# Patient Record
Sex: Male | Born: 1955 | Race: Black or African American | Hispanic: No | Marital: Single | Smoking: Current every day smoker
Health system: Southern US, Community
[De-identification: ages and names within clinical notes are randomized; demographics above are authoritative.]

## PROBLEM LIST (undated history)

## (undated) DIAGNOSIS — H269 Unspecified cataract: Secondary | ICD-10-CM

## (undated) DIAGNOSIS — R569 Unspecified convulsions: Secondary | ICD-10-CM

## (undated) DIAGNOSIS — C801 Malignant (primary) neoplasm, unspecified: Secondary | ICD-10-CM

## (undated) DIAGNOSIS — G4730 Sleep apnea, unspecified: Secondary | ICD-10-CM

## (undated) DIAGNOSIS — K219 Gastro-esophageal reflux disease without esophagitis: Secondary | ICD-10-CM

## (undated) DIAGNOSIS — C61 Malignant neoplasm of prostate: Secondary | ICD-10-CM

## (undated) DIAGNOSIS — E119 Type 2 diabetes mellitus without complications: Secondary | ICD-10-CM

## (undated) DIAGNOSIS — K439 Ventral hernia without obstruction or gangrene: Secondary | ICD-10-CM

## (undated) DIAGNOSIS — Z8711 Personal history of peptic ulcer disease: Secondary | ICD-10-CM

## (undated) DIAGNOSIS — I10 Essential (primary) hypertension: Secondary | ICD-10-CM

## (undated) DIAGNOSIS — F419 Anxiety disorder, unspecified: Secondary | ICD-10-CM

## (undated) DIAGNOSIS — I5032 Chronic diastolic (congestive) heart failure: Secondary | ICD-10-CM

## (undated) DIAGNOSIS — M1990 Unspecified osteoarthritis, unspecified site: Secondary | ICD-10-CM

## (undated) DIAGNOSIS — Z8719 Personal history of other diseases of the digestive system: Secondary | ICD-10-CM

## (undated) HISTORY — DX: Anxiety disorder, unspecified: F41.9

## (undated) HISTORY — DX: Unspecified cataract: H26.9

## (undated) HISTORY — PX: PROSTATE SURGERY: SHX751

## (undated) HISTORY — PX: EYE SURGERY: SHX253

## (undated) HISTORY — DX: Unspecified convulsions: R56.9

## (undated) HISTORY — DX: Malignant neoplasm of prostate: C61

## (undated) HISTORY — DX: Personal history of peptic ulcer disease: Z87.11

## (undated) HISTORY — PX: HERNIA REPAIR: SHX51

## (undated) HISTORY — DX: Ventral hernia without obstruction or gangrene: K43.9

## (undated) HISTORY — DX: Sleep apnea, unspecified: G47.30

## (undated) HISTORY — DX: Personal history of other diseases of the digestive system: Z87.19

---

## 2006-12-24 ENCOUNTER — Emergency Department: Payer: Self-pay | Admitting: Emergency Medicine

## 2007-04-28 ENCOUNTER — Emergency Department (HOSPITAL_COMMUNITY): Admission: EM | Admit: 2007-04-28 | Discharge: 2007-04-29 | Payer: Self-pay | Admitting: Emergency Medicine

## 2007-05-01 ENCOUNTER — Emergency Department (HOSPITAL_COMMUNITY): Admission: EM | Admit: 2007-05-01 | Discharge: 2007-05-02 | Payer: Self-pay | Admitting: Emergency Medicine

## 2007-05-01 ENCOUNTER — Emergency Department: Payer: Self-pay | Admitting: Emergency Medicine

## 2007-05-06 ENCOUNTER — Emergency Department: Payer: Self-pay | Admitting: Emergency Medicine

## 2007-05-08 ENCOUNTER — Emergency Department (HOSPITAL_COMMUNITY): Admission: EM | Admit: 2007-05-08 | Discharge: 2007-05-08 | Payer: Self-pay | Admitting: Emergency Medicine

## 2008-08-24 ENCOUNTER — Emergency Department (HOSPITAL_COMMUNITY): Admission: EM | Admit: 2008-08-24 | Discharge: 2008-08-24 | Payer: Self-pay | Admitting: Emergency Medicine

## 2008-08-26 ENCOUNTER — Emergency Department (HOSPITAL_COMMUNITY): Admission: EM | Admit: 2008-08-26 | Discharge: 2008-08-27 | Payer: Self-pay | Admitting: Emergency Medicine

## 2008-09-02 ENCOUNTER — Emergency Department: Payer: Self-pay | Admitting: Emergency Medicine

## 2008-10-05 ENCOUNTER — Emergency Department (HOSPITAL_COMMUNITY): Admission: EM | Admit: 2008-10-05 | Discharge: 2008-10-05 | Payer: Self-pay | Admitting: Emergency Medicine

## 2008-10-11 ENCOUNTER — Inpatient Hospital Stay: Payer: Self-pay | Admitting: Psychiatry

## 2011-08-16 LAB — DIFFERENTIAL
Basophils Absolute: 0
Basophils Absolute: 0
Basophils Relative: 0
Basophils Relative: 0
Eosinophils Absolute: 0.4
Eosinophils Absolute: 0.7
Eosinophils Relative: 11 — ABNORMAL HIGH
Eosinophils Relative: 4
Lymphocytes Relative: 22
Lymphs Abs: 1.3
Monocytes Absolute: 0.5
Monocytes Absolute: 0.7
Monocytes Relative: 8
Monocytes Relative: 8
Neutro Abs: 3.7
Neutrophils Relative %: 59

## 2011-08-16 LAB — CBC
HCT: 41.3
HCT: 42.7
Hemoglobin: 13.9
Hemoglobin: 14.4
MCHC: 33.8
MCHC: 33.8
MCV: 88.3
MCV: 89.2
Platelets: 177
RBC: 4.63
RBC: 4.84
RDW: 14.9
RDW: 15.2
WBC: 6.2

## 2011-08-16 LAB — POCT I-STAT, CHEM 8
BUN: 8
Calcium, Ion: 1.16
Chloride: 101
Creatinine, Ser: 1.2
Glucose, Bld: 81
HCT: 43
Hemoglobin: 14.6
Potassium: 3.7
Sodium: 140
TCO2: 30

## 2011-08-16 LAB — URINALYSIS, ROUTINE W REFLEX MICROSCOPIC
Glucose, UA: NEGATIVE
Hgb urine dipstick: NEGATIVE
Ketones, ur: 15 — AB
Nitrite: NEGATIVE
Protein, ur: NEGATIVE
Specific Gravity, Urine: 1.028
Urobilinogen, UA: 1
pH: 5.5

## 2011-08-16 LAB — RAPID URINE DRUG SCREEN, HOSP PERFORMED
Amphetamines: NOT DETECTED
Barbiturates: NOT DETECTED
Benzodiazepines: NOT DETECTED
Cocaine: POSITIVE — AB
Cocaine: POSITIVE — AB
Opiates: POSITIVE — AB
Tetrahydrocannabinol: POSITIVE — AB
Tetrahydrocannabinol: POSITIVE — AB

## 2011-08-16 LAB — URINE MICROSCOPIC-ADD ON

## 2011-08-16 LAB — BASIC METABOLIC PANEL
CO2: 31
Chloride: 101
GFR calc Af Amer: >60
Glucose, Bld: 89
Sodium: 140

## 2011-08-16 LAB — ETHANOL: Alcohol, Ethyl (B): 30 — ABNORMAL HIGH

## 2011-08-17 LAB — DIFFERENTIAL
Basophils Relative: 0
Eosinophils Absolute: 0.3
Eosinophils Relative: 5
Lymphs Abs: 2.1

## 2011-08-17 LAB — POCT I-STAT, CHEM 8
BUN: 7
Creatinine, Ser: 1.3
Glucose, Bld: 87
Potassium: 4
Sodium: 141
TCO2: 34

## 2011-08-17 LAB — RAPID URINE DRUG SCREEN, HOSP PERFORMED
Amphetamines: NOT DETECTED
Barbiturates: NOT DETECTED
Benzodiazepines: NOT DETECTED
Cocaine: POSITIVE — AB
Opiates: POSITIVE — AB

## 2011-08-17 LAB — CBC
HCT: 43.1
MCHC: 34.1
MCV: 87.7
Platelets: 164
WBC: 6.6

## 2011-08-17 LAB — CK TOTAL AND CKMB (NOT AT ARMC): Total CK: 633 — ABNORMAL HIGH

## 2011-09-01 LAB — I-STAT 8, (EC8 V) (CONVERTED LAB)
Acid-Base Excess: 4 — ABNORMAL HIGH
Acid-Base Excess: 6 — ABNORMAL HIGH
BUN: 14
BUN: 9
Bicarbonate: 31.7 — ABNORMAL HIGH
Bicarbonate: 34.3 — ABNORMAL HIGH
Chloride: 104
Chloride: 106
Glucose, Bld: 76
Glucose, Bld: 86
HCT: 35 — ABNORMAL LOW
HCT: 42
Hemoglobin: 11.9 — ABNORMAL LOW
Hemoglobin: 14.3
Operator id: 277751
Operator id: 277751
Potassium: 3.7
Potassium: 4.1
Sodium: 140
Sodium: 142
TCO2: 34
TCO2: 36
pCO2, Ven: 64.1 — ABNORMAL HIGH
pCO2, Ven: 66.9 — ABNORMAL HIGH
pH, Ven: 7.303 — ABNORMAL HIGH
pH, Ven: 7.318 — ABNORMAL HIGH

## 2011-09-01 LAB — HEPATIC FUNCTION PANEL
ALT: 182 — ABNORMAL HIGH
AST: 113 — ABNORMAL HIGH
Albumin: 3.4 — ABNORMAL LOW
Alkaline Phosphatase: 119 — ABNORMAL HIGH
Bilirubin, Direct: 0.4 — ABNORMAL HIGH
Indirect Bilirubin: 0.8
Total Bilirubin: 1.2
Total Protein: 7.2

## 2011-09-01 LAB — CBC
HCT: 34.2 — ABNORMAL LOW
HCT: 40.8
Hemoglobin: 13.4
MCHC: 32.9
MCV: 87.1
MCV: 87.7
Platelets: 170
RBC: 3.92 — ABNORMAL LOW
RBC: 4.65
RDW: 15 — ABNORMAL HIGH
WBC: 4.7
WBC: 4.9

## 2011-09-01 LAB — RAPID URINE DRUG SCREEN, HOSP PERFORMED
Amphetamines: NOT DETECTED
Amphetamines: NOT DETECTED
Barbiturates: NOT DETECTED
Benzodiazepines: POSITIVE — AB
Cocaine: NOT DETECTED
Opiates: POSITIVE — AB
Tetrahydrocannabinol: POSITIVE — AB
Tetrahydrocannabinol: POSITIVE — AB

## 2011-09-01 LAB — DIFFERENTIAL
Basophils Absolute: 0
Basophils Relative: 1
Eosinophils Absolute: 0.3
Eosinophils Absolute: 0.5
Eosinophils Relative: 10 — ABNORMAL HIGH
Eosinophils Relative: 6 — ABNORMAL HIGH
Lymphocytes Relative: 41
Lymphs Abs: 1.8
Lymphs Abs: 1.9
Monocytes Absolute: 0.4
Monocytes Absolute: 0.5
Monocytes Relative: 12 — ABNORMAL HIGH
Monocytes Relative: 8
Neutro Abs: 1.9
Neutrophils Relative %: 40 — ABNORMAL LOW

## 2011-09-01 LAB — SALICYLATE LEVEL: Salicylate Lvl: <4

## 2011-09-01 LAB — POCT I-STAT CREATININE
Creatinine, Ser: 1.1
Operator id: 277751

## 2011-09-01 LAB — TRICYCLICS SCREEN, URINE: TCA Scrn: NOT DETECTED

## 2011-09-01 LAB — ETHANOL: Alcohol, Ethyl (B): <5

## 2011-09-02 LAB — BASIC METABOLIC PANEL
CO2: 30
Calcium: 9.5
Chloride: 108
Glucose, Bld: 92
Sodium: 144

## 2011-09-02 LAB — DIFFERENTIAL
Basophils Absolute: 0
Basophils Relative: 1
Eosinophils Absolute: 0.5
Eosinophils Relative: 7 — ABNORMAL HIGH
Monocytes Absolute: 0.6
Neutro Abs: 4

## 2011-09-02 LAB — CBC
Hemoglobin: 12.9 — ABNORMAL LOW
MCHC: 33.5
MCV: 86.1
RDW: 14.4 — ABNORMAL HIGH

## 2011-09-02 LAB — URINALYSIS, ROUTINE W REFLEX MICROSCOPIC
Glucose, UA: NEGATIVE
Nitrite: NEGATIVE
Specific Gravity, Urine: 1.023
pH: 5.5

## 2011-09-02 LAB — RAPID URINE DRUG SCREEN, HOSP PERFORMED
Cocaine: NOT DETECTED
Tetrahydrocannabinol: POSITIVE — AB

## 2013-11-15 DIAGNOSIS — K439 Ventral hernia without obstruction or gangrene: Secondary | ICD-10-CM

## 2013-11-15 HISTORY — DX: Ventral hernia without obstruction or gangrene: K43.9

## 2014-11-15 HISTORY — PX: PROSTATE SURGERY: SHX751

## 2017-09-12 ENCOUNTER — Emergency Department: Payer: Self-pay

## 2017-09-12 ENCOUNTER — Emergency Department
Admission: EM | Admit: 2017-09-12 | Discharge: 2017-09-12 | Disposition: A | Discharge status: Home / Self Care | Payer: Self-pay | Attending: Emergency Medicine | Admitting: Emergency Medicine

## 2017-09-12 ENCOUNTER — Encounter: Payer: Self-pay | Admitting: Emergency Medicine

## 2017-09-12 DIAGNOSIS — Y929 Unspecified place or not applicable: Secondary | ICD-10-CM | POA: Insufficient documentation

## 2017-09-12 DIAGNOSIS — I10 Essential (primary) hypertension: Secondary | ICD-10-CM | POA: Insufficient documentation

## 2017-09-12 DIAGNOSIS — S8002XA Contusion of left knee, initial encounter: Secondary | ICD-10-CM | POA: Insufficient documentation

## 2017-09-12 DIAGNOSIS — Z859 Personal history of malignant neoplasm, unspecified: Secondary | ICD-10-CM | POA: Insufficient documentation

## 2017-09-12 DIAGNOSIS — Y939 Activity, unspecified: Secondary | ICD-10-CM | POA: Insufficient documentation

## 2017-09-12 DIAGNOSIS — F17200 Nicotine dependence, unspecified, uncomplicated: Secondary | ICD-10-CM | POA: Insufficient documentation

## 2017-09-12 DIAGNOSIS — M25562 Pain in left knee: Secondary | ICD-10-CM

## 2017-09-12 DIAGNOSIS — M25561 Pain in right knee: Secondary | ICD-10-CM

## 2017-09-12 DIAGNOSIS — W19XXXA Unspecified fall, initial encounter: Secondary | ICD-10-CM | POA: Insufficient documentation

## 2017-09-12 DIAGNOSIS — Y999 Unspecified external cause status: Secondary | ICD-10-CM | POA: Insufficient documentation

## 2017-09-12 HISTORY — DX: Essential (primary) hypertension: I10

## 2017-09-12 HISTORY — DX: Malignant (primary) neoplasm, unspecified: C80.1

## 2017-09-12 MED ORDER — TRAMADOL HCL 50 MG PO TABS
50.0000 mg | ORAL_TABLET | Freq: Once | ORAL | Status: AC
  Administered 2017-09-12: 50 mg via ORAL
  Filled 2017-09-12: qty 1

## 2017-09-12 MED ORDER — TRAMADOL HCL 50 MG PO TABS: 50.0000 mg | ORAL_TABLET | Freq: Four times a day (QID) | ORAL | 0 refills | Status: AC | PRN

## 2017-09-12 NOTE — ED Notes (Signed)
Pt to ed with c/o left lower leg pain. Pt states on Saturday he tripped and fell onto sidewalk and twisted left knee.  Reports since increased pain with ambulation, swelling to back of calf.

## 2017-09-12 NOTE — ED Provider Notes (Signed)
[facility] Emergency Department Provider Note    First MD Initiated Contact with Patient 09/12/17 0915     (approximate)  I have reviewed the triage vital signs and the nursing notes.   HISTORY  Chief Complaint Knee Pain    HPI Alexander Avery is a 61 y.o. male with blow list of chronic medical conditions presents to the emergency department with left lateral knee pain status post accidental fall on Saturday where the patient fell onto his left knee. Patient states his current pain score is 9 out of 10 worse with any movement.   Past Medical History:  Diagnosis Date  . Cancer ([place])   . Hypertension     There are no active problems to display for this patient.   Past Surgical History:  Procedure Laterality Date  . HERNIA REPAIR      Prior to Admission medications   Not on File    Allergies No known drug allergies No family history on file.  Social History Social History  Substance Use Topics  . Smoking status: Current Some Day Smoker  . Smokeless tobacco: Never Used  . Alcohol use Yes    Review of Systems Constitutional: No fever/chills Eyes: No visual changes. ENT: No sore throat. Cardiovascular: Denies chest pain. Respiratory: Denies shortness of breath. Gastrointestinal: No abdominal pain.  No nausea, no vomiting.  No diarrhea.  No constipation. Genitourinary: Negative for dysuria. Musculoskeletal: Negative for neck pain.  Negative for back pain. Positive for left knee pain Integumentary: Negative for rash. Neurological: Negative for headaches, focal weakness or numbness.   ____________________________________________   PHYSICAL EXAM:  VITAL SIGNS: ED Triage Vitals  Enc Vitals Group     BP 09/12/17 0835 (!) 197/88     Pulse Rate 09/12/17 0835 72     Resp 09/12/17 0835 20     Temp 09/12/17 0835 97.8 F (36.6 C)     Temp Source 09/12/17 0835 Oral     SpO2 09/12/17 0835 99 %     Weight 09/12/17 0837 57.2 kg (126  lb)     Height --      Head Circumference --      Peak Flow --      Pain Score 09/12/17 0835 9     Pain Loc --      Pain Edu? --      Excl. in [place]? --     Constitutional: Alert and oriented. Well appearing and in no acute distress. Eyes: Conjunctivae are normal.  Head: Atraumatic. Mouth/Throat: Mucous membranes are moist.  Neck: No stridor.   Musculoskeletal: Pain swelling with palpation of the left fibular head, pain active and passive range of motion of the left knee. Neurologic:  Normal speech and language. No gross focal neurologic deficits are appreciated.  Skin:  Skin is warm, dry and intact. No rash noted. Psychiatric: Mood and affect are normal. Speech and behavior are normal.  ____________________________________________   RADIOLOGY I, Ponce N BROWN, personally viewed and evaluated these images (plain radiographs) as part of my medical decision making, as well as reviewing the written report by the radiologist.  Final result by Inez Catalina, MD (09/12/17 10:43:19)           Narrative:   CLINICAL DATA: Recent fall with left knee pain, initial encounter  EXAM: LEFT KNEE - COMPLETE 4+ VIEW  COMPARISON: None.  FINDINGS: No acute fracture or dislocation is noted. Some slight increased density in the infrapatellar fat pad is noted  which may be related to internal derangement. Vascular calcifications are seen.  IMPRESSION: No acute bony abnormality is seen.   Electronically Signed By: Inez Catalina M.D. On: 09/12/2017 10:43           Procedures   ____________________________________________   INITIAL IMPRESSION / ASSESSMENT AND PLAN / ED COURSE  As part of my medical decision making, I reviewed the following data within the electronic MEDICAL RECORD [id number] year old male presenting with above stated history and physical exam. X-ray revealed no acute bony abnormality. Concern for possible ligamentous injury and a such a knee immobilizer placed on  the patient crutches given patient given tramadol emergency department for pain will be prescribed same for home. Patient referred to Dr. Marry Guan orthopedic surgeon for further outpatient evaluation for possible ligamentous injury.     ____________________________________________  FINAL CLINICAL IMPRESSION(S) / ED DIAGNOSES  Final diagnoses:  Acute pain of right knee  Contusion of left knee, initial encounter  Acute pain of left knee     MEDICATIONS GIVEN DURING THIS VISIT:  Medications - No data to display   NEW OUTPATIENT MEDICATIONS STARTED DURING THIS VISIT:  New Prescriptions   No medications on file    Modified Medications   No medications on file    Discontinued Medications   No medications on file     Note:  This document was prepared using Dragon voice recognition software and may include unintentional dictation errors.    Gregor Hams, MD 09/14/17 [phone]

## 2017-09-12 NOTE — ED Triage Notes (Signed)
Pt presents with left knee pain after missing a step on Saturday and landing on his left knee.

## 2017-11-15 DIAGNOSIS — H269 Unspecified cataract: Secondary | ICD-10-CM

## 2017-11-15 HISTORY — DX: Unspecified cataract: H26.9

## 2018-03-28 ENCOUNTER — Encounter: Payer: Self-pay | Admitting: *Deleted

## 2018-03-30 ENCOUNTER — Ambulatory Visit
Admission: RE | Admit: 2018-03-30 | Discharge: 2018-03-30 | Disposition: A | Discharge status: Home / Self Care | Payer: Medicare Other | Source: Ambulatory Visit | Attending: Ophthalmology | Admitting: Ophthalmology

## 2018-03-30 ENCOUNTER — Encounter
Admission: RE | Disposition: A | Discharge status: Home / Self Care | Payer: Self-pay | Source: Ambulatory Visit | Attending: Ophthalmology

## 2018-03-30 ENCOUNTER — Ambulatory Visit: Payer: Medicare Other | Admitting: Registered Nurse

## 2018-03-30 DIAGNOSIS — Z7984 Long term (current) use of oral hypoglycemic drugs: Secondary | ICD-10-CM | POA: Diagnosis not present

## 2018-03-30 DIAGNOSIS — Z79899 Other long term (current) drug therapy: Secondary | ICD-10-CM | POA: Diagnosis not present

## 2018-03-30 DIAGNOSIS — K219 Gastro-esophageal reflux disease without esophagitis: Secondary | ICD-10-CM | POA: Insufficient documentation

## 2018-03-30 DIAGNOSIS — I10 Essential (primary) hypertension: Secondary | ICD-10-CM | POA: Insufficient documentation

## 2018-03-30 DIAGNOSIS — H2511 Age-related nuclear cataract, right eye: Secondary | ICD-10-CM | POA: Insufficient documentation

## 2018-03-30 DIAGNOSIS — Z8546 Personal history of malignant neoplasm of prostate: Secondary | ICD-10-CM | POA: Insufficient documentation

## 2018-03-30 DIAGNOSIS — F17200 Nicotine dependence, unspecified, uncomplicated: Secondary | ICD-10-CM | POA: Diagnosis not present

## 2018-03-30 DIAGNOSIS — E1136 Type 2 diabetes mellitus with diabetic cataract: Secondary | ICD-10-CM | POA: Diagnosis present

## 2018-03-30 DIAGNOSIS — H2521 Age-related cataract, morgagnian type, right eye: Secondary | ICD-10-CM | POA: Diagnosis not present

## 2018-03-30 HISTORY — DX: Gastro-esophageal reflux disease without esophagitis: K21.9

## 2018-03-30 HISTORY — PX: CATARACT EXTRACTION W/PHACO: SHX586

## 2018-03-30 HISTORY — DX: Unspecified osteoarthritis, unspecified site: M19.90

## 2018-03-30 HISTORY — DX: Type 2 diabetes mellitus without complications: E11.9

## 2018-03-30 LAB — GLUCOSE, CAPILLARY: GLUCOSE-CAPILLARY: 94 mg/dL (ref 65–99)

## 2018-03-30 SURGERY — PHACOEMULSIFICATION, CATARACT, WITH IOL INSERTION
Anesthesia: Monitor Anesthesia Care | Site: Eye | Laterality: Right | Wound class: Clean

## 2018-03-30 MED ORDER — NA CHONDROIT SULF-NA HYALURON 40-30 MG/ML IO SOLN
INTRAOCULAR | Status: DC | PRN
  Administered 2018-03-30 (×2): 0.5 mL via INTRAOCULAR

## 2018-03-30 MED ORDER — FLUORESCEIN SODIUM 1 MG OP STRP
ORAL_STRIP | OPHTHALMIC | Status: DC | PRN
  Administered 2018-03-30: 1 via OPHTHALMIC

## 2018-03-30 MED ORDER — POVIDONE-IODINE 5 % OP SOLN
OPHTHALMIC | Status: AC
  Filled 2018-03-30: qty 30

## 2018-03-30 MED ORDER — SODIUM HYALURONATE 10 MG/ML IO SOLN
INTRAOCULAR | Status: DC | PRN
  Administered 2018-03-30: 0.55 mL via INTRAOCULAR

## 2018-03-30 MED ORDER — NEOMYCIN-POLYMYXIN-DEXAMETH 3.5-10000-0.1 OP OINT
TOPICAL_OINTMENT | OPHTHALMIC | Status: AC
  Filled 2018-03-30: qty 3.5

## 2018-03-30 MED ORDER — BSS IO SOLN
INTRAOCULAR | Status: DC | PRN
  Administered 2018-03-30: 4 mL via OPHTHALMIC

## 2018-03-30 MED ORDER — TRIAMCINOLONE ACETONIDE 40 MG/ML IJ SUSP
INTRAMUSCULAR | Status: AC
  Filled 2018-03-30: qty 1

## 2018-03-30 MED ORDER — NEOMYCIN-POLYMYXIN-DEXAMETH 3.5-10000-0.1 OP OINT
TOPICAL_OINTMENT | OPHTHALMIC | Status: DC | PRN
  Administered 2018-03-30: 1 via OPHTHALMIC

## 2018-03-30 MED ORDER — EPINEPHRINE PF 1 MG/ML IJ SOLN
INTRAMUSCULAR | Status: AC
  Filled 2018-03-30: qty 2

## 2018-03-30 MED ORDER — POVIDONE-IODINE 5 % OP SOLN
OPHTHALMIC | Status: DC | PRN
  Administered 2018-03-30: 1 via OPHTHALMIC

## 2018-03-30 MED ORDER — HYALURONIDASE HUMAN 150 UNIT/ML IJ SOLN
INTRAMUSCULAR | Status: AC
  Filled 2018-03-30: qty 1

## 2018-03-30 MED ORDER — ARMC OPHTHALMIC DILATING DROPS
1.0000 "application " | OPHTHALMIC | Status: AC
  Administered 2018-03-30 (×3): 1 via OPHTHALMIC

## 2018-03-30 MED ORDER — SODIUM CHLORIDE 0.9 % IV SOLN
INTRAVENOUS | Status: DC
  Administered 2018-03-30: 07:00:00 via INTRAVENOUS

## 2018-03-30 MED ORDER — MIDAZOLAM HCL 2 MG/2ML IJ SOLN
INTRAMUSCULAR | Status: AC
Start: 2018-03-30 — End: ?
  Filled 2018-03-30: qty 2

## 2018-03-30 MED ORDER — LIDOCAINE HCL (PF) 4 % IJ SOLN
INTRAMUSCULAR | Status: AC
  Filled 2018-03-30: qty 5

## 2018-03-30 MED ORDER — ARMC OPHTHALMIC DILATING DROPS
OPHTHALMIC | Status: AC
  Administered 2018-03-30: 07:00:00
  Filled 2018-03-30: qty 0.4

## 2018-03-30 MED ORDER — LIDOCAINE HCL (PF) 4 % IJ SOLN
INTRAMUSCULAR | Status: DC | PRN
  Administered 2018-03-30: 08:00:00

## 2018-03-30 MED ORDER — TRIAMCINOLONE ACETONIDE 40 MG/ML IJ SUSP
INTRAMUSCULAR | Status: DC | PRN
  Administered 2018-03-30: 40 mg via INTRAMUSCULAR

## 2018-03-30 MED ORDER — SODIUM HYALURONATE 23 MG/ML IO SOLN
INTRAOCULAR | Status: AC
  Filled 2018-03-30: qty 0.6

## 2018-03-30 MED ORDER — TRYPAN BLUE 0.06 % OP SOLN
OPHTHALMIC | Status: DC | PRN
  Administered 2018-03-30: 0.5 mL via INTRAOCULAR

## 2018-03-30 MED ORDER — EPINEPHRINE PF 1 MG/ML IJ SOLN
INTRAOCULAR | Status: DC | PRN
  Administered 2018-03-30: 08:00:00 via OPHTHALMIC

## 2018-03-30 MED ORDER — ONDANSETRON HCL 4 MG/2ML IJ SOLN
INTRAMUSCULAR | Status: DC | PRN
  Administered 2018-03-30: 4 mg via INTRAVENOUS

## 2018-03-30 MED ORDER — ALFENTANIL 500 MCG/ML IJ INJ
INJECTION | INTRAVENOUS | Status: DC | PRN
  Administered 2018-03-30 (×2): 250 ug via INTRAVENOUS

## 2018-03-30 MED ORDER — BSS IO SOLN
INTRAOCULAR | Status: DC | PRN
  Administered 2018-03-30 (×2): 15 mL via INTRAOCULAR

## 2018-03-30 MED ORDER — MIDAZOLAM HCL 2 MG/2ML IJ SOLN
INTRAMUSCULAR | Status: DC | PRN
  Administered 2018-03-30: 2 mg via INTRAVENOUS

## 2018-03-30 MED ORDER — BUPIVACAINE HCL (PF) 0.75 % IJ SOLN
INTRAMUSCULAR | Status: AC
  Filled 2018-03-30: qty 10

## 2018-03-30 MED ORDER — FLUORESCEIN SODIUM 0.6 MG OP STRP
ORAL_STRIP | OPHTHALMIC | Status: AC
  Filled 2018-03-30: qty 1

## 2018-03-30 MED ORDER — FENTANYL CITRATE (PF) 100 MCG/2ML IJ SOLN
INTRAMUSCULAR | Status: AC
Start: 2018-03-30 — End: ?
  Filled 2018-03-30: qty 2

## 2018-03-30 MED ORDER — MOXIFLOXACIN HCL 0.5 % OP SOLN
OPHTHALMIC | Status: AC
  Filled 2018-03-30: qty 3

## 2018-03-30 MED ORDER — SODIUM HYALURONATE 23 MG/ML IO SOLN
INTRAOCULAR | Status: DC | PRN
  Administered 2018-03-30: 0.6 mL via INTRAOCULAR

## 2018-03-30 MED ORDER — CARBACHOL 0.01 % IO SOLN
INTRAOCULAR | Status: DC | PRN
  Administered 2018-03-30: 0.5 mL via INTRAOCULAR

## 2018-03-30 MED ORDER — PROPOFOL 10 MG/ML IV BOLUS
INTRAVENOUS | Status: AC
  Filled 2018-03-30: qty 20

## 2018-03-30 MED ORDER — TRYPAN BLUE 0.06 % OP SOLN
OPHTHALMIC | Status: AC
Start: 2018-03-30 — End: ?
  Filled 2018-03-30: qty 0.5

## 2018-03-30 SURGICAL SUPPLY — 31 items
BANDAGE EYE OVAL (MISCELLANEOUS) ×2 IMPLANT
CANNULA ANT/CHMB 27G (MISCELLANEOUS) IMPLANT
CANNULA ANT/CHMB 27GA (MISCELLANEOUS) ×4 IMPLANT
DISSECTOR HYDRO NUCLEUS 50X22 (MISCELLANEOUS) ×2 IMPLANT
FILTER MILLEX .045 (MISCELLANEOUS) IMPLANT
FLTR MILLEX .045 (MISCELLANEOUS) ×2
GLIDE IOL SHEET 35MLX5ML (MISCELLANEOUS) ×1 IMPLANT
GLOVE BIO SURGEON STRL SZ8 (GLOVE) ×2 IMPLANT
GLOVE BIOGEL M 6.5 STRL (GLOVE) ×2 IMPLANT
GLOVE SURG LX 7.5 STRW (GLOVE) ×1
GLOVE SURG LX STRL 7.5 STRW (GLOVE) ×1 IMPLANT
GOWN STRL REUS W/ TWL LRG LVL3 (GOWN DISPOSABLE) ×2 IMPLANT
GOWN STRL REUS W/TWL LRG LVL3 (GOWN DISPOSABLE) ×4
LABEL CATARACT MEDS ST (LABEL) ×2 IMPLANT
LENS IOL +15.5 DIOP 13X5.5X (Intraocular Lens) ×1 IMPLANT
LENS IOL KELMAN MF 15.5 (Intraocular Lens) IMPLANT
LENS IOL KELMAN MULTIFLEX 15.5 (Intraocular Lens) ×2 IMPLANT
LENS IOL TECNIS ITEC 20.0 (Intraocular Lens) IMPLANT
MARKER SKIN DUAL TIP RULER LAB (MISCELLANEOUS) ×1 IMPLANT
NDL SAFETY ECLIPSE 18X1.5 (NEEDLE) IMPLANT
NEEDLE HYPO 18GX1.5 SHARP (NEEDLE) ×2
PACK CATARACT (MISCELLANEOUS) ×2 IMPLANT
PACK CATARACT KING (MISCELLANEOUS) ×2 IMPLANT
PACK EYE AFTER SURG (MISCELLANEOUS) ×2 IMPLANT
PACK VIT ANT 23G (MISCELLANEOUS) ×1 IMPLANT
SOL BSS BAG (MISCELLANEOUS) ×2
SOLUTION BSS BAG (MISCELLANEOUS) ×1 IMPLANT
SUT ETHILON 10 0 CS140 6 (SUTURE) ×1 IMPLANT
SYR 3ML LL SCALE MARK (SYRINGE) ×2 IMPLANT
WATER STERILE IRR 250ML POUR (IV SOLUTION) ×2 IMPLANT
WIPE NON LINTING 3.25X3.25 (MISCELLANEOUS) ×2 IMPLANT

## 2018-03-30 NOTE — Anesthesia Post-op Follow-up Note (Signed)
Anesthesia QCDR form completed.        

## 2018-03-30 NOTE — Addendum Note (Signed)
Addendum  created 03/30/18 0906 by Doreen Salvage, CRNA   Child order released for a procedure order, Intraprocedure Blocks edited, Sign clinical note

## 2018-03-30 NOTE — Transfer of Care (Signed)
Immediate Anesthesia Transfer of Care Note  Patient: ISHAN SANROMAN  Procedure(s) Performed: Procedure(s) with comments: CATARACT EXTRACTION PHACO AND INTRAOCULAR LENS PLACEMENT (IOC) (Right) - [place] 01:09.5 AP% 10.4 CDE 7.98 Fluid pack lot # [id number]  Patient Location: PHASE II  Anesthesia Type:MAC  Level of Consciousness: Awake, Alert, Oriented  Airway & Oxygen Therapy: Patient Spontanous Breathing and Patient on room air   Post-op Assessment: Report given to RN and Post -op Vital signs reviewed and stable  Post vital signs: Reviewed and stable  Last Vitals:  Vitals:   03/30/18 0633 03/30/18 0857  BP: (!) 167/87 (!) 148/83  Pulse: (!) 55 (!) 58  Resp: 18 18  Temp: 36.5 C 36.6 C  SpO2: 015% 615%    Complications: No apparent anesthesia complications

## 2018-03-30 NOTE — Op Note (Signed)
OPERATIVE NOTE  CREE KUNERT [id number] 03/30/2018   PREOPERATIVE DIAGNOSIS:  Nuclear sclerotic cataract right eye.  H25.11, Morgagnian cataract right eye.   POSTOPERATIVE DIAGNOSIS:    Nuclear sclerotic cataract right eye.     PROCEDURE:   1.  Phacoemusification with anterior chamber intraocular lens placement of the right eye  2.  Anterior vitrectomy.    LENS:   Implant Name Type Inv. Item Serial No. Manufacturer Lot No. LRB No. Used  LENS IOL DIOP 20.0 - [id number] Intraocular Lens LENS IOL DIOP 20.0 [id number]  Right 1  LENS IOL ANT DIOP 15.5 - [id number] Intraocular Lens LENS IOL ANT DIOP 15.5 [id number] ALCON  Right 1       The MTA 4UO anterior chamber lens was used.  Not the AMO lens.   ULTRASOUND TIME: 1 minutes 09 seconds.  CDE 7.98   SURGEON:  Benay Pillow, MD, MPH  ANESTHESIOLOGIST: Anesthesiologist: Martha Clan, MD CRNA: Doreen Salvage, CRNA   ANESTHESIA:  MAC, retrobulbar block, topical with tetracaine drops augmented with 1% preservative-free intracameral lidocaine.  ESTIMATED BLOOD LOSS: less than 1 mL.   COMPLICATIONS:  Anterior and posterior capsular tear.   DESCRIPTION OF PROCEDURE:  The patient was identified in the holding room and transported to the operating room and placed in the supine position under the operating microscope.    A retrobulbar block was performed in the standard fashion with 3.5 cc.  The right eye was identified as the operative eye and it was prepped and draped in the usual sterile ophthalmic fashion.   A 1.0 millimeter clear-corneal paracentesis was made at the 10:30 position.   The cataract was opaque so trypan blue was utilized in the standard fashion to visualize the capsule.  0.5 ml of preservative-free 1% lidocaine with epinephrine was injected into the anterior chamber.  The anterior chamber was filled with Healon 5 viscoelastic.  A 2.4 millimeter keratome was used to make a near-clear corneal incision at  the 8:00 position.  A curvilinear capsulorrhexis was made with a cystotome and capsulorrhexis forceps.  Balanced salt solution was used to hydrodissect and hydrodelineate the nucleus.  The nucleus was noted to be very mobile, but the anterior capsule remained in good position, the posterior capsule was notably mobile.    Phacoemulsification was then used in stop and chop fashion to remove the lens nucleus and epinucleus.    A posterior and anterior capsule tear was suddenly noted as well as a zonular dehiscence inferiorly.  Viscoat was injected to tamponade the vitreous.  The rest of the lens was removed using I/A and phaco.   An anterior vitrectomy was performed, with kenalog used to stain the vitreous.  The vitrector was used to create an iridotomy.  The temporal wound was enlarged to 5 mm.   An anterior chamber lens was placed.   Four simple interrupted 10-0 nylon sutures were placed.   The OVD was rinsed out with BSS.   Intracameral vigamox 0.1 mL undiluted was injected into the eye and a drop placed onto the ocular surface.  No wound leaks were noted, this was verified with a fluorescein strip.   The patient was taken to the recovery room in stable condition.    Benay Pillow 03/30/2018, 8:57 AM

## 2018-03-30 NOTE — Addendum Note (Signed)
Addendum  created 03/30/18 0911 by Martha Clan, MD   Intraprocedure Event edited

## 2018-03-30 NOTE — Anesthesia Preprocedure Evaluation (Signed)
Anesthesia Evaluation  Patient identified by MRN, date of birth, ID band Patient awake    Reviewed: Allergy & Precautions, H&P , NPO status , Patient's Chart, lab work & pertinent test results, reviewed documented beta blocker date and time   History of Anesthesia Complications Negative for: history of anesthetic complications  Airway Mallampati: I  TM Distance: >3 FB Neck ROM: full    Dental  (+) Dental Advidsory Given, Teeth Intact, Poor Dentition, Missing   Pulmonary neg shortness of breath, neg sleep apnea, neg COPD, neg recent URI, Current Smoker,           Cardiovascular Exercise Tolerance: Good hypertension, (-) angina(-) CAD, (-) Past MI, (-) Cardiac Stents and (-) CABG (-) dysrhythmias (-) Valvular Problems/Murmurs     Neuro/Psych negative neurological ROS  negative psych ROS   GI/Hepatic Neg liver ROS, GERD  ,  Endo/Other  diabetes, Type 2, Oral Hypoglycemic Agents  Renal/GU negative Renal ROS  negative genitourinary   Musculoskeletal   Abdominal   Peds  Hematology negative hematology ROS (+)   Anesthesia Other Findings Past Medical History: No date: Arthritis No date: Cancer (HCC) No date: Diabetes mellitus without complication (HCC) No date: GERD (gastroesophageal reflux disease) No date: Hypertension   Reproductive/Obstetrics negative OB ROS                             Anesthesia Physical Anesthesia Plan  ASA: II  Anesthesia Plan: MAC   Post-op Pain Management:    Induction:   PONV Risk Score and Plan: 1 and Midazolam, Ondansetron and Dexamethasone  Airway Management Planned:   Additional Equipment:   Intra-op Plan:   Post-operative Plan:   Informed Consent: I have reviewed the patients History and Physical, chart, labs and discussed the procedure including the risks, benefits and alternatives for the proposed anesthesia with the patient or authorized  representative who has indicated his/her understanding and acceptance.   Dental Advisory Given  Plan Discussed with: Anesthesiologist, CRNA and Surgeon  Anesthesia Plan Comments:         Anesthesia Quick Evaluation

## 2018-03-30 NOTE — H&P (Signed)
The History and Physical notes are on paper, have been signed, and are to be scanned.   I have examined the patient and there are no changes to the H&P.   Benay Pillow 03/30/2018 7:19 AM

## 2018-03-30 NOTE — OR Nursing (Signed)
Discussed discharge instructions with pt and wife. Both voice understanding. 

## 2018-03-30 NOTE — Anesthesia Postprocedure Evaluation (Signed)
Anesthesia Post Note  Patient: Alexander Avery  Procedure(s) Performed: CATARACT EXTRACTION PHACO AND INTRAOCULAR LENS PLACEMENT (IOC) (Right Eye)  Patient location during evaluation: PACU Anesthesia Type: MAC Level of consciousness: awake and alert Pain management: pain level controlled Vital Signs Assessment: post-procedure vital signs reviewed and stable Respiratory status: spontaneous breathing, nonlabored ventilation, respiratory function stable and patient connected to nasal cannula oxygen Cardiovascular status: stable and blood pressure returned to baseline Postop Assessment: no apparent nausea or vomiting Anesthetic complications: no     Last Vitals:  Vitals:   03/30/18 0633 03/30/18 0857  BP: (!) 167/87 (!) 148/83  Pulse: (!) 55 (!) 58  Resp: 18 18  Temp: 36.5 C 36.6 C  SpO2: 100% 100%    Last Pain:  Vitals:   03/30/18 0857  TempSrc: Oral  PainSc:                  Alison Stalling

## 2018-03-30 NOTE — Discharge Instructions (Addendum)
FOLLOW DR. Melony Overly POSTOP EYE DROP INSTRUCTION SHEET AS REVIEWED.  Eye Surgery Discharge Instructions  Expect mild scratchy sensation or mild soreness. DO NOT RUB YOUR EYE!  The day of surgery:  Minimal physical activity, but bed rest is not required  No reading, computer work, or close hand work  No bending, lifting, or straining.  May watch TV  For 24 hours:  No driving, legal decisions, or alcoholic beverages  Safety precautions  Eat anything you prefer: It is better to start with liquids, then soup then solid foods.  Keep eye patch on until follow up appt tomorrow - take eye drop instruction sheet with you to follow up appt (Vigamox in bag)  Resume all regular medications including aspirin or Coumadin if these were discontinued prior to surgery. You may shower, bathe, shave, or wash your hair. Tylenol may be taken for mild discomfort.  Call your doctor if you experience significant pain, nausea, or vomiting, fever > 101 or other signs of infection. [phone] or [phone] Specific instructions:  Follow-up Information    Eulogio Bear, MD Follow up.   Specialty:  Ophthalmology Why:  as previously scheduled [facility] OFFICE Contact information: [place] [address] [phone]

## 2018-03-30 NOTE — Anesthesia Procedure Notes (Signed)
Date/Time: 03/30/2018 7:37 AM Performed by: Doreen Salvage, CRNA Pre-anesthesia Checklist: Patient identified, Emergency Drugs available, Suction available and Patient being monitored Patient Re-evaluated:Patient Re-evaluated prior to induction Oxygen Delivery Method: Nasal cannula Induction Type: IV induction Dental Injury: Teeth and Oropharynx as per pre-operative assessment  Comments: Nasal cannula with etCO2 monitoring

## 2018-06-15 NOTE — Pre-Procedure Instructions (Addendum)
VERIFIED PATIENT'S CORRECT FIRST NAME IS Alexander Avery. LM FOR [facility], NEED ORDERS, H/P WITH CORRECTED NAME. SPOKE WITH SUPERVISOR, ROB WHO CONFIRMED WILL NEED ORDERS/ H/P WITH CORRECT LEGAL FIRST NAME  TELEPHONE CALL FROM [facility]. STATES PATIENT THOUGHT HIS NAME WAS Alexander Avery UNTIL HE TOOK BIRTH CERTIFICATE TO GET MEDICARE. IT SHOWED HIS LEGAL FIRST NAME AS Alexander Avery. ROB IN REGISTRATION NOTIFIED AND IS CALLING PATIENT TO COME WITH IDENTIFICATION TO GET CHANGED IN OUR SYSTEM.

## 2018-06-21 ENCOUNTER — Encounter: Payer: Self-pay | Admitting: *Deleted

## 2018-06-28 ENCOUNTER — Other Ambulatory Visit: Payer: Self-pay

## 2018-06-28 ENCOUNTER — Emergency Department
Admission: EM | Admit: 2018-06-28 | Discharge: 2018-06-28 | Disposition: A | Discharge status: Home / Self Care | Payer: Medicare Other | Attending: Student in an Organized Health Care Education/Training Program | Admitting: Student in an Organized Health Care Education/Training Program

## 2018-06-28 ENCOUNTER — Emergency Department: Payer: Medicare Other

## 2018-06-28 DIAGNOSIS — I10 Essential (primary) hypertension: Secondary | ICD-10-CM | POA: Insufficient documentation

## 2018-06-28 DIAGNOSIS — R5383 Other fatigue: Secondary | ICD-10-CM | POA: Diagnosis not present

## 2018-06-28 DIAGNOSIS — F17210 Nicotine dependence, cigarettes, uncomplicated: Secondary | ICD-10-CM | POA: Diagnosis not present

## 2018-06-28 DIAGNOSIS — Z859 Personal history of malignant neoplasm, unspecified: Secondary | ICD-10-CM | POA: Diagnosis not present

## 2018-06-28 DIAGNOSIS — R42 Dizziness and giddiness: Secondary | ICD-10-CM | POA: Insufficient documentation

## 2018-06-28 DIAGNOSIS — E119 Type 2 diabetes mellitus without complications: Secondary | ICD-10-CM | POA: Diagnosis not present

## 2018-06-28 DIAGNOSIS — Z79899 Other long term (current) drug therapy: Secondary | ICD-10-CM | POA: Insufficient documentation

## 2018-06-28 LAB — BASIC METABOLIC PANEL
ANION GAP: 5 (ref 5–15)
BUN: 15 mg/dL (ref 8–23)
CALCIUM: 9.5 mg/dL (ref 8.9–10.3)
CO2: 29 mmol/L (ref 22–32)
Chloride: 105 mmol/L (ref 98–111)
Creatinine, Ser: 1.57 mg/dL — ABNORMAL HIGH (ref 0.61–1.24)
GFR, EST AFRICAN AMERICAN: 52 mL/min — AB (ref >60)
GFR, EST NON AFRICAN AMERICAN: 45 mL/min — AB (ref >60)
GLUCOSE: 88 mg/dL (ref 70–99)
POTASSIUM: 4.1 mmol/L (ref 3.5–5.1)
SODIUM: 139 mmol/L (ref 135–145)

## 2018-06-28 LAB — CBC
HEMATOCRIT: 34.3 % — AB (ref 40.0–52.0)
HEMOGLOBIN: 12.1 g/dL — AB (ref 13.0–18.0)
MCH: 30.4 pg (ref 26.0–34.0)
MCHC: 35.4 g/dL (ref 32.0–36.0)
MCV: 86 fL (ref 80.0–100.0)
Platelets: 170 10*3/uL (ref 150–440)
RBC: 3.98 MIL/uL — AB (ref 4.40–5.90)
RDW: 14.9 % — ABNORMAL HIGH (ref 11.5–14.5)
WBC: 6.7 10*3/uL (ref 3.8–10.6)

## 2018-06-28 LAB — TROPONIN I: Troponin I: <0.03 ng/mL (ref <0.03)

## 2018-06-28 MED ORDER — MECLIZINE HCL 12.5 MG PO TABS: 12.5000 mg | ORAL_TABLET | Freq: Two times a day (BID) | ORAL | 0 refills | Status: DC | PRN

## 2018-06-28 MED ORDER — SODIUM CHLORIDE 0.9 % IV BOLUS
1000.0000 mL | Freq: Once | INTRAVENOUS | Status: AC
  Administered 2018-06-28: 1000 mL via INTRAVENOUS

## 2018-06-28 MED ORDER — MECLIZINE HCL 25 MG PO TABS
12.5000 mg | ORAL_TABLET | Freq: Once | ORAL | Status: AC
  Administered 2018-06-28: 12.5 mg via ORAL
  Filled 2018-06-28: qty 1

## 2018-06-28 NOTE — Discharge Instructions (Addendum)
Be sure to drink plenty of fluids.  Return for any worsening symptoms or concerns.

## 2018-06-28 NOTE — ED Notes (Signed)
Patient transported to CT 

## 2018-06-28 NOTE — ED Provider Notes (Signed)
[facility] Emergency Department Provider Note    First MD Initiated Contact with Patient 06/28/18 1818     (approximate)  I have reviewed the triage vital signs and the nursing notes.   HISTORY  Chief Complaint Chest Pain    HPI Alexander Avery is a 62 y.o. male presents the ER with lightheadedness particularly when standing that developed yesterday.  States anytime he moves his head too quickly he starts feeling like the room is spinning.  Is never had symptoms like this before.  Denies any chest pain or shortness of breath.  Did feel weak earlier because he noticed that his blood pressure was low.  Did not have lateralizing weakness but felt more fatigued.  No recent medication changes.  States he feels like he is not been drinking enough water.    Past Medical History:  Diagnosis Date  . Arthritis   . Cancer ([place])   . Diabetes mellitus without complication ([place])   . GERD (gastroesophageal reflux disease)   . Hypertension    No family history on file. Past Surgical History:  Procedure Laterality Date  . CATARACT EXTRACTION W/PHACO Right 03/30/2018   Procedure: CATARACT EXTRACTION PHACO AND INTRAOCULAR LENS PLACEMENT (IOC);  Surgeon: Eulogio Bear, MD;  Location: [facility] ORS;  Service: Ophthalmology;  Laterality: Right;  [place] 01:09.5 AP% 10.4 CDE 7.98 Fluid pack lot # [id number]  . EYE SURGERY    . HERNIA REPAIR    . PROSTATE SURGERY     There are no active problems to display for this patient.     Prior to Admission medications   Medication Sig Start Date End Date Taking? Authorizing Provider  acetaminophen (TYLENOL) 325 MG tablet Take 650 mg by mouth every 6 (six) hours as needed for moderate pain or headache.    [provider]  atorvastatin (LIPITOR) 40 MG tablet Take 40 mg by mouth at bedtime. 05/22/18   [provider]  canagliflozin (INVOKANA) 300 MG TABS tablet Take 300 mg by mouth daily before breakfast.    [provider]  Difluprednate (DUREZOL) 0.05 % EMUL Place 1 drop into the right eye 2 (two) times daily.     [provider]  losartan-hydrochlorothiazide (HYZAAR) 100-25 MG tablet Take 1 tablet by mouth daily.    [provider]  meclizine (ANTIVERT) 12.5 MG tablet Take 1 tablet (12.5 mg total) by mouth 2 (two) times daily as needed for dizziness. 06/28/18   Merlyn Lot, MD  traMADol (ULTRAM) 50 MG tablet Take 1 tablet (50 mg total) by mouth every 6 (six) hours as needed. Patient not taking: Reported on 03/28/2018 09/12/17 09/12/18  Gregor Hams, MD    Allergies Penicillins    Social History Social History   Tobacco Use  . Smoking status: Current Some Day Smoker  . Smokeless tobacco: Never Used  Substance Use Topics  . Alcohol use: Yes  . Drug use: No    Review of Systems Patient denies headaches, rhinorrhea, blurry vision, numbness, shortness of breath, chest pain, edema, cough, abdominal pain, nausea, vomiting, diarrhea, dysuria, fevers, rashes or hallucinations unless otherwise stated above in HPI. ____________________________________________   PHYSICAL EXAM:  VITAL SIGNS: Vitals:   06/28/18 1956 06/28/18 2147  BP: 125/68 138/88  Pulse: (!) 56 (!) 58  Resp: 18 19  Temp:    SpO2: 100% 100%    Constitutional: Alert and oriented.  Eyes: Conjunctivae are normal.  Head: Atraumatic. Nose: No congestion/rhinnorhea. Mouth/Throat: Mucous membranes are  moist.   Neck: No stridor. Painless ROM.  Cardiovascular: Normal rate, regular rhythm. Grossly normal heart sounds.  Good peripheral circulation. Respiratory: Normal respiratory effort.  No retractions. Lungs CTAB. Gastrointestinal: Soft and nontender. No distention. No abdominal bruits. No CVA tenderness. Genitourinary: deferred Musculoskeletal: No lower extremity tenderness nor edema.  No joint effusions. Neurologic:  CN- intact.  No facial droop, Normal FNF.  Normal heel to shin.  Sensation  intact bilaterally. Normal speech and language. No gross focal neurologic deficits are appreciated. No gait instability.  HINTS exam negative Skin:  Skin is warm, dry and intact. No rash noted. Psychiatric: Mood and affect are normal. Speech and behavior are normal.  ____________________________________________   LABS (all labs ordered are listed, but only abnormal results are displayed)  Results for orders placed or performed during the hospital encounter of 06/28/18 (from the past 24 hour(s))  Basic metabolic panel     Status: Abnormal   Collection Time: 06/28/18  5:46 PM  Result Value Ref Range   Sodium 139 135 - 145 mmol/L   Potassium 4.1 3.5 - 5.1 mmol/L   Chloride 105 98 - 111 mmol/L   CO2 29 22 - 32 mmol/L   Glucose, Bld 88 70 - 99 mg/dL   BUN 15 8 - 23 mg/dL   Creatinine, Ser 1.57 (H) 0.61 - 1.24 mg/dL   Calcium 9.5 8.9 - 10.3 mg/dL   GFR calc non Af Amer 45 (L) >60 mL/min   GFR calc Af Amer 52 (L) >60 mL/min   Anion gap 5 5 - 15  CBC     Status: Abnormal   Collection Time: 06/28/18  5:46 PM  Result Value Ref Range   WBC 6.7 3.8 - 10.6 K/uL   RBC 3.98 (L) 4.40 - 5.90 MIL/uL   Hemoglobin 12.1 (L) 13.0 - 18.0 g/dL   HCT 34.3 (L) 40.0 - 52.0 %   MCV 86.0 80.0 - 100.0 fL   MCH 30.4 26.0 - 34.0 pg   MCHC 35.4 32.0 - 36.0 g/dL   RDW 14.9 (H) 11.5 - 14.5 %   Platelets 170 150 - 440 K/uL  Troponin I     Status: None   Collection Time: 06/28/18  5:46 PM  Result Value Ref Range   Troponin I <0.03 <0.03 ng/mL  Troponin I     Status: None   Collection Time: 06/28/18  8:45 PM  Result Value Ref Range   Troponin I <0.03 <0.03 ng/mL   ____________________________________________  EKG My review and personal interpretation at Time: 17:34   Indication: dizziness  Rate: 60  Rhythm: sinus Axis: normal Other: normal intervals, concave upwards st deviation in lateral leads, no reciprocal change, LVH criteria ____________________________________________  RADIOLOGY  I personally  reviewed all radiographic images ordered to evaluate for the above acute complaints and reviewed radiology reports and findings.  These findings were personally discussed with the patient.  Please see medical record for radiology report.  ____________________________________________   PROCEDURES  Procedure(s) performed:  Procedures    Critical Care performed: no ____________________________________________   INITIAL IMPRESSION / ASSESSMENT AND PLAN / ED COURSE  Pertinent labs & imaging results that were available during my care of the patient were reviewed by me and considered in my medical decision making (see chart for details).   DDX: Vertigo, dehydration, electrolye abnormality, unlikely CVA, mass, dysrhythmia, ACS  Alexander Avery is a 62 y.o. who presents to the ED with symptoms as described above.  Patient is AFVSS in  ED. Exam as above. Given current presentation have considered the above differential. The patient will be placed on continuous pulse oximetry and telemetry for monitoring.  Laboratory evaluation will be sent to evaluate for the above complaints.   Does appear clinically dehydrated and symptoms seem more consistent with dehydration possible vertigo.  Given his history and risk factors CT imaging ordered as well as stat chest x-ray ordered to evaluate for acute abnormality.  Give IV fluids as well as meclizine and reassess.  Clinical Course as of Jun 28 2258  Wed Jun 28, 2018  1945 Reassessed and feeling much improved.  Patient asking about when he can be discharged.  Encourage patient to complete IV fluids as well as recommended repeat troponin.  Patient agreeable at this time.   [PR]  2134 Repeat trip is negative.  I do believe patient stable and appropriate for outpatient follow-up.   [PR]    Clinical Course User Index [PR] Merlyn Lot, MD     As part of my medical decision making, I reviewed the following data within the [place] notes reviewed and incorporated, Labs reviewed, notes from prior ED visits and [place] Controlled Substance Database   ____________________________________________   FINAL CLINICAL IMPRESSION(S) / ED DIAGNOSES  Final diagnoses:  Dizziness      NEW MEDICATIONS STARTED DURING THIS VISIT:  Discharge Medication List as of 06/28/2018  9:34 PM    START taking these medications   Details  meclizine (ANTIVERT) 12.5 MG tablet Take 1 tablet (12.5 mg total) by mouth 2 (two) times daily as needed for dizziness., Starting Wed 06/28/2018, Print         Note:  This document was prepared using Dragon voice recognition software and may include unintentional dictation errors.    Merlyn Lot, MD 06/28/18 2259

## 2018-06-28 NOTE — ED Notes (Signed)
Patient transported to X-ray 

## 2018-06-28 NOTE — ED Triage Notes (Signed)
Pt presents via pov from home with cp since 1100 am and low bp (111/73). Pt has diabetes and htn. Pt alert & oriented with NAD noted.

## 2018-06-29 ENCOUNTER — Encounter
Admission: RE | Disposition: A | Discharge status: Home / Self Care | Payer: Self-pay | Source: Ambulatory Visit | Attending: Ophthalmology

## 2018-06-29 ENCOUNTER — Ambulatory Visit: Payer: Medicare Other | Admitting: Certified Registered"

## 2018-06-29 ENCOUNTER — Ambulatory Visit
Admission: RE | Admit: 2018-06-29 | Discharge: 2018-06-29 | Disposition: A | Discharge status: Home / Self Care | Payer: Medicare Other | Source: Ambulatory Visit | Attending: Ophthalmology | Admitting: Ophthalmology

## 2018-06-29 ENCOUNTER — Encounter: Payer: Self-pay | Admitting: *Deleted

## 2018-06-29 DIAGNOSIS — Z79899 Other long term (current) drug therapy: Secondary | ICD-10-CM | POA: Diagnosis not present

## 2018-06-29 DIAGNOSIS — E119 Type 2 diabetes mellitus without complications: Secondary | ICD-10-CM | POA: Insufficient documentation

## 2018-06-29 DIAGNOSIS — Z7984 Long term (current) use of oral hypoglycemic drugs: Secondary | ICD-10-CM | POA: Insufficient documentation

## 2018-06-29 DIAGNOSIS — F17200 Nicotine dependence, unspecified, uncomplicated: Secondary | ICD-10-CM | POA: Diagnosis not present

## 2018-06-29 DIAGNOSIS — I10 Essential (primary) hypertension: Secondary | ICD-10-CM | POA: Diagnosis not present

## 2018-06-29 DIAGNOSIS — Z8546 Personal history of malignant neoplasm of prostate: Secondary | ICD-10-CM | POA: Diagnosis not present

## 2018-06-29 DIAGNOSIS — H2512 Age-related nuclear cataract, left eye: Secondary | ICD-10-CM | POA: Diagnosis present

## 2018-06-29 HISTORY — PX: CATARACT EXTRACTION W/PHACO: SHX586

## 2018-06-29 LAB — GLUCOSE, CAPILLARY
Glucose-Capillary: 77 mg/dL (ref 70–99)
Glucose-Capillary: 91 mg/dL (ref 70–99)

## 2018-06-29 SURGERY — PHACOEMULSIFICATION, CATARACT, WITH IOL INSERTION
Anesthesia: General | Site: Eye | Laterality: Left | Wound class: Clean

## 2018-06-29 MED ORDER — TETRACAINE HCL 0.5 % OP SOLN
1.0000 [drp] | Freq: Once | OPHTHALMIC | Status: AC
  Administered 2018-06-29: 1 [drp] via OPHTHALMIC

## 2018-06-29 MED ORDER — PHENYLEPHRINE HCL 10 % OP SOLN
1.0000 [drp] | OPHTHALMIC | Status: AC
  Administered 2018-06-29 (×3): 1 [drp] via OPHTHALMIC

## 2018-06-29 MED ORDER — MOXIFLOXACIN HCL 0.5 % OP SOLN
OPHTHALMIC | Status: AC
  Filled 2018-06-29: qty 3

## 2018-06-29 MED ORDER — CYCLOPENTOLATE HCL 2 % OP SOLN
OPHTHALMIC | Status: AC
  Administered 2018-06-29: 08:00:00
  Filled 2018-06-29: qty 2

## 2018-06-29 MED ORDER — LIDOCAINE HCL (PF) 4 % IJ SOLN
INTRAMUSCULAR | Status: AC
  Filled 2018-06-29: qty 5

## 2018-06-29 MED ORDER — POVIDONE-IODINE 5 % OP SOLN
OPHTHALMIC | Status: AC
  Filled 2018-06-29: qty 30

## 2018-06-29 MED ORDER — PROPOFOL 10 MG/ML IV BOLUS
INTRAVENOUS | Status: AC
  Filled 2018-06-29: qty 20

## 2018-06-29 MED ORDER — MIDAZOLAM HCL 2 MG/2ML IJ SOLN
INTRAMUSCULAR | Status: AC
  Filled 2018-06-29: qty 2

## 2018-06-29 MED ORDER — LIDOCAINE HCL (PF) 2 % IJ SOLN
INTRAMUSCULAR | Status: AC
  Filled 2018-06-29: qty 10

## 2018-06-29 MED ORDER — TETRACAINE HCL 0.5 % OP SOLN
OPHTHALMIC | Status: AC
  Administered 2018-06-29: 09:00:00
  Filled 2018-06-29: qty 4

## 2018-06-29 MED ORDER — PROPOFOL 10 MG/ML IV BOLUS
INTRAVENOUS | Status: DC | PRN
  Administered 2018-06-29 (×2): 50 mg via INTRAVENOUS

## 2018-06-29 MED ORDER — GLYCOPYRROLATE 0.2 MG/ML IJ SOLN
INTRAMUSCULAR | Status: DC | PRN
  Administered 2018-06-29: 0.1 mg via INTRAVENOUS

## 2018-06-29 MED ORDER — MOXIFLOXACIN HCL 0.5 % OP SOLN: 1.0000 [drp] | OPHTHALMIC | Status: DC | PRN

## 2018-06-29 MED ORDER — SODIUM HYALURONATE 23 MG/ML IO SOLN
INTRAOCULAR | Status: DC | PRN
  Administered 2018-06-29: 0.6 mL via INTRAOCULAR

## 2018-06-29 MED ORDER — CYCLOPENTOLATE HCL 2 % OP SOLN
1.0000 [drp] | OPHTHALMIC | Status: AC
  Administered 2018-06-29 (×3): 1 [drp] via OPHTHALMIC

## 2018-06-29 MED ORDER — DEXMEDETOMIDINE HCL 200 MCG/2ML IV SOLN
INTRAVENOUS | Status: DC | PRN
  Administered 2018-06-29: 8 ug via INTRAVENOUS
  Administered 2018-06-29: 12 ug via INTRAVENOUS
  Administered 2018-06-29: 8 ug via INTRAVENOUS
  Administered 2018-06-29: 20 ug via INTRAVENOUS
  Administered 2018-06-29: 12 ug via INTRAVENOUS

## 2018-06-29 MED ORDER — MOXIFLOXACIN HCL 0.5 % OP SOLN
OPHTHALMIC | Status: DC | PRN
  Administered 2018-06-29: 0.2 mL via OPHTHALMIC

## 2018-06-29 MED ORDER — PROPOFOL 500 MG/50ML IV EMUL
INTRAVENOUS | Status: DC | PRN
  Administered 2018-06-29: 100 ug/kg/min via INTRAVENOUS

## 2018-06-29 MED ORDER — POVIDONE-IODINE 5 % OP SOLN
OPHTHALMIC | Status: DC | PRN
  Administered 2018-06-29: 1 via OPHTHALMIC

## 2018-06-29 MED ORDER — BSS IO SOLN
INTRAOCULAR | Status: DC | PRN
  Administered 2018-06-29: 15 mL via INTRAOCULAR

## 2018-06-29 MED ORDER — TETRACAINE HCL 0.5 % OP SOLN: 1.0000 [drp] | Freq: Once | OPHTHALMIC | Status: DC

## 2018-06-29 MED ORDER — TRIAMCINOLONE ACETONIDE 40 MG/ML IJ SUSP
INTRAMUSCULAR | Status: AC
  Filled 2018-06-29: qty 1

## 2018-06-29 MED ORDER — MIDAZOLAM HCL 2 MG/2ML IJ SOLN
INTRAMUSCULAR | Status: DC | PRN
  Administered 2018-06-29: 2 mg via INTRAVENOUS
  Administered 2018-06-29 (×2): 1 mg via INTRAVENOUS

## 2018-06-29 MED ORDER — PHENYLEPHRINE HCL 10 % OP SOLN
OPHTHALMIC | Status: AC
Start: 2018-06-29 — End: 2018-06-29
  Administered 2018-06-29: 09:00:00
  Filled 2018-06-29: qty 5

## 2018-06-29 MED ORDER — SODIUM CHLORIDE 0.9 % IV SOLN
INTRAVENOUS | Status: DC
  Administered 2018-06-29: 08:00:00 via INTRAVENOUS

## 2018-06-29 MED ORDER — EPINEPHRINE PF 1 MG/ML IJ SOLN
INTRAMUSCULAR | Status: AC
  Filled 2018-06-29: qty 2

## 2018-06-29 MED ORDER — SODIUM HYALURONATE 23 MG/ML IO SOLN
INTRAOCULAR | Status: AC
  Filled 2018-06-29: qty 0.6

## 2018-06-29 MED ORDER — TRYPAN BLUE 0.06 % OP SOLN
OPHTHALMIC | Status: DC | PRN
  Administered 2018-06-29: 0.5 mL via INTRAOCULAR

## 2018-06-29 MED ORDER — EPINEPHRINE PF 1 MG/ML IJ SOLN
INTRAOCULAR | Status: DC | PRN
  Administered 2018-06-29: 09:00:00 via OPHTHALMIC

## 2018-06-29 MED ORDER — SODIUM HYALURONATE 10 MG/ML IO SOLN
INTRAOCULAR | Status: DC | PRN
  Administered 2018-06-29: 0.55 mL via INTRAOCULAR

## 2018-06-29 MED ORDER — GLYCOPYRROLATE 0.2 MG/ML IJ SOLN
INTRAMUSCULAR | Status: AC
  Filled 2018-06-29: qty 1

## 2018-06-29 MED ORDER — TRYPAN BLUE 0.06 % OP SOLN
OPHTHALMIC | Status: AC
  Filled 2018-06-29: qty 0.5

## 2018-06-29 MED ORDER — TRIAMCINOLONE ACETONIDE 40 MG/ML IJ SUSP
INTRAMUSCULAR | Status: DC | PRN
  Administered 2018-06-29: 40 mg

## 2018-06-29 MED ORDER — LIDOCAINE HCL (PF) 4 % IJ SOLN
INTRAMUSCULAR | Status: DC | PRN
  Administered 2018-06-29: 4 mL via OPHTHALMIC

## 2018-06-29 MED ORDER — LIDOCAINE HCL (CARDIAC) PF 100 MG/5ML IV SOSY
PREFILLED_SYRINGE | INTRAVENOUS | Status: DC | PRN
  Administered 2018-06-29: 100 mg via INTRATRACHEAL

## 2018-06-29 MED ORDER — NA CHONDROIT SULF-NA HYALURON 40-30 MG/ML IO SOLN
INTRAOCULAR | Status: DC | PRN
  Administered 2018-06-29 (×2): 0.5 mL via INTRAOCULAR

## 2018-06-29 SURGICAL SUPPLY — 21 items
CANNULA ANT/CHMB 27G (MISCELLANEOUS) IMPLANT
CANNULA ANT/CHMB 27GA (MISCELLANEOUS) ×3 IMPLANT
DISSECTOR HYDRO NUCLEUS 50X22 (MISCELLANEOUS) ×3 IMPLANT
GLOVE BIO SURGEON STRL SZ8 (GLOVE) ×3 IMPLANT
GLOVE BIOGEL M 6.5 STRL (GLOVE) ×3 IMPLANT
GLOVE SURG LX 7.5 STRW (GLOVE) ×2
GLOVE SURG LX STRL 7.5 STRW (GLOVE) ×1 IMPLANT
GOWN STRL REUS W/ TWL LRG LVL3 (GOWN DISPOSABLE) ×2 IMPLANT
GOWN STRL REUS W/TWL LRG LVL3 (GOWN DISPOSABLE) ×6
LABEL CATARACT MEDS ST (LABEL) ×3 IMPLANT
LENS IOL TECNIS ITEC 21.0 (Intraocular Lens) ×2 IMPLANT
NDL SAFETY ECLIPSE 18X1.5 (NEEDLE) IMPLANT
NEEDLE HYPO 18GX1.5 SHARP (NEEDLE) ×3
PACK CATARACT (MISCELLANEOUS) ×3 IMPLANT
PACK CATARACT KING (MISCELLANEOUS) ×3 IMPLANT
PACK EYE AFTER SURG (MISCELLANEOUS) ×3 IMPLANT
SOL BSS BAG (MISCELLANEOUS) ×3
SOLUTION BSS BAG (MISCELLANEOUS) ×1 IMPLANT
SYR 3ML LL SCALE MARK (SYRINGE) ×2 IMPLANT
WATER STERILE IRR 250ML POUR (IV SOLUTION) ×3 IMPLANT
WIPE NON LINTING 3.25X3.25 (MISCELLANEOUS) ×3 IMPLANT

## 2018-06-29 NOTE — Op Note (Signed)
OPERATIVE NOTE  Alexander Avery [id number] 06/29/2018   PREOPERATIVE DIAGNOSIS:  Nuclear sclerotic cataract left eye.  H25.12   POSTOPERATIVE DIAGNOSIS:    Nuclear sclerotic cataract left eye.     PROCEDURE:  Phacoemusification with posterior chamber intraocular lens placement of the left eye   LENS:   Implant Name Type Inv. Item Serial No. Manufacturer Lot No. LRB No. Used  LENS IOL DIOP 21.0 - [id number] Intraocular Lens LENS IOL DIOP 21.0 [id number] AMO  Left 1       PCB00 +21.0   ULTRASOUND TIME: 1 minutes 27 seconds.  CDE 7.27   SURGEON:  Benay Pillow, MD, MPH   ANESTHESIA:  Topical with tetracaine drops augmented with 1% preservative-free intracameral lidocaine.  ESTIMATED BLOOD LOSS: <1 mL   COMPLICATIONS:  None.   DESCRIPTION OF PROCEDURE:  The patient was identified in the holding room and transported to the operating room and placed in the supine position under the operating microscope.  The left eye was identified as the operative eye and it was prepped and draped in the usual sterile ophthalmic fashion.   A 1.0 millimeter clear-corneal paracentesis was made at the 5:00 position. 0.5 ml of preservative-free 1% lidocaine with epinephrine was injected into the anterior chamber.  There was a poor red reflex, so Trypan blue was instilled under an air bubble and rinsed out with BSS to stain the capsule for improved visualization.   The anterior chamber was filled with Healon 5 viscoelastic.  A 2.4 millimeter keratome was used to make a near-clear corneal incision at the 2:00 position.  A casulotomy was initiated and the phaco was used to decompress some of the milky cortex.  A curvilinear capsulorrhexis was made with a cystotome and capsulorrhexis forceps.   There was some patient movement during the rhexis and the anterior rhexis went peripheral at 8:00.  Additional anesthesia was administered and the rhexis was continued from the other direction at its origin in a  clockwise fashion with good completion.    Phacoemulsification was then used in stop and chop fashion to remove the lens nucleus and epinucleus.  At one point the patient awoke and violently moved and squeezed out the lid speculum.    Viscoat was injected into the eye.  There was no apparent posterior capsular rupture.  Kenalog was injected into the anterior chamber and no vitreous was identified.  The remaining cortex was then removed using the irrigation and aspiration handpiece. Healon was then placed into the capsular bag to distend it for lens placement.  A lens was then injected into the capsular bag.  The remaining viscoelastic was aspirated.   Wounds were hydrated with balanced salt solution.  The anterior chamber was inflated to a physiologic pressure with balanced salt solution.  Intracameral vigamox 0.1 mL undiltued was injected into the eye and a drop placed onto the ocular surface.  0.05 mL of Kenalog was injected into the anterior chamber.   No wound leaks were noted.  The patient was taken to the recovery room in stable condition without complications of anesthesia or surgery  Benay Pillow 06/29/2018, 10:13 AM

## 2018-06-29 NOTE — Anesthesia Preprocedure Evaluation (Signed)
Anesthesia Evaluation  Patient identified by MRN, date of birth, ID band Patient awake    Reviewed: Allergy & Precautions, NPO status , Patient's Chart, lab work & pertinent test results  History of Anesthesia Complications Negative for: history of anesthetic complications  Airway Mallampati: II  TM Distance: >3 FB Neck ROM: Full    Dental no notable dental hx.    Pulmonary neg sleep apnea, neg COPD, Current Smoker,    breath sounds clear to auscultation- rhonchi (-) wheezing      Cardiovascular Exercise Tolerance: Good hypertension, Pt. on medications (-) CAD, (-) Past MI, (-) Cardiac Stents and (-) CABG  Rhythm:Regular Rate:Normal - Systolic murmurs and - Diastolic murmurs    Neuro/Psych negative neurological ROS  negative psych ROS   GI/Hepatic Neg liver ROS, GERD  ,  Endo/Other  diabetes, Oral Hypoglycemic Agents  Renal/GU negative Renal ROS     Musculoskeletal  (+) Arthritis ,   Abdominal (+) - obese,   Peds  Hematology negative hematology ROS (+)   Anesthesia Other Findings Past Medical History: No date: Arthritis No date: Cancer (HCC)     Comment:  prostate No date: Diabetes mellitus without complication (HCC) No date: GERD (gastroesophageal reflux disease) No date: Hypertension   Reproductive/Obstetrics                             Anesthesia Physical Anesthesia Plan  ASA: II  Anesthesia Plan: MAC   Post-op Pain Management:    Induction: Intravenous  PONV Risk Score and Plan: 0 and Midazolam  Airway Management Planned: Natural Airway  Additional Equipment:   Intra-op Plan:   Post-operative Plan:   Informed Consent: I have reviewed the patients History and Physical, chart, labs and discussed the procedure including the risks, benefits and alternatives for the proposed anesthesia with the patient or authorized representative who has indicated his/her understanding  and acceptance.     Plan Discussed with: CRNA and Anesthesiologist  Anesthesia Plan Comments:         Anesthesia Quick Evaluation

## 2018-06-29 NOTE — Transfer of Care (Signed)
Immediate Anesthesia Transfer of Care Note  Patient: Alexander Avery  Procedure(s) Performed: CATARACT EXTRACTION PHACO AND INTRAOCULAR LENS PLACEMENT (IOC) (Left Eye)  Patient Location: PACU  Anesthesia Type:General  Level of Consciousness: drowsy and patient cooperative  Airway & Oxygen Therapy: Patient Spontanous Breathing  Post-op Assessment: Report given to RN, Post -op Vital signs reviewed and stable and Patient moving all extremities  Post vital signs: Reviewed and stable  Last Vitals:  Vitals Value Taken Time  BP 83/58 06/29/2018 10:18 AM  Temp 36.4 C 06/29/2018 10:16 AM  Pulse 70 06/29/2018 10:19 AM  Resp 17 06/29/2018 10:19 AM  SpO2 99 % 06/29/2018 10:19 AM  Vitals shown include unvalidated device data.  Last Pain:  Vitals:   06/29/18 1016  TempSrc: Temporal  PainSc: Asleep         Complications: No apparent anesthesia complications

## 2018-06-29 NOTE — Discharge Instructions (Signed)
Eye Surgery Discharge Instructions    Expect mild scratchy sensation or mild soreness. DO NOT RUB YOUR EYE!  The day of surgery:  Minimal physical activity, but bed rest is not required  No reading, computer work, or close hand work  No bending, lifting, or straining.  May watch TV  For 24 hours:  No driving, legal decisions, or alcoholic beverages  Safety precautions  Eat anything you prefer: It is better to start with liquids, then soup then solid foods.  _____ Eye patch should be worn until postoperative exam tomorrow.  ____ Solar shield eyeglasses should be worn for comfort in the sunlight/patch while sleeping  Resume all regular medications including aspirin or Coumadin if these were discontinued prior to surgery. You may shower, bathe, shave, or wash your hair. Tylenol may be taken for mild discomfort.  Call your doctor if you experience significant pain, nausea, or vomiting, fever > 101 or other signs of infection. [phone] or [phone] Specific instructions:  Follow-up Information    Eulogio Bear, MD Follow up.   Specialty:  Ophthalmology Why:  06/30/18 at [facility] information: [place] [address] [phone]          Eye Surgery Discharge Instructions    Expect mild scratchy sensation or mild soreness. DO NOT RUB YOUR EYE!  The day of surgery:  Minimal physical activity, but bed rest is not required  No reading, computer work, or close hand work  No bending, lifting, or straining.  May watch TV  For 24 hours:  No driving, legal decisions, or alcoholic beverages  Safety precautions  Eat anything you prefer: It is better to start with liquids, then soup then solid foods.  _____ Eye patch should be worn until postoperative exam tomorrow.  ____ Solar shield eyeglasses should be worn for comfort in the sunlight/patch while sleeping  Resume all regular medications including aspirin or  Coumadin if these were discontinued prior to surgery. You may shower, bathe, shave, or wash your hair. Tylenol may be taken for mild discomfort.  Call your doctor if you experience significant pain, nausea, or vomiting, fever > 101 or other signs of infection. [phone] or [phone] Specific instructions:  Follow-up Information    Eulogio Bear, MD Follow up.   Specialty:  Ophthalmology Why:  06/30/18 at [facility] information: [place] [address] [phone]

## 2018-06-29 NOTE — Progress Notes (Signed)
Patient's BP 84/59 upon arrival to PACU from cataract surgery. Retake 83/53. Map 68. Denice Paradise, CRNA at bedside and aware. Per Denice Paradise, no intervention needed.

## 2018-06-29 NOTE — Anesthesia Procedure Notes (Signed)
Procedure Name: MAC Date/Time: 06/29/2018 9:26 AM Performed by: Johnna Acosta, CRNA Pre-anesthesia Checklist: Patient identified, Emergency Drugs available, Suction available, Patient being monitored and Timeout performed Patient Re-evaluated:Patient Re-evaluated prior to induction Oxygen Delivery Method: Nasal cannula Preoxygenation: Pre-oxygenation with 100% oxygen

## 2018-06-29 NOTE — Anesthesia Post-op Follow-up Note (Signed)
Anesthesia QCDR form completed.        

## 2018-06-29 NOTE — H&P (Signed)
The History and Physical notes are on paper, have been signed, and are to be scanned.   I have examined the patient and there are no changes to the H&P.   Benay Pillow 06/29/2018 8:41 AM

## 2018-06-29 NOTE — Anesthesia Postprocedure Evaluation (Signed)
Anesthesia Post Note  Patient: Armari D Zayed  Procedure(s) Performed: CATARACT EXTRACTION PHACO AND INTRAOCULAR LENS PLACEMENT (IOC) (Left Eye)  Patient location during evaluation: PACU Anesthesia Type: General Level of consciousness: awake and alert and oriented Pain management: pain level controlled Vital Signs Assessment: post-procedure vital signs reviewed and stable Respiratory status: spontaneous breathing, nonlabored ventilation and respiratory function stable Cardiovascular status: blood pressure returned to baseline and stable Postop Assessment: no signs of nausea or vomiting Anesthetic complications: no     Last Vitals:  Vitals:   06/29/18 1102 06/29/18 1106  BP:  104/70  Pulse: 65 61  Resp: 17 17  Temp:    SpO2: 99% 97%    Last Pain:  Vitals:   06/29/18 1102  TempSrc:   PainSc: 0-No pain                 Lyndsy Gilberto

## 2018-09-04 ENCOUNTER — Encounter: Payer: Self-pay | Admitting: Urology

## 2018-09-04 ENCOUNTER — Ambulatory Visit (INDEPENDENT_AMBULATORY_CARE_PROVIDER_SITE_OTHER): Payer: Medicare Other | Admitting: Urology

## 2018-09-04 VITALS — BP 188/92 | HR 60 | Ht 66.0 in | Wt 133.8 lb

## 2018-09-04 DIAGNOSIS — Z8546 Personal history of malignant neoplasm of prostate: Secondary | ICD-10-CM | POA: Diagnosis not present

## 2018-09-04 LAB — BLADDER SCAN AMB NON-IMAGING: SCAN RESULT: 17

## 2018-09-04 NOTE — Progress Notes (Signed)
09/04/2018 3:25 PM   Alexander Avery 1956-07-17 [id number]  Referring provider: No referring provider defined for this encounter.  Chief Complaint  Patient presents with  . Prostate Cancer    HPI: Patient is a 62 year old African-American male with a history of prostate cancer who presents today for difficulty with urination.  He has been having frequency x "no telling", strong urgency, dysuria, nocturia x 3-4, urge incontinece, intermittency, straining to urinate and a weak urinary stream.  Patient denies any gross hematuria or suprapubic/flank pain.  Patient denies any fevers, chills, nausea or vomiting.   He had underwent a prostatectomy in 2016 in [place], [place] for prostate cancer.  He states he was told his cancer was in the early stage.  He states that that he has had three "scrapings" through his penis in the urology office the first year after his prostate surgery.    He also complains of erectile dysfunction, since his surgery.  He has been having a rash on his penis that occurs every three to four mouths.    He is also having issues with his BM's alternating from diarrhea and constipation.  He states that has been since surgery.    His UA is negative.   His PVR is 17 mL.    PMH: Past Medical History:  Diagnosis Date  . Anxiety   . Arthritis   . Cancer [facility])    prostate  . Cataract 2019  . Diabetes mellitus without complication ([place])   . GERD (gastroesophageal reflux disease)   . Hernia of abdominal wall 2015  . History of stomach ulcers   . Hypertension   . Prostate cancer ([place])   . Seizures ([place])   . Sleep apnea     Surgical History: Past Surgical History:  Procedure Laterality Date  . CATARACT EXTRACTION W/PHACO Right 03/30/2018   Procedure: CATARACT EXTRACTION PHACO AND INTRAOCULAR LENS PLACEMENT (IOC);  Surgeon: Eulogio Bear, MD;  Location: [facility] ORS;  Service: Ophthalmology;  Laterality: Right;  [place] 01:09.5 AP% 10.4 CDE 7.98 Fluid pack lot #  [id number]  . CATARACT EXTRACTION W/PHACO Left 06/29/2018   Procedure: CATARACT EXTRACTION PHACO AND INTRAOCULAR LENS PLACEMENT (IOC);  Surgeon: Eulogio Bear, MD;  Location: [facility] ORS;  Service: Ophthalmology;  Laterality: Left;  [place] 01:27.7 AP% 8.2 CDE 7.27 Fluid pack lot # [id number]  . EYE SURGERY    . HERNIA REPAIR    . PROSTATE SURGERY    . PROSTATE SURGERY  2016    Home Medications:  Allergies as of 09/04/2018      Reactions   Penicillins Hives, Other (See Comments)   Urinate on self, pass out Has patient had a PCN reaction causing immediate rash, facial/tongue/throat swelling, SOB or lightheadedness with hypotension: Yes Has patient had a PCN reaction causing severe rash involving mucus membranes or skin necrosis: No Has patient had a PCN reaction that required hospitalization: Yes- In hospital Has patient had a PCN reaction occurring within the last 10 years: Yes If all of the above answers are "NO", then may proceed with Cephalosporin use.      Medication List        Accurate as of 09/04/18  3:25 PM. Always use your most recent med list.          acetaminophen 325 MG tablet Commonly known as:  TYLENOL Take 650 mg by mouth every 6 (six) hours as needed for moderate pain or headache.   atorvastatin 40 MG tablet Commonly known as:  LIPITOR Take 40 mg by mouth at bedtime.   DUREZOL 0.05 % Emul Generic drug:  Difluprednate Place 1 drop into the right eye 2 (two) times daily.   INVOKANA 300 MG Tabs tablet Generic drug:  canagliflozin Take 300 mg by mouth daily before breakfast.   losartan-hydrochlorothiazide 100-25 MG tablet Commonly known as:  HYZAAR Take 1 tablet by mouth daily.   meclizine 12.5 MG tablet Commonly known as:  ANTIVERT Take 1 tablet (12.5 mg total) by mouth 2 (two) times daily as needed for dizziness.   metFORMIN 500 MG tablet Commonly known as:  GLUCOPHAGE Take 500 mg by mouth 2 (two) times daily with a meal.   traMADol 50 MG  tablet Commonly known as:  ULTRAM Take 1 tablet (50 mg total) by mouth every 6 (six) hours as needed.       Allergies:  Allergies  Allergen Reactions  . Penicillins Hives and Other (See Comments)    Urinate on self, pass out Has patient had a PCN reaction causing immediate rash, facial/tongue/throat swelling, SOB or lightheadedness with hypotension: Yes Has patient had a PCN reaction causing severe rash involving mucus membranes or skin necrosis: No Has patient had a PCN reaction that required hospitalization: Yes- In hospital Has patient had a PCN reaction occurring within the last 10 years: Yes If all of the above answers are "NO", then may proceed with Cephalosporin use.     Family History: Family History  Problem Relation Age of Onset  . Cancer Mother     Social History:  reports that he has been smoking. He has been smoking about 2.00 packs per day. He has never used smokeless tobacco. He reports that he drinks about 2.0 standard drinks of alcohol per week. He reports that he does not use drugs.  ROS: UROLOGY Frequent Urination?: Yes Hard to postpone urination?: Yes Burning/pain with urination?: Yes Get up at night to urinate?: Yes Leakage of urine?: Yes Urine stream starts and stops?: Yes Trouble starting stream?: No Do you have to strain to urinate?: Yes Blood in urine?: No Urinary tract infection?: No Sexually transmitted disease?: No Injury to kidneys or bladder?: No Painful intercourse?: No Weak stream?: Yes Erection problems?: Yes Penile pain?: Yes  Gastrointestinal Nausea?: Yes Vomiting?: No Indigestion/heartburn?: No Diarrhea?: Yes Constipation?: Yes  Constitutional Fever: No Night sweats?: Yes Weight loss?: No Fatigue?: No  Skin Skin rash/lesions?: No Itching?: No  Eyes Blurred vision?: Yes Double vision?: Yes  Ears/Nose/Throat Sore throat?: No Sinus problems?: No  Hematologic/Lymphatic Swollen glands?: No Easy bruising?:  No  Cardiovascular Leg swelling?: No Chest pain?: No  Respiratory Cough?: Yes Shortness of breath?: Yes  Endocrine Excessive thirst?: Yes  Musculoskeletal Back pain?: No Joint pain?: No  Neurological Headaches?: Yes Dizziness?: Yes  Psychologic Depression?: No Anxiety?: Yes  Physical Exam: BP (!) 188/92 (BP Location: Left Arm, Patient Position: Sitting, Cuff Size: Normal)   Pulse 60   Ht 5\' 6"  (1.676 m)   Wt 133 lb 12.8 oz (60.7 kg)   BMI 21.60 kg/m   Constitutional:  Well nourished. Alert and oriented, No acute distress. HEENT: [place] AT, moist mucus membranes.  Trachea midline, no masses. Cardiovascular: No clubbing, cyanosis, or edema. Respiratory: Normal respiratory effort, no increased work of breathing. GI: Abdomen is soft, non tender, non distended, no abdominal masses. Liver and spleen not palpable.  No hernias appreciated.  Stool sample for occult testing is not indicated.   GU: No CVA tenderness.  No bladder fullness or masses.  Patient with uncircumcised phallus.  Urethral meatus is patent.  No penile discharge.   Small ulcers on the ventral side of his penis just behind the coronal ridge.   Scrotum without lesions, cysts, rashes and/or edema.  Testicles are located scrotally bilaterally. No masses are appreciated in the testicles. Left and right epididymis are normal. Rectal: Prostate is surgically absent. Skin: No rashes, bruises or suspicious lesions. Lymph: No cervical or inguinal adenopathy. Neurologic: Grossly intact, no focal deficits, moving all 4 extremities. Psychiatric: Normal mood and affect.  Laboratory Data: Lab Results  Component Value Date   WBC 6.7 06/28/2018   HGB 12.1 (L) 06/28/2018   HCT 34.3 (L) 06/28/2018   MCV 86.0 06/28/2018   PLT 170 06/28/2018    Lab Results  Component Value Date   CREATININE 1.57 (H) 06/28/2018    No results found for: PSA  No results found for: TESTOSTERONE  No results found for: HGBA1C  No results  found for: TSH  No results found for: CHOL, HDL, CHOLHDL, VLDL, LDLCALC  Lab Results  Component Value Date   AST 113 (H) 05/08/2007   Lab Results  Component Value Date   ALT 182 (H) 05/08/2007   No components found for: ALKALINEPHOPHATASE No components found for: BILIRUBINTOTAL  No results found for: ESTRADIOL  Urinalysis    Component Value Date/Time   COLORURINE AMBER BIOCHEMICALS MAY BE AFFECTED BY COLOR (A) 08/24/2008 1433   APPEARANCEUR CLEAR 08/24/2008 1433   LABSPEC 1.028 08/24/2008 1433   PHURINE 5.5 08/24/2008 1433   GLUCOSEU NEGATIVE 08/24/2008 1433   HGBUR NEGATIVE 08/24/2008 1433   BILIRUBINUR SMALL (A) 08/24/2008 1433   KETONESUR 15 (A) 08/24/2008 1433   PROTEINUR NEGATIVE 08/24/2008 1433   UROBILINOGEN 1.0 08/24/2008 1433   NITRITE NEGATIVE 08/24/2008 1433   LEUKOCYTESUR SMALL (A) 08/24/2008 1433    I have reviewed the labs.   Pertinent Imaging: Results for KONNER, SAIZ (MRN [id number]) as of 09/04/2018 15:12  Ref. Range 09/04/2018 14:49  Scan Result Unknown 17     Assessment & Plan:    1. Prostate cancer Requesting medical records -pathology and PSA are unknown at this time PSA drawn today  2. ? Bladder neck contracture Schedule cystoscopy for further evaluation I have explained to the patient that they will  be scheduled for a cystoscopy in our office to evaluate their bladder.  The cystoscopy consists of passing a tube with a lens up through their urethra and into their urinary bladder.   We will inject the urethra with a lidocaine gel prior to introducing the cystoscope to help with any discomfort during the procedure.   After the procedure, they might experience blood in the urine and discomfort with urination.  This will abate after the first few voids.  I have  encouraged the patient to increase water intake  during this time.  Patient denies any allergies to lidocaine.    Return for cystoscopy .  These notes generated with voice  recognition software. I apologize for typographical errors.  [name], PA-C  [facility] [address]  [place], [place] [address] [phone]  Addendum:  Records received from [facility]            iPSA 15.0 - DRE nodularity and induration in the left lobe            Biopsy on 02/05/2015    Prostate 20.5 grams PSA density of 0.73  Gleason score 6 and 7 (3+4) involving all 6 biopsies zones   RRP on 04/21/2015 -pathology showed a Gleason score 7 involving 25% of the gland with negative surgical margins  3 weeks after prostatectomy patient went into urinary retention.  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

## 2018-09-05 LAB — URINALYSIS, COMPLETE
Bilirubin, UA: NEGATIVE
GLUCOSE, UA: NEGATIVE
Ketones, UA: NEGATIVE
LEUKOCYTES UA: NEGATIVE
Nitrite, UA: NEGATIVE
PH UA: 5.5 (ref 5.0–7.5)
PROTEIN UA: NEGATIVE
RBC UA: NEGATIVE
SPEC GRAV UA: 1.01 (ref 1.005–1.030)
Urobilinogen, Ur: 0.2 mg/dL (ref 0.2–1.0)

## 2018-09-05 LAB — PSA: PROSTATE SPECIFIC AG, SERUM: 0.4 ng/mL (ref 0.0–4.0)

## 2018-09-13 ENCOUNTER — Telehealth: Payer: Self-pay | Admitting: Urology

## 2018-09-15 ENCOUNTER — Encounter: Payer: Self-pay | Admitting: Urology

## 2018-09-15 ENCOUNTER — Ambulatory Visit (INDEPENDENT_AMBULATORY_CARE_PROVIDER_SITE_OTHER): Payer: Medicare Other | Admitting: Urology

## 2018-09-15 VITALS — BP 137/86 | HR 73 | Ht 66.0 in | Wt 133.0 lb

## 2018-09-15 DIAGNOSIS — N35819 Other urethral stricture, male, unspecified site: Secondary | ICD-10-CM | POA: Diagnosis not present

## 2018-09-15 LAB — URINALYSIS, COMPLETE
Bilirubin, UA: NEGATIVE
GLUCOSE, UA: NEGATIVE
Ketones, UA: NEGATIVE
Leukocytes, UA: NEGATIVE
Nitrite, UA: NEGATIVE
Protein, UA: NEGATIVE
Specific Gravity, UA: 1.015 (ref 1.005–1.030)
UUROB: 0.2 mg/dL (ref 0.2–1.0)
pH, UA: 5.5 (ref 5.0–7.5)

## 2018-09-15 MED ORDER — LIDOCAINE HCL URETHRAL/MUCOSAL 2 % EX GEL
1.0000 "application " | Freq: Once | CUTANEOUS | Status: AC
  Administered 2018-09-15: 1 via URETHRAL

## 2018-09-15 NOTE — Progress Notes (Signed)
   09/15/18  CC:  Chief Complaint  Patient presents with  . Cysto    HPI: Refer to Shannon's note of 09/04/2018.  History of what sounds like either urethral stricture or bladder neck contracture.  PSA was also detectable at 0.4.  No prior PSA results for comparison.  Prior records have been requested  Blood pressure 137/86, pulse 73, height 5\' 6"  (1.676 m), weight 133 lb (60.3 kg). NED. A&Ox3.    Cystoscopy Procedure Note  Patient identification was confirmed, informed consent was obtained, and patient was prepped using Betadine solution.  Lidocaine jelly was administered per urethral meatus.     Pre-Procedure: - Inspection reveals a normal caliber urethral meatus.  Procedure: The flexible cystoscope was introduced without difficulty - No urethral strictures/lesions are present. - Surgically absent prostate  - No bladder neck contracture - Bilateral ureteral orifices identified - Bladder mucosa  reveals no ulcers, tumors, or lesions - No bladder stones - No trabeculation  Retroflexion shows no abnormalities   Post-Procedure: - Patient tolerated the procedure well  Assessment/ Plan: No evidence of urethral stricture or bladder neck contracture.  Recommend a 66-month follow-up with Larene Beach and a PSA prior to that visit.  Recommend CT abdomen pelvis for rising PSA.  Hopefully his previous records will be available by that visit.   Abbie Sons, MD

## 2018-12-18 ENCOUNTER — Other Ambulatory Visit: Payer: Self-pay

## 2018-12-18 DIAGNOSIS — Z8546 Personal history of malignant neoplasm of prostate: Secondary | ICD-10-CM

## 2018-12-19 ENCOUNTER — Other Ambulatory Visit: Payer: Medicare Other

## 2018-12-19 DIAGNOSIS — Z8546 Personal history of malignant neoplasm of prostate: Secondary | ICD-10-CM

## 2018-12-20 LAB — PSA: Prostate Specific Ag, Serum: 0.8 ng/mL (ref 0.0–4.0)

## 2018-12-21 NOTE — Progress Notes (Signed)
12/22/2018  12:26 PM   Alexander Avery 1955-12-19 [id number]  Referring provider: No referring provider defined for this encounter.  Chief Complaint  Patient presents with  . Prostate Cancer    HPI: Alexander Avery is a 63 y.o. male African American with a history of prostate cancer who presents today for a 3 month symptom recheck.  Background History Patient first presented to clinic on 09/04/2018, for difficulty with urination.  At that time, he had been having frequency x "no telling", strong urgency, dysuria, nocturia x 3-4, urge incontinece, intermittency, straining to urinate and a weak urinary stream.  Patient denied any gross hematuria or suprapubic/flank pain.  Patient denied any fevers, chills, nausea or vomiting.   His UA on 09/04/2018 was negative and his PVR was 17 mL.    He had underwent a prostatectomy in 2016 in [place], [place] for prostate cancer.  He states he was told his cancer was in the early stage.  He states that that he has had three "scrapings" through his penis in the urology office the first year after his prostate surgery.    According to records received from [facility], he had iPSA 15.0 - DRE nodularity and induration in the left lobe.  Biopsy was performed on 02/05/2015.  His prostate was 20.5 grams PSA density of 0.73.  Gleason score 6 and 7 (3+4) involving all 6 biopsies zones.  RRP on 04/21/2015 -pathology showed a Gleason score 7 involving 25% of the gland with negative surgical margins.  3 weeks after prostatectomy patient went into urinary retention.  Cystoscopy performed in July 2016 noted anterior urethra was unremarkable and the vesicular urethral anastomosis was open.  PSA was undetectable in August 2016.  He underwent urethral dilation for a stricture at the vesicourethral anastomosis on November 2016 with Leander Rams sounds.  On cytoscopy with Dr. Bernardo Heater on 09/15/2018, no evidence of urethral stricture or bladder neck contracture.  He also has  complained of erectile dysfunction, since his surgery.  He reported on that visit that he had been having a rash on his penis that occurs every three to four mouths.  He has also had issues with his BM's alternating from diarrhea and constipation.  He states that has been since surgery.    On this visit (12/22/2018), he reported frequency x 10-12 and nocturia every 2 hours.  He will be trying Myrbetriq.  Patient also reports today that for the past two weeks that he has been light-headed for two weeks and has felt like he was going to pass out occasionally.  He admits to not taking his blood sugar at home.  Advised patient to get a glucometer so he can check his blood sugar at home.  Erectile dysfunction His SHIM score is 19, which is mild erectile dysfunction.   He has been having difficulty with erections since his surgery.   His major complaint is getting an erection.  His libido is preserved.   His risk factors for ED are age, prostate cancer, DM, HTN, HLD, sleep apnea, stress, anxiety, alcohol abuse and smoking.  He denies any painful erections or curvatures with his erections.   He no longer having spontaneous erections.  He reports that he has tried Viagra in the past; he is interested in trying it again.  SHIM    Row Name 12/22/18 1123         SHIM: Over the last 6 months:   How do you rate your confidence that you could  get and keep an erection?  Low     When you had erections with sexual stimulation, how often were your erections hard enough for penetration (entering your partner)?  Almost Always or Always     During sexual intercourse, how often were you able to maintain your erection after you had penetrated (entered) your partner?  A Few Times (much less than half the time)     During sexual intercourse, how difficult was it to maintain your erection to completion of intercourse?  Not Difficult     When you attempted sexual intercourse, how often was it satisfactory for you?  Almost  Always or Always       SHIM Total Score   SHIM  19       Score: 1-7 Severe ED 8-11 Moderate ED 12-16 Mild-Moderate ED 17-21 Mild ED 22-25 No ED  PMH: Past Medical History:  Diagnosis Date  . Anxiety   . Arthritis   . Cancer [facility])    prostate  . Cataract 2019  . Diabetes mellitus without complication ([place])   . GERD (gastroesophageal reflux disease)   . Hernia of abdominal wall 2015  . History of stomach ulcers   . Hypertension   . Prostate cancer ([place])   . Seizures ([place])   . Sleep apnea     Surgical History: Past Surgical History:  Procedure Laterality Date  . CATARACT EXTRACTION W/PHACO Right 03/30/2018   Procedure: CATARACT EXTRACTION PHACO AND INTRAOCULAR LENS PLACEMENT (IOC);  Surgeon: Eulogio Bear, MD;  Location: [facility] ORS;  Service: Ophthalmology;  Laterality: Right;  [place] 01:09.5 AP% 10.4 CDE 7.98 Fluid pack lot # [id number]  . CATARACT EXTRACTION W/PHACO Left 06/29/2018   Procedure: CATARACT EXTRACTION PHACO AND INTRAOCULAR LENS PLACEMENT (I[facility]);  Surgeon: Eulogio Bear, MD;  Location: [facility] ORS;  Service: Ophthalmology;  Laterality: Left;  [place] 01:27.7 AP% 8.2 CDE 7.27 Fluid pack lot # [id number]  . EYE SURGERY    . HERNIA REPAIR    . PROSTATE SURGERY    . PROSTATE SURGERY  2016    Home Medications:  Allergies as of 12/22/2018      Reactions   Penicillins Hives, Other (See Comments)   Urinate on self, pass out Has patient had a PCN reaction causing immediate rash, facial/tongue/throat swelling, SOB or lightheadedness with hypotension: Yes Has patient had a PCN reaction causing severe rash involving mucus membranes or skin necrosis: No Has patient had a PCN reaction that required hospitalization: Yes- In hospital Has patient had a PCN reaction occurring within the last 10 years: Yes If all of the above answers are "NO", then may proceed with Cephalosporin use.      Medication List       Accurate as of December 22, 2018 12:26 PM. Always use your most  recent med list.        acetaminophen 325 MG tablet Commonly known as:  TYLENOL Take 650 mg by mouth every 6 (six) hours as needed for moderate pain or headache.   atorvastatin 40 MG tablet Commonly known as:  LIPITOR Take 40 mg by mouth at bedtime.   DUREZOL 0.05 % Emul Generic drug:  Difluprednate Place 1 drop into the right eye 2 (two) times daily.   INVOKANA 300 MG Tabs tablet Generic drug:  canagliflozin Take 300 mg by mouth daily before breakfast.   losartan-hydrochlorothiazide 100-25 MG tablet Commonly known as:  HYZAAR Take 1 tablet by mouth daily.   meclizine 12.5 MG tablet Commonly known as:  ANTIVERT Take 1 tablet (12.5 mg total) by mouth 2 (two) times daily as needed for dizziness.   metFORMIN 500 MG tablet Commonly known as:  GLUCOPHAGE Take 500 mg by mouth 2 (two) times daily with a meal.   mirabegron ER 25 MG Tb24 tablet Commonly known as:  MYRBETRIQ Take 1 tablet (25 mg total) by mouth daily.   sildenafil 20 MG tablet Commonly known as:  REVATIO Take 3 to 5 tablets two hours before intercouse on an empty stomach.  Do not take with nitrates.       Allergies:  Allergies  Allergen Reactions  . Penicillins Hives and Other (See Comments)    Urinate on self, pass out Has patient had a PCN reaction causing immediate rash, facial/tongue/throat swelling, SOB or lightheadedness with hypotension: Yes Has patient had a PCN reaction causing severe rash involving mucus membranes or skin necrosis: No Has patient had a PCN reaction that required hospitalization: Yes- In hospital Has patient had a PCN reaction occurring within the last 10 years: Yes If all of the above answers are "NO", then may proceed with Cephalosporin use.     Family History: Family History  Problem Relation Age of Onset  . Cancer Mother     Social History:  reports that he has been smoking. He has been smoking about 2.00 packs per day. He has never used smokeless tobacco. He reports  current alcohol use of about 2.0 standard drinks of alcohol per week. He reports that he does not use drugs.  ROS: UROLOGY Frequent Urination?: Yes Hard to postpone urination?: No Burning/pain with urination?: No Get up at night to urinate?: Yes Leakage of urine?: Yes Urine stream starts and stops?: Yes Trouble starting stream?: No Do you have to strain to urinate?: No Blood in urine?: No Urinary tract infection?: No Sexually transmitted disease?: No Injury to kidneys or bladder?: No Painful intercourse?: No Weak stream?: No Erection problems?: No Penile pain?: No  Gastrointestinal Nausea?: No Vomiting?: No Indigestion/heartburn?: No Diarrhea?: No Constipation?: No  Constitutional Fever: No Night sweats?: Yes Weight loss?: No Fatigue?: No  Skin Skin rash/lesions?: No Itching?: No  Eyes Blurred vision?: Yes Double vision?: No  Ears/Nose/Throat Sore throat?: No Sinus problems?: No  Hematologic/Lymphatic Swollen glands?: No Easy bruising?: No  Cardiovascular Leg swelling?: No Chest pain?: No  Respiratory Cough?: Yes Shortness of breath?: Yes  Endocrine Excessive thirst?: Yes  Musculoskeletal Back pain?: Yes Joint pain?: Yes  Neurological Headaches?: Yes Dizziness?: Yes  Psychologic Depression?: No Anxiety?: No  Physical Exam: BP 106/62 (BP Location: Left Arm, Patient Position: Sitting, Cuff Size: Normal)   Pulse 68   Ht 5\' 6"  (1.676 m)   Wt 137 lb (62.1 kg)   BMI 22.11 kg/m   Constitutional:  Well nourished. Alert and oriented, No acute distress. Cardiovascular: No clubbing, cyanosis, or edema. Respiratory: Normal respiratory effort, no increased work of breathing. GU: No CVA tenderness.  No bladder fullness or masses.  Patient with uncircumcised phallus.  Foreskin easily retracted.  Urethral meatus is patent.  No penile discharge. No penile lesions or rashes. Scrotum without lesions, cysts, rashes and/or edema.  Testicles are located  scrotally bilaterally. No masses are appreciated in the testicles. Left and right epididymis are normal. Rectal: Patient with normal sphincter tone.  Anus and perineum without scarring or rashes.  No rectal masses are appreciated. Prostate is surgically absent.   Skin: No rashes, bruises or suspicious lesions. Neurologic: Grossly intact, no focal deficits, moving all 4 extremities. Psychiatric:  Normal mood and affect.  Laboratory Data: Lab Results  Component Value Date   WBC 6.7 06/28/2018   HGB 12.1 (L) 06/28/2018   HCT 34.3 (L) 06/28/2018   MCV 86.0 06/28/2018   PLT 170 06/28/2018    Lab Results  Component Value Date   CREATININE 1.57 (H) 06/28/2018   Results for SIVAN, QUAST (MRN [id number]) as of 12/21/2018 17:00  Ref. Range 09/04/2018 14:30 12/19/2018 11:05  Prostate Specific Ag, Serum Latest Ref Range: 0.0 - 4.0 ng/mL 0.4 0.8   I have reviewed the labs.   Pertinent Imaging: Results for CAN, LUCCI (MRN [id number]) as of 09/04/2018 15:12  Ref. Range 09/04/2018 14:49 12/22/2018 11:24  Scan Result Unknown 17 15     Assessment & Plan:    1. Prostate cancer - Explained to the patient that his PSA indicates that he likely has a biochemical recurrence of his prostate cancer - Explained salvage radiation and offered a referral to the cancer center; patient would prefer not to pursue at this time - Explained to the patient what the alternates are, such as CT scans and bone scans to see if it has metastased - Explained to the patient ADT therapy; patient is reluctant - At this point, patient has elected to have CT and bone scans and to defer further treatment decisions for after he has results -RTC for results   2. LUTS - I have advised the patient of the side effects of Myrbetriq, such as: elevation in BP, urinary retention and/or HA. - Patient has been given Myrbetriq samples, #28 to do a trial. -RTC in 3 weeks for I PSS and PVR  3. Erectile dysfunction - SHIM  score is 19 - He would like to try Viagra, having had success with it prior to his surgery - RTC in 3 weeks for SHIM   Return in about 3 weeks (around 01/12/2019) for IPSS and PVR.  These notes generated with voice recognition software. I apologize for typographical errors.  [facility] [address]  [place], [place] [address] [phone]  I, Adele Schilder, am acting as a Education administrator for [employer], PA-C.   I have reviewed the above documentation for accuracy and completeness, and I agree with the above.    [name], PA-C

## 2018-12-22 ENCOUNTER — Ambulatory Visit (INDEPENDENT_AMBULATORY_CARE_PROVIDER_SITE_OTHER): Payer: Medicare Other | Admitting: Urology

## 2018-12-22 ENCOUNTER — Encounter: Payer: Self-pay | Admitting: Urology

## 2018-12-22 VITALS — BP 106/62 | HR 68 | Ht 66.0 in | Wt 137.0 lb

## 2018-12-22 DIAGNOSIS — R9721 Rising PSA following treatment for malignant neoplasm of prostate: Secondary | ICD-10-CM | POA: Diagnosis not present

## 2018-12-22 DIAGNOSIS — N35819 Other urethral stricture, male, unspecified site: Secondary | ICD-10-CM

## 2018-12-22 DIAGNOSIS — R3915 Urgency of urination: Secondary | ICD-10-CM

## 2018-12-22 DIAGNOSIS — C61 Malignant neoplasm of prostate: Secondary | ICD-10-CM

## 2018-12-22 LAB — BLADDER SCAN AMB NON-IMAGING: Scan Result: 15

## 2018-12-22 MED ORDER — MIRABEGRON ER 25 MG PO TB24: 25.0000 mg | ORAL_TABLET | Freq: Every day | ORAL | 0 refills | Status: DC

## 2018-12-22 MED ORDER — SILDENAFIL CITRATE 20 MG PO TABS: ORAL_TABLET | ORAL | 3 refills | Status: DC

## 2019-01-16 ENCOUNTER — Encounter: Payer: Self-pay | Admitting: Urology

## 2019-01-16 ENCOUNTER — Ambulatory Visit: Payer: Medicare Other | Admitting: Urology

## 2019-01-16 NOTE — Progress Notes (Incomplete)
12/22/2018  8:57 AM   Alexander Avery 06-27-1956 [id number]  Referring provider: No referring provider defined for this encounter.  No chief complaint on file.   HPI: Alexander Avery is a 63 y.o. male African American with a history of prostate cancer who presents today for a 3 month symptom recheck.  Background History Patient first presented to clinic on 09/04/2018, for difficulty with urination.  At that time, he had been having frequency x "no telling", strong urgency, dysuria, nocturia x 3-4, urge incontinece, intermittency, straining to urinate and a weak urinary stream.  Patient denied any gross hematuria or suprapubic/flank pain.  Patient denied any fevers, chills, nausea or vomiting.   His UA on 09/04/2018 was negative and his PVR was 17 mL.    He had underwent a prostatectomy in 2016 in [place], [place] for prostate cancer.  He states he was told his cancer was in the early stage.  He states that that he has had three "scrapings" through his penis in the urology office the first year after his prostate surgery.    According to records received from [facility], he had iPSA 15.0 - DRE nodularity and induration in the left lobe.  Biopsy was performed on 02/05/2015.  His prostate was 20.5 grams PSA density of 0.73.  Gleason score 6 and 7 (3+4) involving all 6 biopsies zones.  RRP on 04/21/2015 -pathology showed a Gleason score 7 involving 25% of the gland with negative surgical margins.  3 weeks after prostatectomy patient went into urinary retention.  Cystoscopy performed in July 2016 noted anterior urethra was unremarkable and the vesicular urethral anastomosis was open.  PSA was undetectable in August 2016.  He underwent urethral dilation for a stricture at the vesicourethral anastomosis on November 2016 with Leander Rams sounds.  On cytoscopy with Dr. Bernardo Heater on 09/15/2018, no evidence of urethral stricture or bladder neck contracture.  He also has complained of erectile  dysfunction, since his surgery.  He reported on that visit that he had been having a rash on his penis that occurs every three to four mouths.  He has also had issues with his BM's alternating from diarrhea and constipation.  He states that has been since surgery.    On this visit (12/22/2018), he reported frequency x 10-12 and nocturia every 2 hours.  He will be trying Myrbetriq.  Patient also reports today that for the past two weeks that he has been light-headed for two weeks and has felt like he was going to pass out occasionally.  He admits to not taking his blood sugar at home.  Advised patient to get a glucometer so he can check his blood sugar at home.  Erectile dysfunction His SHIM score is ***, which is ***.   His previous SHIM score was 19.  He has been having difficulty with erections since his surgery   His major complaint is getting an erection.  His libido is preserved.  His risk factors for ED are age, prostate cancer, DM, HTN, HLD, sleep apnea, stress, anxiety, alcohol abuse and smoking. *** He denies any painful erections or curvatures with his erections.   He is no longer having spontaneous erections.  He has tried Viagra in the past.     Score: 1-7 Severe ED 8-11 Moderate ED 12-16 Mild-Moderate ED 17-21 Mild ED 22-25 No ED     PMH: Past Medical History:  Diagnosis Date   Anxiety    Arthritis    Cancer ([place])  prostate   Cataract 2019   Diabetes mellitus without complication ([facility])    GERD (gastroesophageal reflux disease)    Hernia of abdominal wall 2015   History of stomach ulcers    Hypertension    Prostate cancer ([place])    Seizures ([place])    Sleep apnea     Surgical History: Past Surgical History:  Procedure Laterality Date   CATARACT EXTRACTION W/PHACO Right 03/30/2018   Procedure: CATARACT EXTRACTION PHACO AND INTRAOCULAR LENS PLACEMENT ([place]);  Surgeon: Eulogio Bear, MD;  Location: [facility] ORS;  Service: Ophthalmology;  Laterality:  Right;  [place] 01:09.5 AP% 10.4 CDE 7.98 Fluid pack lot # [id number]   CATARACT EXTRACTION W/PHACO Left 06/29/2018   Procedure: CATARACT EXTRACTION PHACO AND INTRAOCULAR LENS PLACEMENT (IOC);  Surgeon: Eulogio Bear, MD;  Location: [facility] ORS;  Service: Ophthalmology;  Laterality: Left;  [place] 01:27.7 AP% 8.2 CDE 7.27 Fluid pack lot # [id number]   EYE SURGERY     HERNIA REPAIR     PROSTATE SURGERY     PROSTATE SURGERY  2016    Home Medications:  Allergies as of 01/16/2019      Reactions   Penicillins Hives, Other (See Comments)   Urinate on self, pass out Has patient had a PCN reaction causing immediate rash, facial/tongue/throat swelling, SOB or lightheadedness with hypotension: Yes Has patient had a PCN reaction causing severe rash involving mucus membranes or skin necrosis: No Has patient had a PCN reaction that required hospitalization: Yes- In hospital Has patient had a PCN reaction occurring within the last 10 years: Yes If all of the above answers are "NO", then may proceed with Cephalosporin use.      Medication List       Accurate as of January 16, 2019  8:57 AM. Always use your most recent med list.        acetaminophen 325 MG tablet Commonly known as:  TYLENOL Take 650 mg by mouth every 6 (six) hours as needed for moderate pain or headache.   atorvastatin 40 MG tablet Commonly known as:  LIPITOR Take 40 mg by mouth at bedtime.   DUREZOL 0.05 % Emul Generic drug:  Difluprednate Place 1 drop into the right eye 2 (two) times daily.   INVOKANA 300 MG Tabs tablet Generic drug:  canagliflozin Take 300 mg by mouth daily before breakfast.   losartan-hydrochlorothiazide 100-25 MG tablet Commonly known as:  HYZAAR Take 1 tablet by mouth daily.   meclizine 12.5 MG tablet Commonly known as:  ANTIVERT Take 1 tablet (12.5 mg total) by mouth 2 (two) times daily as needed for dizziness.   metFORMIN 500 MG tablet Commonly known as:  GLUCOPHAGE Take 500 mg by mouth 2 (two)  times daily with a meal.   mirabegron ER 25 MG Tb24 tablet Commonly known as:  MYRBETRIQ Take 1 tablet (25 mg total) by mouth daily.   sildenafil 20 MG tablet Commonly known as:  REVATIO Take 3 to 5 tablets two hours before intercouse on an empty stomach.  Do not take with nitrates.       Allergies:  Allergies  Allergen Reactions   Penicillins Hives and Other (See Comments)    Urinate on self, pass out Has patient had a PCN reaction causing immediate rash, facial/tongue/throat swelling, SOB or lightheadedness with hypotension: Yes Has patient had a PCN reaction causing severe rash involving mucus membranes or skin necrosis: No Has patient had a PCN reaction that required hospitalization: Yes- In hospital Has patient had  a PCN reaction occurring within the last 10 years: Yes If all of the above answers are "NO", then may proceed with Cephalosporin use.     Family History: Family History  Problem Relation Age of Onset   Cancer Mother     Social History:  reports that he has been smoking. He has been smoking about 2.00 packs per day. He has never used smokeless tobacco. He reports current alcohol use of about 2.0 standard drinks of alcohol per week. He reports that he does not use drugs.  ROS:                                        Physical Exam: There were no vitals taken for this visit.  Constitutional:  Well nourished. Alert and oriented, No acute distress. {HEENT: [place] AT, moist mucus membranes.  Trachea midline, no masses.} Cardiovascular: No clubbing, cyanosis, or edema. Respiratory: Normal respiratory effort, no increased work of breathing. {GI: Abdomen is soft, non tender, non distended, no abdominal masses. Liver and spleen not palpable.  No hernias appreciated.  Stool sample for occult testing is not indicated.} {GU: No CVA tenderness.  No bladder fullness or masses.  Patient with circumcised/uncircumcised phallus. ***Foreskin easily  retracted***  Urethral meatus is patent.  No penile discharge. No penile lesions or rashes. Scrotum without lesions, cysts, rashes and/or edema.  Testicles are located scrotally bilaterally. No masses are appreciated in the testicles. Left and right epididymis are normal.} {Rectal: Patient with normal sphincter tone.  Anus and perineum without scarring or rashes.  No rectal masses are appreciated. Prostate is approximately *** grams, *** nodules are appreciated.  Seminal vesicles are normal.} Skin: No rashes, bruises or suspicious lesions. {Lymph: No cervical or inguinal adenopathy.} Neurologic: Grossly intact, no focal deficits, moving all 4 extremities. Psychiatric: Normal mood and affect.  Laboratory Data: Lab Results  Component Value Date   WBC 6.7 06/28/2018   HGB 12.1 (L) 06/28/2018   HCT 34.3 (L) 06/28/2018   MCV 86.0 06/28/2018   PLT 170 06/28/2018    Lab Results  Component Value Date   CREATININE 1.57 (H) 06/28/2018   Results for GOTTLIEB, ZUERCHER (MRN [id number]) as of 12/21/2018 17:00  Ref. Range 09/04/2018 14:30 12/19/2018 11:05  Prostate Specific Ag, Serum Latest Ref Range: 0.0 - 4.0 ng/mL 0.4 0.8   I have reviewed the labs.   Pertinent Imaging: Results for HARNOOR, KOHLES (MRN [id number]) as of 09/04/2018 15:12  Ref. Range 09/04/2018 14:49 12/22/2018 11:24  Scan Result Unknown 17 15     Assessment & Plan:    1. Prostate cancer *** - Explained to the patient that his PSA indicates that he likely has a biochemical recurrence of his prostate cancer *** - Explained salvage radiation and offered a referral to the cancer center; patient would prefer not to pursue at this time *** - Explained to the patient what the alternates are, such as CT scans and bone scans to see if it has metastased *** - Explained to the patient ADT therapy; patient is reluctant *** - At this point, patient has elected to have CT and bone scans and to defer further treatment decisions for  after he has results *** - RTC for results   2. LUTS *** - I have advised the patient of the side effects of Myrbetriq, such as: elevation in BP, urinary retention and/or HA. *** - Patient  has been given Myrbetriq samples, #28 to do a trial. *** - RTC in 3 weeks for I PSS and PVR  3. Erectile dysfunction - SHIM score is {19} *** - He would like to try Viagra, having had success with it prior to his surgery *** - RTC in 3 weeks for SHIM   No follow-ups on file.  [name], PA-C  [facility] [address]  [place], [place] [address] [phone]  I, Adele Schilder, am acting as a Education administrator for [employer], PA-C.   {Add Scribe Attestation Statement}

## 2019-01-18 ENCOUNTER — Telehealth: Payer: Self-pay | Admitting: Urology

## 2019-01-18 NOTE — Telephone Encounter (Signed)
Would you check on the status of Alexander Avery CT scan and bone scan?

## 2019-01-19 NOTE — Telephone Encounter (Signed)
They have called the patient 3 times since 12-27-18 and he has not returned any of the phone calls to schedule    Sharyn Lull

## 2019-01-23 NOTE — Progress Notes (Incomplete)
01/25/2019 3:24 PM   Alexander Avery [birth year] [id number]  Referring provider: No referring provider defined for this encounter.  No chief complaint on file.   HPI: Alexander Avery is a 63 y.o. male African American with a history of prostate cancer, erectile dysfunction, and LUTS who presents today for a 3 week symptom recheck on his LUTS symptoms.  Background History Patient first presented to clinic on 09/04/2018, for difficulty with urination.  At that time, he had been having frequency x "no telling", strong urgency, dysuria, nocturia x 3-4, urge incontinece, intermittency, straining to urinate and a weak urinary stream.  Patient denied any gross hematuria or suprapubic/flank pain.  Patient denied any fevers, chills, nausea or vomiting.   His UA on 09/04/2018 was negative and his PVR was 17 mL.    He had underwent a prostatectomy in 2016 in [place], [place] for prostate cancer.  He states he was told his cancer was in the early stage.  He states that that he has had three "scrapings" through his penis in the urology office the first year after his prostate surgery.    According to records received from [facility], he had iPSA 15.0 - DRE nodularity and induration in the left lobe.  Biopsy was performed on 02/05/2015.  His prostate was 20.5 grams PSA density of 0.73.  Gleason score 6 and 7 (3+4) involving all 6 biopsies zones.  RRP on 04/21/2015 -pathology showed a Gleason score 7 involving 25% of the gland with negative surgical margins.  3 weeks after prostatectomy patient went into urinary retention.  Cystoscopy performed in July 2016 noted anterior urethra was unremarkable and the vesicular urethral anastomosis was open.  PSA was undetectable in August 2016.  He underwent urethral dilation for a stricture at the vesicourethral anastomosis on November 2016 with Leander Rams sounds.  On cytoscopy with Dr. Bernardo Heater on 09/15/2018, no evidence of urethral stricture or bladder neck  contracture.  He also has complained of erectile dysfunction, since his surgery.  He reported on that visit that he had been having a rash on his penis that occurs every three to four mouths.  He has also had issues with his BM's alternating from diarrhea and constipation.  He states that has been since surgery.    On his 12/22/2018 visit, he reported frequency x 10-12 and nocturia every 2 hours.  He will be trying Myrbetriq.  Patient also reported that for the past two weeks that he has been light-headed for two weeks and has felt like he was going to pass out occasionally.  He admitted to not taking his blood sugar at home.  Advised patient to get a glucometer so he can check his blood sugar at home.  LUTS IPSS score: *** PVR: ***  Previous PVR: 15 mL  Major complaint(s):  x *** years. Denies any dysuria, hematuria or suprapubic pain.   Currently taking: Myrbetriq 25 mg.  His has had ***.   Denies any recent fevers, chills, nausea or vomiting.  He does not have a family history of PCa, but does have a personal history of PCa; see above.    Score:  1-7 Mild 8-19 Moderate 20-35 Severe  Erectile dysfunction His SHIM score on 12/22/2018 was 19, which is mild erectile dysfunction.  He has been having difficulty with erections since his surgery.  His major complaint is getting an erection.  His libido is preserved.  His risk factors for ED are age, prostate cancer, DM, HTN, HLD, sleep  apnea, stress, anxiety, alcohol abuse and smoking.  He denies any painful erections or curvatures with his erections.  He no longer having spontaneous erections.  He reports that he has tried Viagra in the past; he is interested in trying it again.   Score: 1-7 Severe ED 8-11 Moderate ED 12-16 Mild-Moderate ED 17-21 Mild ED 22-25 No ED  PMH: Past Medical History:  Diagnosis Date   Anxiety    Arthritis    Cancer ([place])    prostate   Cataract 2019   Diabetes mellitus without complication (HCC)      GERD (gastroesophageal reflux disease)    Hernia of abdominal wall 2015   History of stomach ulcers    Hypertension    Prostate cancer ([place])    Seizures ([place])    Sleep apnea     Surgical History: Past Surgical History:  Procedure Laterality Date   CATARACT EXTRACTION W/PHACO Right 03/30/2018   Procedure: CATARACT EXTRACTION PHACO AND INTRAOCULAR LENS PLACEMENT ([place]);  Surgeon: Eulogio Bear, MD;  Location: [facility] ORS;  Service: Ophthalmology;  Laterality: Right;  [place] 01:09.5 AP% 10.4 CDE 7.98 Fluid pack lot # [id number]   CATARACT EXTRACTION W/PHACO Left 06/29/2018   Procedure: CATARACT EXTRACTION PHACO AND INTRAOCULAR LENS PLACEMENT ([facility]);  Surgeon: Eulogio Bear, MD;  Location: [facility] ORS;  Service: Ophthalmology;  Laterality: Left;  [place] 01:27.7 AP% 8.2 CDE 7.27 Fluid pack lot # [id number]   EYE SURGERY     HERNIA REPAIR     PROSTATE SURGERY     PROSTATE SURGERY  2016    Home Medications:  Allergies as of 01/25/2019      Reactions   Penicillins Hives, Other (See Comments)   Urinate on self, pass out Has patient had a PCN reaction causing immediate rash, facial/tongue/throat swelling, SOB or lightheadedness with hypotension: Yes Has patient had a PCN reaction causing severe rash involving mucus membranes or skin necrosis: No Has patient had a PCN reaction that required hospitalization: Yes- In hospital Has patient had a PCN reaction occurring within the last 10 years: Yes If all of the above answers are "NO", then may proceed with Cephalosporin use.      Medication List       Accurate as of January 23, 2019  3:24 PM. Always use your most recent med list.        acetaminophen 325 MG tablet Commonly known as:  TYLENOL Take 650 mg by mouth every 6 (six) hours as needed for moderate pain or headache.   atorvastatin 40 MG tablet Commonly known as:  LIPITOR Take 40 mg by mouth at bedtime.   Durezol 0.05 % Emul Generic drug:  Difluprednate Place 1 drop  into the right eye 2 (two) times daily.   Invokana 300 MG Tabs tablet Generic drug:  canagliflozin Take 300 mg by mouth daily before breakfast.   losartan-hydrochlorothiazide 100-25 MG tablet Commonly known as:  HYZAAR Take 1 tablet by mouth daily.   meclizine 12.5 MG tablet Commonly known as:  ANTIVERT Take 1 tablet (12.5 mg total) by mouth 2 (two) times daily as needed for dizziness.   metFORMIN 500 MG tablet Commonly known as:  GLUCOPHAGE Take 500 mg by mouth 2 (two) times daily with a meal.   mirabegron ER 25 MG Tb24 tablet Commonly known as:  MYRBETRIQ Take 1 tablet (25 mg total) by mouth daily.   sildenafil 20 MG tablet Commonly known as:  REVATIO Take 3 to 5 tablets two hours before intercouse on  an empty stomach.  Do not take with nitrates.       Allergies:  Allergies  Allergen Reactions   Penicillins Hives and Other (See Comments)    Urinate on self, pass out Has patient had a PCN reaction causing immediate rash, facial/tongue/throat swelling, SOB or lightheadedness with hypotension: Yes Has patient had a PCN reaction causing severe rash involving mucus membranes or skin necrosis: No Has patient had a PCN reaction that required hospitalization: Yes- In hospital Has patient had a PCN reaction occurring within the last 10 years: Yes If all of the above answers are "NO", then may proceed with Cephalosporin use.     Family History: Family History  Problem Relation Age of Onset   Cancer Mother     Social History:  reports that he has been smoking. He has been smoking about 2.00 packs per day. He has never used smokeless tobacco. He reports current alcohol use of about 2.0 standard drinks of alcohol per week. He reports that he does not use drugs.  ROS:                                        Physical Exam: There were no vitals taken for this visit.  Constitutional:  Well nourished. Alert and oriented, No acute distress. {HEENT: [place]  AT, moist mucus membranes.  Trachea midline, no masses.} Cardiovascular: No clubbing, cyanosis, or edema. Respiratory: Normal respiratory effort, no increased work of breathing. {GI: Abdomen is soft, non tender, non distended, no abdominal masses. Liver and spleen not palpable.  No hernias appreciated.  Stool sample for occult testing is not indicated.} {GU: No CVA tenderness.  No bladder fullness or masses.  Patient with circumcised/uncircumcised phallus. ***Foreskin easily retracted***  Urethral meatus is patent.  No penile discharge. No penile lesions or rashes. Scrotum without lesions, cysts, rashes and/or edema.  Testicles are located scrotally bilaterally. No masses are appreciated in the testicles. Left and right epididymis are normal.} {Rectal: Patient with normal sphincter tone.  Anus and perineum without scarring or rashes.  No rectal masses are appreciated. Prostate is approximately *** grams, *** nodules are appreciated.  Seminal vesicles are normal.} Skin: No rashes, bruises or suspicious lesions. {Lymph: No cervical or inguinal adenopathy.} Neurologic: Grossly intact, no focal deficits, moving all 4 extremities. Psychiatric: Normal mood and affect.  Laboratory Data: Lab Results  Component Value Date   WBC 6.7 06/28/2018   HGB 12.1 (L) 06/28/2018   HCT 34.3 (L) 06/28/2018   MCV 86.0 06/28/2018   PLT 170 06/28/2018    Lab Results  Component Value Date   CREATININE 1.57 (H) 06/28/2018   Results for MARQUIZ, SOTELO (MRN [id number]) as of 12/21/2018 17:00  Ref. Range 09/04/2018 14:30 12/19/2018 11:05  Prostate Specific Ag, Serum Latest Ref Range: 0.0 - 4.0 ng/mL 0.4 0.8   I have reviewed the labs.   Pertinent Imaging: Results for LEOTHA, VOELTZ (MRN [id number]) as of 09/04/2018 15:12  Ref. Range 09/04/2018 14:49 12/22/2018 11:24  Scan Result Unknown 17 15     Assessment & Plan:    1. Prostate cancer *** - Explained to the patient that his PSA indicates that he  likely has a biochemical recurrence of his prostate cancer *** - Explained salvage radiation and offered a referral to the cancer center; patient would prefer not to pursue at this time *** - Explained to the patient what the  alternates are, such as CT scans and bone scans to see if it has metastased *** - Explained to the patient ADT therapy; patient is reluctant *** - At this point, patient has elected to have CT and bone scans and to defer further treatment decisions for after he has results *** - RTC for results   2. LUTS *** - I have advised the patient of the side effects of Myrbetriq, such as: elevation in BP, urinary retention and/or HA. *** - Patient has been given Myrbetriq samples, #28 to do a trial. *** - RTC in 3 weeks for I PSS and PVR  3. Erectile dysfunction - SHIM score is {19} *** - He would like to try Viagra, having had success with it prior to his surgery *** - RTC in 3 weeks for SHIM   No follow-ups on file.  [name], PA-C  [facility] [address]  [place], [place] [address] [phone]  I, Adele Schilder, am acting as a Education administrator for [employer], PA-C.   {Add Scribe Attestation Statement}

## 2019-01-25 ENCOUNTER — Ambulatory Visit: Payer: Medicare Other | Admitting: Urology

## 2019-01-29 NOTE — Progress Notes (Signed)
01/31/2019 11:53 AM   Alexander Avery 12/26/1955 [id number]  Referring provider: No referring provider defined for this encounter.  Chief Complaint  Patient presents with  . Follow-up    HPI: Alexander Avery is a 63 y.o. male African American with a history of prostate cancer who presents today for a 3 week symptom recheck.  Background History Patient first presented to clinic on 09/04/2018, for difficulty with urination.  At that time, he had been having frequency x "no telling", strong urgency, dysuria, nocturia x 3-4, urge incontinece, intermittency, straining to urinate and a weak urinary stream.  Patient denied any gross hematuria or suprapubic/flank pain.  Patient denied any fevers, chills, nausea or vomiting.   His UA on 09/04/2018 was negative and his PVR was 17 mL.    He had underwent a prostatectomy in 2016 in [place], [place] for prostate cancer.  He states he was told his cancer was in the early stage.  He states that that he has had three "scrapings" through his penis in the urology office the first year after his prostate surgery.    According to records received from [facility], he had iPSA 15.0 - DRE nodularity and induration in the left lobe.  Biopsy was performed on 02/05/2015.  His prostate was 20.5 grams PSA density of 0.73.  Gleason score 6 and 7 (3+4) involving all 6 biopsies zones.  RRP on 04/21/2015 -pathology showed a Gleason score 7 involving 25% of the gland with negative surgical margins.  3 weeks after prostatectomy patient went into urinary retention.  Cystoscopy performed in July 2016 noted anterior urethra was unremarkable and the vesicular urethral anastomosis was open.  PSA was undetectable in August 2016.  He underwent urethral dilation for a stricture at the vesicourethral anastomosis on November 2016 with Leander Rams sounds.  On cytoscopy with Dr. Bernardo Heater on 09/15/2018, no evidence of urethral stricture or bladder neck contracture.  He also has  complained of erectile dysfunction, since his surgery.  He reported on that visit that he had been having a rash on his penis that occurs every three to four mouths.  He has also had issues with his BM's alternating from diarrhea and constipation.  He states that has been since surgery.    On his 12/22/2018 visit, he reported frequency x 10-12 and nocturia every 2 hours.  He will be trying Myrbetriq.  Patient also reported that for the past two weeks that he has been light-headed for two weeks and has felt like he was going to pass out occasionally.  He admits to not taking his blood sugar at home.  Advised patient to get a glucometer so he can check his blood sugar at home.  BPH WITH LUTS  (prostate and/or bladder) IPSS score: 35/6  PVR: 0 mL  Denies any dysuria, hematuria or suprapubic pain.   Currently taking: Myrbetriq 25 mg.  His has had some improvement on Myrbetriq, though he's not been able to manage to take it daily; he had reduced frequency and urgency.  Patient reports that his blood sugar is around 108, 109 now.  Patient says that he has been contacted regarding scheduling the CT scan; he was agreeable to   Patient says he has been feeling over the last month or month and a half that his 'rectum is hanging out' (he admitted hemorrhoids) and pain where his prostate had been.  Patient says he has informed his PCP (in July) about the bowel movement pain, and was not  provided with a referral at that time with a referral to a gastroenterologist.  Patient was advised to contact his PCP.  Denies any recent fevers, chills, nausea or vomiting.  He does not have a family history of PCa, but does have a family history of it.  IPSS    Row Name 01/31/19 1100         International Prostate Symptom Score   How often have you had the sensation of not emptying your bladder?  Almost always     How often have you had to urinate less than every two hours?  Almost always     How often have you  found you stopped and started again several times when you urinated?  Almost always     How often have you found it difficult to postpone urination?  Almost always     How often have you had a weak urinary stream?  Almost always     How often have you had to strain to start urination?  Almost always     How many times did you typically get up at night to urinate?  5 Times     Total IPSS Score  35       Quality of Life due to urinary symptoms   If you were to spend the rest of your life with your urinary condition just the way it is now how would you feel about that?  Unhappy       Score:  1-7 Mild 8-19 Moderate 20-35 Severe  Erectile dysfunction His SHIM score on 12/22/2018 was 19, which is mild erectile dysfunction.   He has been having difficulty with erections since his surgery.   His major complaint is getting an erection.  His libido is preserved.   His risk factors for ED are age, prostate cancer, DM, HTN, HLD, sleep apnea, stress, anxiety, alcohol abuse and smoking.  He denies any painful erections or curvatures with his erections.   He no longer having spontaneous erections.  He reports that he has tried Viagra in the past; he was interested in trying it again.  PMH: Past Medical History:  Diagnosis Date  . Anxiety   . Arthritis   . Cancer [facility])    prostate  . Cataract 2019  . Diabetes mellitus without complication ([place])   . GERD (gastroesophageal reflux disease)   . Hernia of abdominal wall 2015  . History of stomach ulcers   . Hypertension   . Prostate cancer ([place])   . Seizures ([place])   . Sleep apnea     Surgical History: Past Surgical History:  Procedure Laterality Date  . CATARACT EXTRACTION W/PHACO Right 03/30/2018   Procedure: CATARACT EXTRACTION PHACO AND INTRAOCULAR LENS PLACEMENT ([facility]);  Surgeon: Eulogio Bear, MD;  Location: [facility] ORS;  Service: Ophthalmology;  Laterality: Right;  [place] 01:09.5 AP% 10.4 CDE 7.98 Fluid pack lot # [id number]  . CATARACT  EXTRACTION W/PHACO Left 06/29/2018   Procedure: CATARACT EXTRACTION PHACO AND INTRAOCULAR LENS PLACEMENT ([facility]);  Surgeon: Eulogio Bear, MD;  Location: [facility] ORS;  Service: Ophthalmology;  Laterality: Left;  [place] 01:27.7 AP% 8.2 CDE 7.27 Fluid pack lot # [id number]  . EYE SURGERY    . HERNIA REPAIR    . PROSTATE SURGERY    . PROSTATE SURGERY  2016    Home Medications:  Allergies as of 01/31/2019      Reactions   Penicillins Hives, Other (See Comments)   Urinate on self, pass out Has  patient had a PCN reaction causing immediate rash, facial/tongue/throat swelling, SOB or lightheadedness with hypotension: Yes Has patient had a PCN reaction causing severe rash involving mucus membranes or skin necrosis: No Has patient had a PCN reaction that required hospitalization: Yes- In hospital Has patient had a PCN reaction occurring within the last 10 years: Yes If all of the above answers are "NO", then may proceed with Cephalosporin use.      Medication List       Accurate as of January 31, 2019 11:53 AM. Always use your most recent med list.        acetaminophen 325 MG tablet Commonly known as:  TYLENOL Take 650 mg by mouth every 6 (six) hours as needed for moderate pain or headache.   atorvastatin 40 MG tablet Commonly known as:  LIPITOR Take 40 mg by mouth at bedtime.   Durezol 0.05 % Emul Generic drug:  Difluprednate Place 1 drop into the right eye 2 (two) times daily.   Invokana 300 MG Tabs tablet Generic drug:  canagliflozin Take 300 mg by mouth daily before breakfast.   losartan-hydrochlorothiazide 100-25 MG tablet Commonly known as:  HYZAAR Take 1 tablet by mouth daily.   meclizine 12.5 MG tablet Commonly known as:  ANTIVERT Take 1 tablet (12.5 mg total) by mouth 2 (two) times daily as needed for dizziness.   metFORMIN 500 MG tablet Commonly known as:  GLUCOPHAGE Take 500 mg by mouth 2 (two) times daily with a meal.   mirabegron ER 25 MG Tb24 tablet Commonly known  as:  MYRBETRIQ Take 1 tablet (25 mg total) by mouth daily.   mirabegron ER 25 MG Tb24 tablet Commonly known as:  MYRBETRIQ Take 1 tablet (25 mg total) by mouth daily.   sildenafil 20 MG tablet Commonly known as:  REVATIO Take 3 to 5 tablets two hours before intercouse on an empty stomach.  Do not take with nitrates.       Allergies:  Allergies  Allergen Reactions  . Penicillins Hives and Other (See Comments)    Urinate on self, pass out Has patient had a PCN reaction causing immediate rash, facial/tongue/throat swelling, SOB or lightheadedness with hypotension: Yes Has patient had a PCN reaction causing severe rash involving mucus membranes or skin necrosis: No Has patient had a PCN reaction that required hospitalization: Yes- In hospital Has patient had a PCN reaction occurring within the last 10 years: Yes If all of the above answers are "NO", then may proceed with Cephalosporin use.     Family History: Family History  Problem Relation Age of Onset  . Cancer Mother     Social History:  reports that he has been smoking. He has been smoking about 2.00 packs per day. He has never used smokeless tobacco. He reports current alcohol use of about 2.0 standard drinks of alcohol per week. He reports that he does not use drugs.  ROS: UROLOGY Frequent Urination?: Yes Hard to postpone urination?: Yes Burning/pain with urination?: Yes Get up at night to urinate?: Yes Leakage of urine?: Yes Urine stream starts and stops?: No Trouble starting stream?: Yes Do you have to strain to urinate?: No Blood in urine?: No Urinary tract infection?: No Sexually transmitted disease?: No Injury to kidneys or bladder?: No Painful intercourse?: No Weak stream?: Yes Erection problems?: No Penile pain?: No  Gastrointestinal Nausea?: No Vomiting?: No Indigestion/heartburn?: No Diarrhea?: No Constipation?: No  Constitutional Fever: No Night sweats?: No Weight loss?: No Fatigue?: Yes   Skin  Skin rash/lesions?: No Itching?: No  Eyes Blurred vision?: No Double vision?: No  Ears/Nose/Throat Sore throat?: No Sinus problems?: No  Hematologic/Lymphatic Swollen glands?: No Easy bruising?: No  Cardiovascular Leg swelling?: No Chest pain?: No  Respiratory Cough?: No Shortness of breath?: No  Endocrine Excessive thirst?: No  Musculoskeletal Back pain?: Yes Joint pain?: No  Neurological Headaches?: No Dizziness?: No  Psychologic Depression?: No Anxiety?: No  Physical Exam: BP 136/87   Pulse 75   Ht 5\' 6"  (1.676 m)   Wt 130 lb (59 kg)   BMI 20.98 kg/m   Constitutional:  Well nourished. Alert and oriented, No acute distress. HEENT: [place] AT, moist mucus membranes.  Trachea midline. Cardiovascular: No clubbing, cyanosis, or edema. Respiratory: Normal respiratory effort, no increased work of breathing. Skin: No rashes, bruises or suspicious lesions. Neurologic: Grossly intact, no focal deficits, moving all 4 extremities. Psychiatric: Normal mood and affect.  Laboratory Data: Lab Results  Component Value Date   WBC 6.7 06/28/2018   HGB 12.1 (L) 06/28/2018   HCT 34.3 (L) 06/28/2018   MCV 86.0 06/28/2018   PLT 170 06/28/2018    Lab Results  Component Value Date   CREATININE 1.57 (H) 06/28/2018   Results for Alexander, Avery (MRN [id number]) as of 12/21/2018 17:00  Ref. Range 09/04/2018 14:30 12/19/2018 11:05  Prostate Specific Ag, Serum Latest Ref Range: 0.0 - 4.0 ng/mL 0.4 0.8   I have reviewed the labs.  Pertinent Imaging:  Results for orders placed or performed in visit on 01/31/19  Bladder Scan (Post Void Residual) in office  Result Value Ref Range   Scan Result 0    Assessment & Plan:    1. Prostate cancer - At this point, patient has elected to have CT and bone scans and to defer further treatment decisions for after he has results - scans scheduled for the 31st of March - I will call for results   2. LUTS - patient admitted  that he noticed some improvement when he took the Myrbetriq, but he did admit to not taking them consistently - script given for Myrbetriq 25 mg daily - RTC in 3 months for I PSS and PVR   Return in about 3 months (around 05/03/2019) for IPSS and PVR.  [name], PA-C   [facility] [address]  [place], [place] [address] [phone]  I, Adele Schilder, am acting as a Education administrator for [employer], PA-C.   I have reviewed the above documentation for accuracy and completeness, and I agree with the above.    [name], PA-C

## 2019-01-31 ENCOUNTER — Encounter: Payer: Self-pay | Admitting: Urology

## 2019-01-31 ENCOUNTER — Other Ambulatory Visit: Payer: Self-pay

## 2019-01-31 ENCOUNTER — Ambulatory Visit (INDEPENDENT_AMBULATORY_CARE_PROVIDER_SITE_OTHER): Payer: Medicare Other | Admitting: Urology

## 2019-01-31 VITALS — BP 136/87 | HR 75 | Ht 66.0 in | Wt 130.0 lb

## 2019-01-31 DIAGNOSIS — N35819 Other urethral stricture, male, unspecified site: Secondary | ICD-10-CM | POA: Diagnosis not present

## 2019-01-31 DIAGNOSIS — C61 Malignant neoplasm of prostate: Secondary | ICD-10-CM

## 2019-01-31 DIAGNOSIS — R9721 Rising PSA following treatment for malignant neoplasm of prostate: Secondary | ICD-10-CM

## 2019-01-31 DIAGNOSIS — R3915 Urgency of urination: Secondary | ICD-10-CM | POA: Diagnosis not present

## 2019-01-31 LAB — BLADDER SCAN AMB NON-IMAGING: SCAN RESULT: 0

## 2019-01-31 MED ORDER — MIRABEGRON ER 25 MG PO TB24: 25.0000 mg | ORAL_TABLET | Freq: Every day | ORAL | 4 refills | Status: DC

## 2019-01-31 NOTE — Patient Instructions (Signed)
Gastroenterologist

## 2019-02-13 ENCOUNTER — Inpatient Hospital Stay: Admission: RE | Admit: 2019-02-13 | Payer: Medicare Other | Source: Ambulatory Visit

## 2019-02-13 ENCOUNTER — Other Ambulatory Visit: Payer: Self-pay

## 2019-02-13 ENCOUNTER — Other Ambulatory Visit: Payer: Medicare Other

## 2019-02-13 ENCOUNTER — Ambulatory Visit
Admission: RE | Admit: 2019-02-13 | Discharge: 2019-02-13 | Disposition: A | Discharge status: Home / Self Care | Payer: Medicare Other | Source: Ambulatory Visit | Attending: Urology | Admitting: Urology

## 2019-02-13 ENCOUNTER — Telehealth: Payer: Self-pay | Admitting: Urology

## 2019-02-13 ENCOUNTER — Encounter
Admission: RE | Admit: 2019-02-13 | Discharge: 2019-02-13 | Disposition: A | Discharge status: Home / Self Care | Payer: Medicare Other | Source: Ambulatory Visit | Attending: Urology | Admitting: Urology

## 2019-02-13 DIAGNOSIS — C61 Malignant neoplasm of prostate: Secondary | ICD-10-CM | POA: Diagnosis present

## 2019-02-13 DIAGNOSIS — R9721 Rising PSA following treatment for malignant neoplasm of prostate: Secondary | ICD-10-CM

## 2019-02-13 LAB — POCT I-STAT CREATININE: Creatinine, Ser: 1.5 mg/dL — ABNORMAL HIGH (ref 0.61–1.24)

## 2019-02-13 MED ORDER — TECHNETIUM TC 99M MEDRONATE IV KIT
23.4700 | PACK | Freq: Once | INTRAVENOUS | Status: AC | PRN
  Administered 2019-02-13: 23.47 via INTRAVENOUS

## 2019-02-13 MED ORDER — IOHEXOL 300 MG/ML  SOLN
100.0000 mL | Freq: Once | INTRAMUSCULAR | Status: AC | PRN
  Administered 2019-02-13: 100 mL via INTRAVENOUS

## 2019-02-13 NOTE — Telephone Encounter (Signed)
Alexander Avery has had his imaging completed.  Would you schedule him a virtual visit so that I can review the results with him?

## 2019-02-20 ENCOUNTER — Telehealth (INDEPENDENT_AMBULATORY_CARE_PROVIDER_SITE_OTHER): Payer: Medicare Other | Admitting: Urology

## 2019-02-20 ENCOUNTER — Other Ambulatory Visit: Payer: Self-pay

## 2019-02-20 DIAGNOSIS — C61 Malignant neoplasm of prostate: Secondary | ICD-10-CM | POA: Insufficient documentation

## 2019-02-20 DIAGNOSIS — R9721 Rising PSA following treatment for malignant neoplasm of prostate: Secondary | ICD-10-CM | POA: Diagnosis not present

## 2019-02-20 NOTE — Progress Notes (Signed)
Virtual Visit via Video Note I connected with Alexander Avery on 02/20/19 at 1346 by a telephone as patient does not have a smart phone or access to the Internet video and verified that I am speaking with the correct person using two identifiers.   This visit type was conducted due to national recommendations for restrictions regarding the COVID-19 Pandemic (e.g. social distancing).  This format is felt to be most appropriate for this patient at this time.  All issues noted in this document were discussed and addressed.  No physical exam was performed.   I discussed the limitations, risks, security and privacy concerns of performing an evaluation and management service by telephone and the availability of in person appointments. I also discussed with the patient that there may be a patient responsible charge related to this service. The patient expressed understanding and agreed to proceed.   History of Present Illness: Alexander Avery is a 63 year old African-American male with a history of prostate cancer who is status post RRP who has been having a rise in his PSA.   His PSA was 0.4 in 08/2018 and then increased again to 0.8 in 12/2018.  He has been very reluctant with going forward for further evaluation and treatment for this biochemical recurrence.  He did finally agree to undergo a CT scan and bone scan to look for any metastatic disease.    I attempted to review his imaging studies with him, but he kept insisting that I just send him the information in writing.  I was able to convey my concern regarding his rising PSA and what that signifies after having his prostate removed.  He seemed to understand that this is something serious.     Observations/Objective: CLINICAL DATA:  Prostate cancer, rising PSA  EXAM: NUCLEAR MEDICINE WHOLE BODY BONE SCAN  TECHNIQUE: Whole body anterior and posterior images were obtained approximately 3 hours after intravenous injection of  radiopharmaceutical.  RADIOPHARMACEUTICALS:  23.47 mCi Technetium-7m MDP IV  COMPARISON:  None  Correlation: CT abdomen and pelvis 02/13/2019  FINDINGS: Uptake at LEFT great toe typically degenerative or posttraumatic.  No additional sites of abnormal osseous tracer accumulation are identified.  Expected urinary tract and soft tissue distribution of tracer.  IMPRESSION: No scintigraphic evidence of osseous metastatic disease.   Electronically Signed   By: Lavonia Dana M.D.   On: 02/13/2019 13:10  CLINICAL DATA:  63 year old male with history of prostate cancer diagnosed in 2016 treated with surgical resection. Follow-up study.  EXAM: CT ABDOMEN AND PELVIS WITH CONTRAST  TECHNIQUE: Multidetector CT imaging of the abdomen and pelvis was performed using the standard protocol following bolus administration of intravenous contrast.  CONTRAST:  18mL OMNIPAQUE IOHEXOL 300 MG/ML  SOLN  COMPARISON:  No priors.  FINDINGS: Lower chest: Unremarkable.  Hepatobiliary: No suspicious cystic or solid hepatic lesions. No intra or extrahepatic biliary ductal dilatation. Gallbladder is normal in appearance.  Pancreas: No pancreatic mass. No pancreatic ductal dilatation. No pancreatic or peripancreatic fluid or inflammatory changes.  Spleen: Unremarkable.  Adrenals/Urinary Tract: Bilateral kidneys and bilateral adrenal glands are normal in appearance. No hydroureteronephrosis. Urinary bladder is normal in appearance.  Stomach/Bowel: Normal appearance of the stomach. No pathologic dilatation of small bowel or colon. The appendix is not confidently identified and may be surgically absent. Regardless, there are no inflammatory changes noted adjacent to the cecum to suggest the presence of an acute appendicitis at this time.  Vascular/Lymphatic: Aortic atherosclerosis, without evidence of aneurysm or dissection in the abdominal  or pelvic vasculature.  No lymphadenopathy noted in the abdomen or pelvis.  Reproductive: Status post radical prostatectomy.  Other: No significant volume of ascites.  No pneumoperitoneum.  Musculoskeletal: There are no aggressive appearing lytic or blastic lesions noted in the visualized portions of the skeleton.  IMPRESSION: 1. No findings to suggest metastatic disease to the abdomen or pelvis. 2. Aortic atherosclerosis. 3. Additional incidental findings, as above.   Electronically Signed   By: Vinnie Langton M.D.   On: 02/13/2019 10:39   Assessment and Plan:  1. Prostate cancer S/P RRP in 2016 in [place] will detectable PSA levels signalling biochemical recurrence - no evidence of metastatic disease on CT or bone scan  Discussed with patient NCCN guidelines of salvage radiation/ADT - after explaining each therapy and side effects, he is not ready to commit to any course of treatment at this time, but he did agree to a referral to Dr. Donella Stade.   Patient would like a letter explaining his situation and copies of the report which I will provide as he states he cannot comprehend everything I am saying and would like to read it instead   Follow Up Instructions:  Refer to Dr. Donella Stade for possible salvage radiation  I discussed the assessment and treatment plan with the patient. The patient was provided an opportunity to ask questions and all were answered. The patient agreed with the plan and demonstrated an understanding of the instructions.   The patient was advised to call back or seek an in-person evaluation if the symptoms worsen or if the condition fails to improve as anticipated.  I provided 7 minutes of non-face-to-face time during this encounter.   Alexander Edgell, PA-C

## 2019-02-27 ENCOUNTER — Other Ambulatory Visit: Payer: Self-pay

## 2019-02-28 ENCOUNTER — Other Ambulatory Visit: Payer: Self-pay

## 2019-02-28 ENCOUNTER — Ambulatory Visit
Admission: RE | Admit: 2019-02-28 | Discharge: 2019-02-28 | Disposition: A | Discharge status: Home / Self Care | Payer: Medicare Other | Source: Ambulatory Visit | Attending: Radiation Oncology | Admitting: Radiation Oncology

## 2019-02-28 ENCOUNTER — Encounter: Payer: Self-pay | Admitting: Radiation Oncology

## 2019-02-28 DIAGNOSIS — F419 Anxiety disorder, unspecified: Secondary | ICD-10-CM | POA: Insufficient documentation

## 2019-02-28 DIAGNOSIS — M129 Arthropathy, unspecified: Secondary | ICD-10-CM | POA: Insufficient documentation

## 2019-02-28 DIAGNOSIS — R351 Nocturia: Secondary | ICD-10-CM | POA: Diagnosis not present

## 2019-02-28 DIAGNOSIS — C61 Malignant neoplasm of prostate: Secondary | ICD-10-CM

## 2019-02-28 DIAGNOSIS — K219 Gastro-esophageal reflux disease without esophagitis: Secondary | ICD-10-CM | POA: Insufficient documentation

## 2019-02-28 DIAGNOSIS — Z8711 Personal history of peptic ulcer disease: Secondary | ICD-10-CM | POA: Diagnosis not present

## 2019-02-28 DIAGNOSIS — E119 Type 2 diabetes mellitus without complications: Secondary | ICD-10-CM | POA: Diagnosis not present

## 2019-02-28 DIAGNOSIS — N529 Male erectile dysfunction, unspecified: Secondary | ICD-10-CM | POA: Insufficient documentation

## 2019-02-28 DIAGNOSIS — G40909 Epilepsy, unspecified, not intractable, without status epilepticus: Secondary | ICD-10-CM | POA: Insufficient documentation

## 2019-02-28 DIAGNOSIS — I10 Essential (primary) hypertension: Secondary | ICD-10-CM | POA: Diagnosis not present

## 2019-02-28 DIAGNOSIS — G4730 Sleep apnea, unspecified: Secondary | ICD-10-CM | POA: Insufficient documentation

## 2019-02-28 NOTE — Consult Note (Signed)
NEW PATIENT EVALUATION  Name: Alexander Avery  MRN: [id number]  Date:   02/28/2019     DOB: 1956-08-03   This 63 y.o. 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  REFERRING PHYSICIAN: Nori Riis, PA-C  CHIEF COMPLAINT:  Chief Complaint  Patient presents with  . Prostate Cancer    initial consultation    DIAGNOSIS: The encounter diagnosis was Prostate cancer ([place]).   PREVIOUS INVESTIGATIONS:  CT scans and bone scan reviewed Pathology report reviewed Clinical notes reviewed  HPI: Patient is a 63 year old male presented with an elevated PSA around the 16 level back around 2015.  He subsequently underwent a robotic assisted radical prostatectomy.  25% of the gland was involved with mostly Gleason 7 (3+4) adenocarcinoma.  There was extra prostatic extension focally at the left base but surgical margins were negative.  No evidence of invasion of the seminal vesicles.  He has done well postoperatively.  He has been followed locally by Dr. Cherrie Gauze office for increased urgency and frequency since 2019.  Since his surgeries had 3 dilatations for stricture.  Dr. Bernardo Heater performed a cystoscopy in November 2019 with no evidence of urethral stricture or bladder neck contracture.  He does have erectile dysfunction since his surgery.  His most recent PSA 2 months prior was 0.8 almost a doubling since 5 months prior.  He is reluctantly seeing opinion today regarding salvage radiation therapy.  Again he has problems with rectal function complains of lots of straining on defecation.  Also continues to have frequency urgency and nocturia.  Part of the patient's work-up he is recently had a bone scan as well as CT scan of abdomen pelvis showing no evidence of macroscopic metastatic disease or recurrence in his  prostatic fossa.  PLANNED TREATMENT REGIMEN: Salvage radiation therapy  PAST MEDICAL HISTORY:  has a past medical history of Anxiety, Arthritis, Cancer ([place]), Cataract (2019), Diabetes mellitus without complication ([place]), GERD (gastroesophageal reflux disease), Hernia of abdominal wall (2015), History of stomach ulcers, Hypertension, Prostate cancer ([place]), Seizures ([place]), and Sleep apnea.    PAST SURGICAL HISTORY:  Past Surgical History:  Procedure Laterality Date  . CATARACT EXTRACTION W/PHACO Right 03/30/2018   Procedure: CATARACT EXTRACTION PHACO AND INTRAOCULAR LENS PLACEMENT (IOC);  Surgeon: Eulogio Bear, MD;  Location: [facility] ORS;  Service: Ophthalmology;  Laterality: Right;  [place] 01:09.5 AP% 10.4 CDE 7.98 Fluid pack lot # [id number]  . CATARACT EXTRACTION W/PHACO Left 06/29/2018   Procedure: CATARACT EXTRACTION PHACO AND INTRAOCULAR LENS PLACEMENT (IOC);  Surgeon: Eulogio Bear, MD;  Location: [facility] ORS;  Service: Ophthalmology;  Laterality: Left;  [place] 01:27.7 AP% 8.2 CDE 7.27 Fluid pack lot # [id number]  . EYE SURGERY    . HERNIA REPAIR    . PROSTATE SURGERY    . PROSTATE SURGERY  2016    FAMILY HISTORY: family history includes Cancer in his mother.  SOCIAL HISTORY:  reports that he has been smoking. He has been smoking about 2.00 packs per day. He has never used smokeless tobacco. He reports current alcohol use of about 2.0 standard drinks of alcohol per week. He reports that he does not use drugs.  ALLERGIES: Penicillins  MEDICATIONS:  Current Outpatient Medications  Medication Sig Dispense Refill  . acetaminophen (TYLENOL) 325 MG tablet Take 650 mg by mouth every 6 (six) hours  as needed for moderate pain or headache.    Marland Kitchen atorvastatin (LIPITOR) 40 MG tablet Take 40 mg by mouth at bedtime.  4  . canagliflozin (INVOKANA) 300 MG TABS tablet Take 300 mg by mouth daily before breakfast.    . Difluprednate (DUREZOL) 0.05 % EMUL Place 1 drop into the right eye 2 (two) times  daily.     Marland Kitchen JARDIANCE 10 MG TABS tablet TAKE 1 TABLET BY MOUTH IN THE MORNING FOR DIABETES    . losartan-hydrochlorothiazide (HYZAAR) 100-25 MG tablet Take 1 tablet by mouth daily.    . meclizine (ANTIVERT) 12.5 MG tablet Take 1 tablet (12.5 mg total) by mouth 2 (two) times daily as needed for dizziness. 10 tablet 0  . metFORMIN (GLUCOPHAGE) 500 MG tablet Take 500 mg by mouth 2 (two) times daily with a meal.    . mirabegron ER (MYRBETRIQ) 25 MG TB24 tablet Take 1 tablet (25 mg total) by mouth daily. 28 tablet 0  . mirabegron ER (MYRBETRIQ) 25 MG TB24 tablet Take 1 tablet (25 mg total) by mouth daily. 30 tablet 4  . sildenafil (REVATIO) 20 MG tablet Take 3 to 5 tablets two hours before intercouse on an empty stomach.  Do not take with nitrates. 50 tablet 3   No current facility-administered medications for this encounter.     ECOG PERFORMANCE STATUS:  1 - Symptomatic but completely ambulatory  REVIEW OF SYSTEMS: Except for the urinary problems Patient denies any weight loss, fatigue, weakness, fever, chills or night sweats. Patient denies any loss of vision, blurred vision. Patient denies any ringing  of the ears or hearing loss. No irregular heartbeat. Patient denies heart murmur or history of fainting. Patient denies any chest pain or pain radiating to her upper extremities. Patient denies any shortness of breath, difficulty breathing at night, cough or hemoptysis. Patient denies any swelling in the lower legs. Patient denies any nausea vomiting, vomiting of blood, or coffee ground material in the vomitus. Patient denies any stomach pain. Patient states has had normal bowel movements no significant constipation or diarrhea. Patient denies any dysuria, hematuria or significant nocturia. Patient denies any problems walking, swelling in the joints or loss of balance. Patient denies any skin changes, loss of hair or loss of weight. Patient denies any excessive worrying or anxiety or significant  depression. Patient denies any problems with insomnia. Patient denies excessive thirst, polyuria, polydipsia. Patient denies any swollen glands, patient denies easy bruising or easy bleeding. Patient denies any recent infections, allergies or URI. Patient "s visual fields have not changed significantly in recent time.   PHYSICAL EXAM: There were no vitals taken for this visit. Well-developed well-nourished patient in NAD. HEENT reveals PERLA, EOMI, discs not visualized.  Oral cavity is clear. No oral mucosal lesions are identified. Neck is clear without evidence of cervical or supraclavicular adenopathy. Lungs are clear to A&P. Cardiac examination is essentially unremarkable with regular rate and rhythm without murmur rub or thrill. Abdomen is benign with no organomegaly or masses noted. Motor sensory and DTR levels are equal and symmetric in the upper and lower extremities. Cranial nerves II through XII are grossly intact. Proprioception is intact. No peripheral adenopathy or edema is identified. No motor or sensory levels are noted. Crude visual fields are within normal range.  LABORATORY DATA: Pathology reports from his robotic prostatectomy reviewed and compatible with above-stated findings    RADIOLOGY RESULTS: CT scan and bone scan are reviewed showing no evidence of local or metastatic disease  IMPRESSION: Biochemical recurrence of Gleason 7 (3+4) adenocarcinoma prostate status post robotic assisted prostatectomy in 5140 in 63 year old male  PLAN: At this time of run the [facility] nomogram showing a low incidence of pelvic lymph node involvement of his disease.  There was a 59% chance of extracapsular extension.  I have recommended salvage radiation therapy to his prostatic fossa.  Would treat that area up to 7600 cGy over approximately 8 weeks using IMRT treatment planning and delivery.  Risks and benefits of treatment including skin reaction fatigue alteration of blood counts  increased lower urinary tract symptoms possible diarrhea all associated with radiation therapy.  I would also like to suppress his testosterone with Lupron for approximately 6 months.  Patient is reluctant on treatment although I have personally set up and arranged for CT simulation in the beginning of May which the patient prefers.  I have also discussed the distinct possibility with untreated adenocarcinoma the prostate he eventually will develop bone metastasis which is not curable.  Patient comprehends my treatment plan well.  I would like to take this opportunity to thank you for allowing me to participate in the care of your patient.Noreene Filbert, MD

## 2019-03-08 NOTE — Telephone Encounter (Signed)
error 

## 2019-03-27 ENCOUNTER — Other Ambulatory Visit: Payer: Self-pay

## 2019-03-28 ENCOUNTER — Other Ambulatory Visit: Payer: Self-pay

## 2019-03-28 ENCOUNTER — Ambulatory Visit
Admission: RE | Admit: 2019-03-28 | Discharge: 2019-03-28 | Disposition: A | Discharge status: Home / Self Care | Payer: Medicare Other | Source: Ambulatory Visit | Attending: Radiation Oncology | Admitting: Radiation Oncology

## 2019-03-28 DIAGNOSIS — Z510 Encounter for antineoplastic radiation therapy: Secondary | ICD-10-CM | POA: Insufficient documentation

## 2019-03-28 DIAGNOSIS — C61 Malignant neoplasm of prostate: Secondary | ICD-10-CM | POA: Diagnosis not present

## 2019-03-29 DIAGNOSIS — Z510 Encounter for antineoplastic radiation therapy: Secondary | ICD-10-CM | POA: Diagnosis not present

## 2019-04-13 ENCOUNTER — Other Ambulatory Visit: Payer: Self-pay | Admitting: *Deleted

## 2019-04-13 DIAGNOSIS — C61 Malignant neoplasm of prostate: Secondary | ICD-10-CM

## 2019-04-17 ENCOUNTER — Other Ambulatory Visit: Payer: Self-pay

## 2019-04-18 ENCOUNTER — Ambulatory Visit
Admission: RE | Admit: 2019-04-18 | Discharge: 2019-04-18 | Disposition: A | Discharge status: Home / Self Care | Payer: Medicare Other | Source: Ambulatory Visit | Attending: Radiation Oncology | Admitting: Radiation Oncology

## 2019-04-18 ENCOUNTER — Other Ambulatory Visit: Payer: Self-pay

## 2019-04-18 DIAGNOSIS — Z510 Encounter for antineoplastic radiation therapy: Secondary | ICD-10-CM | POA: Insufficient documentation

## 2019-04-18 DIAGNOSIS — C61 Malignant neoplasm of prostate: Secondary | ICD-10-CM | POA: Insufficient documentation

## 2019-04-19 ENCOUNTER — Other Ambulatory Visit: Payer: Self-pay

## 2019-04-19 ENCOUNTER — Ambulatory Visit
Admission: RE | Admit: 2019-04-19 | Discharge: 2019-04-19 | Disposition: A | Discharge status: Home / Self Care | Payer: Medicare Other | Source: Ambulatory Visit | Attending: Radiation Oncology | Admitting: Radiation Oncology

## 2019-04-19 DIAGNOSIS — C61 Malignant neoplasm of prostate: Secondary | ICD-10-CM | POA: Diagnosis not present

## 2019-04-19 DIAGNOSIS — Z510 Encounter for antineoplastic radiation therapy: Secondary | ICD-10-CM | POA: Diagnosis not present

## 2019-04-20 ENCOUNTER — Ambulatory Visit
Admission: RE | Admit: 2019-04-20 | Discharge: 2019-04-20 | Disposition: A | Discharge status: Home / Self Care | Payer: Medicare Other | Source: Ambulatory Visit | Attending: Radiation Oncology | Admitting: Radiation Oncology

## 2019-04-20 ENCOUNTER — Other Ambulatory Visit: Payer: Self-pay

## 2019-04-20 DIAGNOSIS — Z510 Encounter for antineoplastic radiation therapy: Secondary | ICD-10-CM | POA: Diagnosis not present

## 2019-04-23 ENCOUNTER — Other Ambulatory Visit: Payer: Self-pay

## 2019-04-23 ENCOUNTER — Ambulatory Visit
Admission: RE | Admit: 2019-04-23 | Discharge: 2019-04-23 | Disposition: A | Discharge status: Home / Self Care | Payer: Medicare Other | Source: Ambulatory Visit | Attending: Radiation Oncology | Admitting: Radiation Oncology

## 2019-04-23 DIAGNOSIS — Z510 Encounter for antineoplastic radiation therapy: Secondary | ICD-10-CM | POA: Diagnosis not present

## 2019-04-24 ENCOUNTER — Other Ambulatory Visit: Payer: Self-pay

## 2019-04-24 ENCOUNTER — Ambulatory Visit
Admission: RE | Admit: 2019-04-24 | Discharge: 2019-04-24 | Disposition: A | Discharge status: Home / Self Care | Payer: Medicare Other | Source: Ambulatory Visit | Attending: Radiation Oncology | Admitting: Radiation Oncology

## 2019-04-24 DIAGNOSIS — Z510 Encounter for antineoplastic radiation therapy: Secondary | ICD-10-CM | POA: Diagnosis not present

## 2019-04-25 ENCOUNTER — Ambulatory Visit
Admission: RE | Admit: 2019-04-25 | Discharge: 2019-04-25 | Disposition: A | Discharge status: Home / Self Care | Payer: Medicare Other | Source: Ambulatory Visit | Attending: Radiation Oncology | Admitting: Radiation Oncology

## 2019-04-25 ENCOUNTER — Other Ambulatory Visit: Payer: Self-pay

## 2019-04-25 DIAGNOSIS — Z510 Encounter for antineoplastic radiation therapy: Secondary | ICD-10-CM | POA: Diagnosis not present

## 2019-04-26 ENCOUNTER — Ambulatory Visit
Admission: RE | Admit: 2019-04-26 | Discharge: 2019-04-26 | Disposition: A | Discharge status: Home / Self Care | Payer: Medicare Other | Source: Ambulatory Visit | Attending: Radiation Oncology | Admitting: Radiation Oncology

## 2019-04-26 ENCOUNTER — Other Ambulatory Visit: Payer: Self-pay

## 2019-04-26 ENCOUNTER — Other Ambulatory Visit: Payer: Self-pay | Admitting: *Deleted

## 2019-04-26 DIAGNOSIS — Z510 Encounter for antineoplastic radiation therapy: Secondary | ICD-10-CM | POA: Diagnosis not present

## 2019-04-26 MED ORDER — HYDROCORTISONE 2.5 % EX CREA: TOPICAL_CREAM | Freq: Two times a day (BID) | CUTANEOUS | 1 refills | Status: DC

## 2019-04-27 ENCOUNTER — Ambulatory Visit
Admission: RE | Admit: 2019-04-27 | Discharge: 2019-04-27 | Disposition: A | Discharge status: Home / Self Care | Payer: Medicare Other | Source: Ambulatory Visit | Attending: Radiation Oncology | Admitting: Radiation Oncology

## 2019-04-27 ENCOUNTER — Other Ambulatory Visit: Payer: Self-pay

## 2019-04-27 DIAGNOSIS — Z510 Encounter for antineoplastic radiation therapy: Secondary | ICD-10-CM | POA: Diagnosis not present

## 2019-04-30 ENCOUNTER — Ambulatory Visit
Admission: RE | Admit: 2019-04-30 | Discharge: 2019-04-30 | Disposition: A | Discharge status: Home / Self Care | Payer: Medicare Other | Source: Ambulatory Visit | Attending: Radiation Oncology | Admitting: Radiation Oncology

## 2019-04-30 ENCOUNTER — Other Ambulatory Visit: Payer: Self-pay

## 2019-04-30 DIAGNOSIS — Z510 Encounter for antineoplastic radiation therapy: Secondary | ICD-10-CM | POA: Diagnosis not present

## 2019-05-01 ENCOUNTER — Ambulatory Visit
Admission: RE | Admit: 2019-05-01 | Discharge: 2019-05-01 | Disposition: A | Discharge status: Home / Self Care | Payer: Medicare Other | Source: Ambulatory Visit | Attending: Radiation Oncology | Admitting: Radiation Oncology

## 2019-05-01 ENCOUNTER — Other Ambulatory Visit: Payer: Self-pay

## 2019-05-01 DIAGNOSIS — Z510 Encounter for antineoplastic radiation therapy: Secondary | ICD-10-CM | POA: Diagnosis not present

## 2019-05-02 ENCOUNTER — Other Ambulatory Visit: Payer: Self-pay

## 2019-05-02 ENCOUNTER — Ambulatory Visit
Admission: RE | Admit: 2019-05-02 | Discharge: 2019-05-02 | Disposition: A | Discharge status: Home / Self Care | Payer: Medicare Other | Source: Ambulatory Visit | Attending: Radiation Oncology | Admitting: Radiation Oncology

## 2019-05-02 ENCOUNTER — Inpatient Hospital Stay: Payer: Medicare Other | Attending: Radiation Oncology

## 2019-05-02 DIAGNOSIS — Z510 Encounter for antineoplastic radiation therapy: Secondary | ICD-10-CM | POA: Diagnosis not present

## 2019-05-02 DIAGNOSIS — C61 Malignant neoplasm of prostate: Secondary | ICD-10-CM | POA: Insufficient documentation

## 2019-05-02 LAB — CBC
HCT: 36.5 % — ABNORMAL LOW (ref 39.0–52.0)
Hemoglobin: 13.4 g/dL (ref 13.0–17.0)
MCH: 30.5 pg (ref 26.0–34.0)
MCHC: 36.7 g/dL — ABNORMAL HIGH (ref 30.0–36.0)
MCV: 83.1 fL (ref 80.0–100.0)
Platelets: 192 10*3/uL (ref 150–400)
RBC: 4.39 MIL/uL (ref 4.22–5.81)
RDW: 13.4 % (ref 11.5–15.5)
WBC: 5.8 10*3/uL (ref 4.0–10.5)
nRBC: 0 % (ref 0.0–0.2)

## 2019-05-03 ENCOUNTER — Ambulatory Visit
Admission: RE | Admit: 2019-05-03 | Discharge: 2019-05-03 | Disposition: A | Discharge status: Home / Self Care | Payer: Medicare Other | Source: Ambulatory Visit | Attending: Radiation Oncology | Admitting: Radiation Oncology

## 2019-05-03 ENCOUNTER — Telehealth: Payer: Self-pay | Admitting: Urology

## 2019-05-03 ENCOUNTER — Telehealth (INDEPENDENT_AMBULATORY_CARE_PROVIDER_SITE_OTHER): Payer: Medicare Other | Admitting: Urology

## 2019-05-03 ENCOUNTER — Other Ambulatory Visit: Payer: Self-pay

## 2019-05-03 DIAGNOSIS — Z510 Encounter for antineoplastic radiation therapy: Secondary | ICD-10-CM | POA: Diagnosis not present

## 2019-05-03 DIAGNOSIS — R9721 Rising PSA following treatment for malignant neoplasm of prostate: Secondary | ICD-10-CM

## 2019-05-03 NOTE — Progress Notes (Signed)
Virtual Visit via Telephone Note  I connected with Alexander Avery on 05/03/2019 at 1000 by telephone and verified that I am speaking with the correct person using two identifiers.  They are located at home.  I am located at my home.    This visit type was conducted due to national recommendations for restrictions regarding the COVID-19 Pandemic (e.g. social distancing).  This format is felt to be most appropriate for this patient at this time.  All issues noted in this document were discussed and addressed.  No physical exam was performed.   I discussed the limitations, risks, security and privacy concerns of performing an evaluation and management service by telephone and the availability of in person appointments. I also discussed with the patient that there may be a patient responsible charge related to this service. The patient expressed understanding and agreed to proceed.   History of Present Illness: Alexander Avery is a 63 year old male with biochemical recurrence of Gleason 7 (3 + 4) adenocarcinoma prostate cancer status post robotic assisted prostatectomy in 2016 who is undergoing salvage IMRT who is contacted by telephone to discuss ADT.    His final IMRT is scheduled for 06/12/2019.    He has not complaints at this time.  Patient denies any gross hematuria, dysuria or suprapubic/flank pain.  Patient denies any fevers, chills, nausea or vomiting.    Observations/Objective: Alexander Avery does not sound distressed and is answering questions appropriately.    Assessment and Plan:  1. Prostate cancer We did discuss the risks of ADT therapy, such as: weight gain, ED, decrease in libido, fatigue, hot flashes, back pain, joint pain, chills, constipation, heart disease, itching where the injection is given and pain where the injection is given.  We also discussed bone health and I have advised the patient to start Calcium 600 gm twice daily and Vitamin D 200 mg twice daily.  We will need to  give him an AVS with this information as well.    Follow Up Instructions:  We will obtain prior approval with his insurance company and have him scheduled for a 6 months Lupon injection.     I discussed the assessment and treatment plan with the patient. The patient was provided an opportunity to ask questions and all were answered. The patient agreed with the plan and demonstrated an understanding of the instructions.   The patient was advised to call back or seek an in-person evaluation if the symptoms worsen or if the condition fails to improve as anticipated.  I provided 12 minutes of non-face-to-face time during this encounter.   Dwight Adamczak, PA-C

## 2019-05-03 NOTE — Telephone Encounter (Signed)
Dr. Donella Stade would like Alexander Avery to have 6 months of Lupron.  Would you get the PA on this and get him scheduled. He is already receiving radiation, so we need to get this done soon.

## 2019-05-04 ENCOUNTER — Ambulatory Visit
Admission: RE | Admit: 2019-05-04 | Discharge: 2019-05-04 | Disposition: A | Discharge status: Home / Self Care | Payer: Medicare Other | Source: Ambulatory Visit | Attending: Radiation Oncology | Admitting: Radiation Oncology

## 2019-05-04 ENCOUNTER — Other Ambulatory Visit: Payer: Self-pay

## 2019-05-04 DIAGNOSIS — Z510 Encounter for antineoplastic radiation therapy: Secondary | ICD-10-CM | POA: Diagnosis not present

## 2019-05-04 NOTE — Telephone Encounter (Signed)
YES I will take care of this  Alexander Avery

## 2019-05-07 ENCOUNTER — Ambulatory Visit
Admission: RE | Admit: 2019-05-07 | Discharge: 2019-05-07 | Disposition: A | Discharge status: Home / Self Care | Payer: Medicare Other | Source: Ambulatory Visit | Attending: Radiation Oncology | Admitting: Radiation Oncology

## 2019-05-07 ENCOUNTER — Other Ambulatory Visit: Payer: Self-pay

## 2019-05-07 DIAGNOSIS — Z510 Encounter for antineoplastic radiation therapy: Secondary | ICD-10-CM | POA: Diagnosis not present

## 2019-05-08 ENCOUNTER — Ambulatory Visit
Admission: RE | Admit: 2019-05-08 | Discharge: 2019-05-08 | Disposition: A | Discharge status: Home / Self Care | Payer: Medicare Other | Source: Ambulatory Visit | Attending: Radiation Oncology | Admitting: Radiation Oncology

## 2019-05-08 ENCOUNTER — Other Ambulatory Visit: Payer: Self-pay

## 2019-05-08 DIAGNOSIS — Z510 Encounter for antineoplastic radiation therapy: Secondary | ICD-10-CM | POA: Diagnosis not present

## 2019-05-09 ENCOUNTER — Ambulatory Visit
Admission: RE | Admit: 2019-05-09 | Discharge: 2019-05-09 | Disposition: A | Discharge status: Home / Self Care | Payer: Medicare Other | Source: Ambulatory Visit | Attending: Radiation Oncology | Admitting: Radiation Oncology

## 2019-05-09 ENCOUNTER — Other Ambulatory Visit: Payer: Self-pay

## 2019-05-09 DIAGNOSIS — Z510 Encounter for antineoplastic radiation therapy: Secondary | ICD-10-CM | POA: Diagnosis not present

## 2019-05-10 ENCOUNTER — Other Ambulatory Visit: Payer: Self-pay

## 2019-05-10 ENCOUNTER — Ambulatory Visit
Admission: RE | Admit: 2019-05-10 | Discharge: 2019-05-10 | Disposition: A | Discharge status: Home / Self Care | Payer: Medicare Other | Source: Ambulatory Visit | Attending: Radiation Oncology | Admitting: Radiation Oncology

## 2019-05-10 DIAGNOSIS — Z510 Encounter for antineoplastic radiation therapy: Secondary | ICD-10-CM | POA: Diagnosis not present

## 2019-05-11 ENCOUNTER — Ambulatory Visit
Admission: RE | Admit: 2019-05-11 | Discharge: 2019-05-11 | Disposition: A | Discharge status: Home / Self Care | Payer: Medicare Other | Source: Ambulatory Visit | Attending: Radiation Oncology | Admitting: Radiation Oncology

## 2019-05-11 ENCOUNTER — Other Ambulatory Visit: Payer: Self-pay

## 2019-05-11 DIAGNOSIS — Z510 Encounter for antineoplastic radiation therapy: Secondary | ICD-10-CM | POA: Diagnosis not present

## 2019-05-11 NOTE — Telephone Encounter (Signed)
I had to double book you for 10:00 on Monday to get him in right after his treatment on Monday. I asked them to walk him over when he is done since he has to see you for his 1st injection.   PA for Lupron  [id number] 05-08-19 through 05-07-2020 1st injection 05-14-19  [facility]

## 2019-05-14 ENCOUNTER — Ambulatory Visit (INDEPENDENT_AMBULATORY_CARE_PROVIDER_SITE_OTHER): Payer: Medicare Other | Admitting: *Deleted

## 2019-05-14 ENCOUNTER — Ambulatory Visit
Admission: RE | Admit: 2019-05-14 | Discharge: 2019-05-14 | Disposition: A | Discharge status: Home / Self Care | Payer: Medicare Other | Source: Ambulatory Visit | Attending: Radiation Oncology | Admitting: Radiation Oncology

## 2019-05-14 ENCOUNTER — Other Ambulatory Visit: Payer: Self-pay

## 2019-05-14 DIAGNOSIS — Z510 Encounter for antineoplastic radiation therapy: Secondary | ICD-10-CM | POA: Diagnosis not present

## 2019-05-14 DIAGNOSIS — C61 Malignant neoplasm of prostate: Secondary | ICD-10-CM

## 2019-05-14 MED ORDER — LEUPROLIDE ACETATE (6 MONTH) 45 MG IM KIT
45.0000 mg | PACK | Freq: Once | INTRAMUSCULAR | Status: AC
  Administered 2019-05-14: 45 mg via INTRAMUSCULAR

## 2019-05-14 NOTE — Patient Instructions (Addendum)
Take Vitamin D and Calcium daily 

## 2019-05-14 NOTE — Progress Notes (Signed)
Lupron IM Injection   Due to Prostate Cancer patient is present today for a Lupron Injection.  Medication: Lupron 6 month Dose: 45mg   Location: right upper outer buttocks Lot: [id number] Exp: [id number]  Patient tolerated well, no complications were noted  Performed by: Rebeca Morris,CMA  Follow up: 6 months with PSA prior

## 2019-05-15 ENCOUNTER — Ambulatory Visit
Admission: RE | Admit: 2019-05-15 | Discharge: 2019-05-15 | Disposition: A | Discharge status: Home / Self Care | Payer: Medicare Other | Source: Ambulatory Visit | Attending: Radiation Oncology | Admitting: Radiation Oncology

## 2019-05-15 ENCOUNTER — Other Ambulatory Visit: Payer: Self-pay

## 2019-05-15 DIAGNOSIS — Z510 Encounter for antineoplastic radiation therapy: Secondary | ICD-10-CM | POA: Diagnosis not present

## 2019-05-16 ENCOUNTER — Other Ambulatory Visit: Payer: Self-pay

## 2019-05-16 ENCOUNTER — Telehealth: Payer: Self-pay | Admitting: Urology

## 2019-05-16 ENCOUNTER — Ambulatory Visit
Admission: RE | Admit: 2019-05-16 | Discharge: 2019-05-16 | Disposition: A | Discharge status: Home / Self Care | Payer: Medicare Other | Source: Ambulatory Visit | Attending: Radiation Oncology | Admitting: Radiation Oncology

## 2019-05-16 ENCOUNTER — Inpatient Hospital Stay: Payer: Medicare Other | Attending: Radiation Oncology

## 2019-05-16 DIAGNOSIS — C61 Malignant neoplasm of prostate: Secondary | ICD-10-CM | POA: Insufficient documentation

## 2019-05-16 DIAGNOSIS — Z510 Encounter for antineoplastic radiation therapy: Secondary | ICD-10-CM | POA: Insufficient documentation

## 2019-05-16 LAB — CBC
HCT: 39.7 % (ref 39.0–52.0)
Hemoglobin: 14.5 g/dL (ref 13.0–17.0)
MCH: 30.5 pg (ref 26.0–34.0)
MCHC: 36.5 g/dL — ABNORMAL HIGH (ref 30.0–36.0)
MCV: 83.6 fL (ref 80.0–100.0)
Platelets: 178 10*3/uL (ref 150–400)
RBC: 4.75 MIL/uL (ref 4.22–5.81)
RDW: 13.5 % (ref 11.5–15.5)
WBC: 6.4 10*3/uL (ref 4.0–10.5)
nRBC: 0 % (ref 0.0–0.2)

## 2019-05-16 NOTE — Telephone Encounter (Signed)
Pt called asking if he can have a Rx sent to [facility] [address] - he wasn't sure of the name of the medication, said it was samples given to him in a yellow box, Please advise pt at [phone]

## 2019-05-17 ENCOUNTER — Ambulatory Visit
Admission: RE | Admit: 2019-05-17 | Discharge: 2019-05-17 | Disposition: A | Discharge status: Home / Self Care | Payer: Medicare Other | Source: Ambulatory Visit | Attending: Radiation Oncology | Admitting: Radiation Oncology

## 2019-05-17 ENCOUNTER — Other Ambulatory Visit: Payer: Self-pay

## 2019-05-17 DIAGNOSIS — Z510 Encounter for antineoplastic radiation therapy: Secondary | ICD-10-CM | POA: Diagnosis not present

## 2019-05-17 NOTE — Telephone Encounter (Signed)
Called pt informed him that RX was sent in back in March. Advised pt to call pharmacy. Pt gave verbal understanding.

## 2019-05-21 ENCOUNTER — Ambulatory Visit
Admission: RE | Admit: 2019-05-21 | Discharge: 2019-05-21 | Disposition: A | Discharge status: Home / Self Care | Payer: Medicare Other | Source: Ambulatory Visit | Attending: Radiation Oncology | Admitting: Radiation Oncology

## 2019-05-21 ENCOUNTER — Other Ambulatory Visit: Payer: Self-pay

## 2019-05-21 ENCOUNTER — Other Ambulatory Visit: Payer: Self-pay | Admitting: Family Medicine

## 2019-05-21 DIAGNOSIS — Z510 Encounter for antineoplastic radiation therapy: Secondary | ICD-10-CM | POA: Diagnosis not present

## 2019-05-21 MED ORDER — MIRABEGRON ER 25 MG PO TB24: 25.0000 mg | ORAL_TABLET | Freq: Every day | ORAL | 4 refills | Status: DC

## 2019-05-22 ENCOUNTER — Ambulatory Visit
Admission: RE | Admit: 2019-05-22 | Discharge: 2019-05-22 | Disposition: A | Discharge status: Home / Self Care | Payer: Medicare Other | Source: Ambulatory Visit | Attending: Radiation Oncology | Admitting: Radiation Oncology

## 2019-05-22 ENCOUNTER — Other Ambulatory Visit: Payer: Self-pay

## 2019-05-22 DIAGNOSIS — Z510 Encounter for antineoplastic radiation therapy: Secondary | ICD-10-CM | POA: Diagnosis not present

## 2019-05-23 ENCOUNTER — Ambulatory Visit
Admission: RE | Admit: 2019-05-23 | Discharge: 2019-05-23 | Disposition: A | Discharge status: Home / Self Care | Payer: Medicare Other | Source: Ambulatory Visit | Attending: Radiation Oncology | Admitting: Radiation Oncology

## 2019-05-23 ENCOUNTER — Other Ambulatory Visit: Payer: Self-pay

## 2019-05-23 DIAGNOSIS — Z510 Encounter for antineoplastic radiation therapy: Secondary | ICD-10-CM | POA: Diagnosis not present

## 2019-05-24 ENCOUNTER — Other Ambulatory Visit: Payer: Self-pay

## 2019-05-24 ENCOUNTER — Ambulatory Visit
Admission: RE | Admit: 2019-05-24 | Discharge: 2019-05-24 | Disposition: A | Discharge status: Home / Self Care | Payer: Medicare Other | Source: Ambulatory Visit | Attending: Radiation Oncology | Admitting: Radiation Oncology

## 2019-05-24 DIAGNOSIS — Z510 Encounter for antineoplastic radiation therapy: Secondary | ICD-10-CM | POA: Diagnosis not present

## 2019-05-25 ENCOUNTER — Ambulatory Visit
Admission: RE | Admit: 2019-05-25 | Discharge: 2019-05-25 | Disposition: A | Discharge status: Home / Self Care | Payer: Medicare Other | Source: Ambulatory Visit | Attending: Radiation Oncology | Admitting: Radiation Oncology

## 2019-05-25 ENCOUNTER — Other Ambulatory Visit: Payer: Self-pay

## 2019-05-25 DIAGNOSIS — Z510 Encounter for antineoplastic radiation therapy: Secondary | ICD-10-CM | POA: Diagnosis not present

## 2019-05-28 ENCOUNTER — Ambulatory Visit
Admission: RE | Admit: 2019-05-28 | Discharge: 2019-05-28 | Disposition: A | Discharge status: Home / Self Care | Payer: Medicare Other | Source: Ambulatory Visit | Attending: Radiation Oncology | Admitting: Radiation Oncology

## 2019-05-28 ENCOUNTER — Other Ambulatory Visit: Payer: Self-pay

## 2019-05-28 DIAGNOSIS — Z510 Encounter for antineoplastic radiation therapy: Secondary | ICD-10-CM | POA: Diagnosis not present

## 2019-05-29 ENCOUNTER — Ambulatory Visit
Admission: RE | Admit: 2019-05-29 | Discharge: 2019-05-29 | Disposition: A | Discharge status: Home / Self Care | Payer: Medicare Other | Source: Ambulatory Visit | Attending: Radiation Oncology | Admitting: Radiation Oncology

## 2019-05-29 ENCOUNTER — Other Ambulatory Visit: Payer: Self-pay

## 2019-05-29 DIAGNOSIS — Z510 Encounter for antineoplastic radiation therapy: Secondary | ICD-10-CM | POA: Diagnosis not present

## 2019-05-30 ENCOUNTER — Other Ambulatory Visit: Payer: Self-pay

## 2019-05-30 ENCOUNTER — Ambulatory Visit
Admission: RE | Admit: 2019-05-30 | Discharge: 2019-05-30 | Disposition: A | Discharge status: Home / Self Care | Payer: Medicare Other | Source: Ambulatory Visit | Attending: Radiation Oncology | Admitting: Radiation Oncology

## 2019-05-30 ENCOUNTER — Inpatient Hospital Stay: Payer: Medicare Other

## 2019-05-30 DIAGNOSIS — C61 Malignant neoplasm of prostate: Secondary | ICD-10-CM | POA: Diagnosis not present

## 2019-05-30 DIAGNOSIS — Z510 Encounter for antineoplastic radiation therapy: Secondary | ICD-10-CM | POA: Diagnosis not present

## 2019-05-30 LAB — CBC
HCT: 38.6 % — ABNORMAL LOW (ref 39.0–52.0)
Hemoglobin: 14.4 g/dL (ref 13.0–17.0)
MCH: 30.9 pg (ref 26.0–34.0)
MCHC: 37.3 g/dL — ABNORMAL HIGH (ref 30.0–36.0)
MCV: 82.8 fL (ref 80.0–100.0)
Platelets: 219 10*3/uL (ref 150–400)
RBC: 4.66 MIL/uL (ref 4.22–5.81)
RDW: 13.7 % (ref 11.5–15.5)
WBC: 5.7 10*3/uL (ref 4.0–10.5)
nRBC: 0 % (ref 0.0–0.2)

## 2019-05-31 ENCOUNTER — Other Ambulatory Visit: Payer: Self-pay

## 2019-05-31 ENCOUNTER — Ambulatory Visit
Admission: RE | Admit: 2019-05-31 | Discharge: 2019-05-31 | Disposition: A | Discharge status: Home / Self Care | Payer: Medicare Other | Source: Ambulatory Visit | Attending: Radiation Oncology | Admitting: Radiation Oncology

## 2019-05-31 DIAGNOSIS — Z510 Encounter for antineoplastic radiation therapy: Secondary | ICD-10-CM | POA: Diagnosis not present

## 2019-06-01 ENCOUNTER — Other Ambulatory Visit: Payer: Self-pay

## 2019-06-01 ENCOUNTER — Ambulatory Visit
Admission: RE | Admit: 2019-06-01 | Discharge: 2019-06-01 | Disposition: A | Discharge status: Home / Self Care | Payer: Medicare Other | Source: Ambulatory Visit | Attending: Radiation Oncology | Admitting: Radiation Oncology

## 2019-06-01 DIAGNOSIS — Z510 Encounter for antineoplastic radiation therapy: Secondary | ICD-10-CM | POA: Diagnosis not present

## 2019-06-04 ENCOUNTER — Ambulatory Visit
Admission: RE | Admit: 2019-06-04 | Discharge: 2019-06-04 | Disposition: A | Discharge status: Home / Self Care | Payer: Medicare Other | Source: Ambulatory Visit | Attending: Radiation Oncology | Admitting: Radiation Oncology

## 2019-06-04 ENCOUNTER — Other Ambulatory Visit: Payer: Self-pay

## 2019-06-04 DIAGNOSIS — Z510 Encounter for antineoplastic radiation therapy: Secondary | ICD-10-CM | POA: Diagnosis not present

## 2019-06-05 ENCOUNTER — Ambulatory Visit
Admission: RE | Admit: 2019-06-05 | Discharge: 2019-06-05 | Disposition: A | Discharge status: Home / Self Care | Payer: Medicare Other | Source: Ambulatory Visit | Attending: Radiation Oncology | Admitting: Radiation Oncology

## 2019-06-05 ENCOUNTER — Other Ambulatory Visit: Payer: Self-pay

## 2019-06-05 DIAGNOSIS — Z510 Encounter for antineoplastic radiation therapy: Secondary | ICD-10-CM | POA: Diagnosis not present

## 2019-06-06 ENCOUNTER — Other Ambulatory Visit: Payer: Self-pay

## 2019-06-06 ENCOUNTER — Ambulatory Visit
Admission: RE | Admit: 2019-06-06 | Discharge: 2019-06-06 | Disposition: A | Discharge status: Home / Self Care | Payer: Medicare Other | Source: Ambulatory Visit | Attending: Radiation Oncology | Admitting: Radiation Oncology

## 2019-06-06 DIAGNOSIS — Z510 Encounter for antineoplastic radiation therapy: Secondary | ICD-10-CM | POA: Diagnosis not present

## 2019-06-07 ENCOUNTER — Other Ambulatory Visit: Payer: Self-pay

## 2019-06-07 ENCOUNTER — Ambulatory Visit
Admission: RE | Admit: 2019-06-07 | Discharge: 2019-06-07 | Disposition: A | Discharge status: Home / Self Care | Payer: Medicare Other | Source: Ambulatory Visit | Attending: Radiation Oncology | Admitting: Radiation Oncology

## 2019-06-07 DIAGNOSIS — Z510 Encounter for antineoplastic radiation therapy: Secondary | ICD-10-CM | POA: Diagnosis not present

## 2019-06-08 ENCOUNTER — Other Ambulatory Visit: Payer: Self-pay

## 2019-06-08 ENCOUNTER — Ambulatory Visit
Admission: RE | Admit: 2019-06-08 | Discharge: 2019-06-08 | Disposition: A | Discharge status: Home / Self Care | Payer: Medicare Other | Source: Ambulatory Visit | Attending: Radiation Oncology | Admitting: Radiation Oncology

## 2019-06-08 DIAGNOSIS — Z510 Encounter for antineoplastic radiation therapy: Secondary | ICD-10-CM | POA: Diagnosis not present

## 2019-06-11 ENCOUNTER — Ambulatory Visit
Admission: RE | Admit: 2019-06-11 | Discharge: 2019-06-11 | Disposition: A | Discharge status: Home / Self Care | Payer: Medicare Other | Source: Ambulatory Visit | Attending: Radiation Oncology | Admitting: Radiation Oncology

## 2019-06-11 ENCOUNTER — Other Ambulatory Visit: Payer: Self-pay

## 2019-06-11 DIAGNOSIS — Z510 Encounter for antineoplastic radiation therapy: Secondary | ICD-10-CM | POA: Diagnosis not present

## 2019-06-12 ENCOUNTER — Other Ambulatory Visit: Payer: Self-pay

## 2019-06-12 ENCOUNTER — Ambulatory Visit
Admission: RE | Admit: 2019-06-12 | Discharge: 2019-06-12 | Disposition: A | Discharge status: Home / Self Care | Payer: Medicare Other | Source: Ambulatory Visit | Attending: Radiation Oncology | Admitting: Radiation Oncology

## 2019-06-12 DIAGNOSIS — Z510 Encounter for antineoplastic radiation therapy: Secondary | ICD-10-CM | POA: Diagnosis not present

## 2019-07-18 ENCOUNTER — Other Ambulatory Visit: Payer: Self-pay

## 2019-07-18 ENCOUNTER — Encounter: Payer: Self-pay | Admitting: Radiation Oncology

## 2019-07-18 ENCOUNTER — Other Ambulatory Visit: Payer: Self-pay | Admitting: *Deleted

## 2019-07-18 ENCOUNTER — Ambulatory Visit
Admission: RE | Admit: 2019-07-18 | Discharge: 2019-07-18 | Disposition: A | Discharge status: Home / Self Care | Payer: Medicare Other | Source: Ambulatory Visit | Attending: Radiation Oncology | Admitting: Radiation Oncology

## 2019-07-18 VITALS — BP 116/83 | HR 79 | Temp 97.8°F | Resp 18 | Wt 122.4 lb

## 2019-07-18 DIAGNOSIS — Z923 Personal history of irradiation: Secondary | ICD-10-CM | POA: Insufficient documentation

## 2019-07-18 DIAGNOSIS — K5900 Constipation, unspecified: Secondary | ICD-10-CM | POA: Diagnosis not present

## 2019-07-18 DIAGNOSIS — C61 Malignant neoplasm of prostate: Secondary | ICD-10-CM | POA: Diagnosis not present

## 2019-07-18 NOTE — Progress Notes (Signed)
Radiation Oncology Follow up Note  Name: Alexander Avery   Date:   07/18/2019 MRN:  [id number] DOB: [birth year]    This 63 y.o. male presents to the clinic today for 1 month follow-up status post salvage radiation therapy status post robotic assisted prostatectomy 2016 with positive margins and now with biochemical recurrence.  REFERRING PROVIDER: Danelle Berry, NP  HPI: Patient is a 63 year old male now at 1 month having completed salvage radiation therapy to his prostate for stage IIb Gleason 7 (3+4) adenocarcinoma of the prostate status post robotic assisted prostatectomy 2016 with positive margins and now with biochemical failure.  Seen today 1 month out he is doing well.  He specifically denies any increased lower urinary tract symptoms or diarrhea.  He tends towards constipation..  COMPLICATIONS OF TREATMENT: none  FOLLOW UP COMPLIANCE: keeps appointments   PHYSICAL EXAM:  BP 116/83   Pulse 79   Temp 97.8 F (36.6 C)   Resp 18   Wt 122 lb 6.4 oz (55.5 kg)   BMI 19.76 kg/m  Well-developed well-nourished patient in NAD. HEENT reveals PERLA, EOMI, discs not visualized.  Oral cavity is clear. No oral mucosal lesions are identified. Neck is clear without evidence of cervical or supraclavicular adenopathy. Lungs are clear to A&P. Cardiac examination is essentially unremarkable with regular rate and rhythm without murmur rub or thrill. Abdomen is benign with no organomegaly or masses noted. Motor sensory and DTR levels are equal and symmetric in the upper and lower extremities. Cranial nerves II through XII are grossly intact. Proprioception is intact. No peripheral adenopathy or edema is identified. No motor or sensory levels are noted. Crude visual fields are within normal range.  RADIOLOGY RESULTS: No current films for review  PLAN: Present time patient is doing well with low side effect profile status post salvage radiation therapy to his prostatic bed.  I am pleased with his  overall progress.  I asked to see him back in 3 months for follow-up with a PSA prior to his visit.  Patient knows to call with any concerns.  I would like to take this opportunity to thank you for allowing me to participate in the care of your patient.Noreene Filbert, MD

## 2019-07-18 NOTE — Progress Notes (Signed)
PSA

## 2019-10-03 ENCOUNTER — Telehealth: Payer: Self-pay | Admitting: Urology

## 2019-10-03 NOTE — Telephone Encounter (Signed)
PA for Lupron  [id number] 05-08-19 through 05-07-2020 1st injection 05-14-19  [facility]

## 2019-10-15 ENCOUNTER — Other Ambulatory Visit: Payer: Self-pay

## 2019-10-15 DIAGNOSIS — Z20822 Contact with and (suspected) exposure to covid-19: Secondary | ICD-10-CM

## 2019-10-16 ENCOUNTER — Other Ambulatory Visit: Payer: Self-pay

## 2019-10-16 LAB — NOVEL CORONAVIRUS, NAA: SARS-CoV-2, NAA: NOT DETECTED

## 2019-10-17 ENCOUNTER — Inpatient Hospital Stay: Payer: Medicare Other

## 2019-10-24 ENCOUNTER — Ambulatory Visit: Payer: Medicare Other | Admitting: Radiation Oncology

## 2019-11-19 ENCOUNTER — Other Ambulatory Visit: Payer: Self-pay | Admitting: *Deleted

## 2019-11-19 DIAGNOSIS — C61 Malignant neoplasm of prostate: Secondary | ICD-10-CM

## 2019-11-20 ENCOUNTER — Other Ambulatory Visit: Payer: Self-pay

## 2019-11-20 ENCOUNTER — Other Ambulatory Visit: Payer: Medicare HMO

## 2019-11-20 DIAGNOSIS — C61 Malignant neoplasm of prostate: Secondary | ICD-10-CM

## 2019-11-21 LAB — PSA: Prostate Specific Ag, Serum: <0.1 ng/mL (ref 0.0–4.0)

## 2019-11-21 NOTE — Progress Notes (Signed)
01/31/2019 10:54 AM   Alexander Avery 08/19/1956 [id number]  Referring provider: Danelle Berry, NP [address] [place],  [place] [address]  Chief Complaint  Patient presents with  . Prostate Cancer    HPI: Alexander Avery is a 64 y.o. male with prostate cancer, LU TS and ED who presents today for a 6 months follow up.    Prostate cancer He had underwent a prostatectomy in 2016 in [place], [place] for prostate cancer.  He states he was told his cancer was in the early stage.  He states that that he has had three "scrapings" through his penis in the urology office the first year after his prostate surgery.  According to records received from [facility], he had iPSA 15.0 - DRE nodularity and induration in the left lobe.  Biopsy was performed on 02/05/2015.  His prostate was 20.5 grams PSA density of 0.73.  Gleason score 6 and 7 (3+4) involving all 6 biopsies zones.  RRP on 04/21/2015 -pathology showed a Gleason score 7 involving 25% of the gland with negative surgical margins.  3 weeks after prostatectomy patient went into urinary retention.  Cystoscopy performed in July 2016 noted anterior urethra was unremarkable and the vesicular urethral anastomosis was open.  PSA was undetectable in August 2016.  He underwent urethral dilation for a stricture at the vesicourethral anastomosis on November 2016 with Leander Rams sounds.  On cytoscopy with Dr. Bernardo Heater on 09/15/2018, no evidence of urethral stricture or bladder neck contracture.  BPH WITH LUTS  (prostate and/or bladder) IPSS score: 20/5    Previous score: 35/6   Previous PVR: 0 mL  Major complaint(s):  Nocturia and frequency - both about every hour.  Denies any dysuria, hematuria or suprapubic pain.   Currently taking: Myrbetriq occasionally as it is cost prohibitive   Denies any recent fevers, chills, nausea or vomiting.  IPSS    Row Name 11/22/19 1000         International Prostate Symptom Score   How often have you had the  sensation of not emptying your bladder?  Less than half the time     How often have you had to urinate less than every two hours?  About half the time     How often have you found you stopped and started again several times when you urinated?  About half the time     How often have you found it difficult to postpone urination?  More than half the time     How often have you had a weak urinary stream?  Less than half the time     How often have you had to strain to start urination?  Less than half the time     How many times did you typically get up at night to urinate?  4 Times     Total IPSS Score  20       Quality of Life due to urinary symptoms   If you were to spend the rest of your life with your urinary condition just the way it is now how would you feel about that?  Unhappy        Score:  1-7 Mild 8-19 Moderate 20-35 Severe  Erectile dysfunction SHIM score: 14   Previous SHIM score: 19 Main complaint: achieving erections since prostatectomy  Risk factors:  age, pelvic radiation, prostate cancer, DM, HTN, HLD, hypothyroidism, sleep apnea, stress, anxiety, alcohol abuse and smoking No painful erections or curvatures with his  erections.    No longer having spontaneous erections Tried:  Nothing   SHIM    Row Name 11/22/19 1013         SHIM: Over the last 6 months:   How do you rate your confidence that you could get and keep an erection?  Low     When you had erections with sexual stimulation, how often were your erections hard enough for penetration (entering your partner)?  Sometimes (about half the time)     During sexual intercourse, how often were you able to maintain your erection after you had penetrated (entered) your partner?  Sometimes (about half the time)     During sexual intercourse, how difficult was it to maintain your erection to completion of intercourse?  Difficult     When you attempted sexual intercourse, how often was it satisfactory for you?  Sometimes  (about half the time)       SHIM Total Score   SHIM  14        Score: 1-7 Severe ED 8-11 Moderate ED 12-16 Mild-Moderate ED 17-21 Mild ED 22-25 No ED  PMH: Past Medical History:  Diagnosis Date  . Anxiety   . Arthritis   . Cancer [facility])    prostate  . Cataract 2019  . Diabetes mellitus without complication ([place])   . GERD (gastroesophageal reflux disease)   . Hernia of abdominal wall 2015  . History of stomach ulcers   . Hypertension   . Prostate cancer ([place])   . Seizures ([place])   . Sleep apnea     Surgical History: Past Surgical History:  Procedure Laterality Date  . CATARACT EXTRACTION W/PHACO Right 03/30/2018   Procedure: CATARACT EXTRACTION PHACO AND INTRAOCULAR LENS PLACEMENT (IOC);  Surgeon: Eulogio Bear, MD;  Location: [facility] ORS;  Service: Ophthalmology;  Laterality: Right;  [place] 01:09.5 AP% 10.4 CDE 7.98 Fluid pack lot # [id number]  . CATARACT EXTRACTION W/PHACO Left 06/29/2018   Procedure: CATARACT EXTRACTION PHACO AND INTRAOCULAR LENS PLACEMENT (IOC);  Surgeon: Eulogio Bear, MD;  Location: [facility] ORS;  Service: Ophthalmology;  Laterality: Left;  [place] 01:27.7 AP% 8.2 CDE 7.27 Fluid pack lot # [id number]  . EYE SURGERY    . HERNIA REPAIR    . PROSTATE SURGERY    . PROSTATE SURGERY  2016    Home Medications:  Allergies as of 11/22/2019      Reactions   Penicillins Hives, Other (See Comments)   Urinate on self, pass out Has patient had a PCN reaction causing immediate rash, facial/tongue/throat swelling, SOB or lightheadedness with hypotension: Yes Has patient had a PCN reaction causing severe rash involving mucus membranes or skin necrosis: No Has patient had a PCN reaction that required hospitalization: Yes- In hospital Has patient had a PCN reaction occurring within the last 10 years: Yes If all of the above answers are "NO", then may proceed with Cephalosporin use.      Medication List       Accurate as of November 22, 2019 10:54 AM. If you have any  questions, ask your nurse or doctor.        acetaminophen 325 MG tablet Commonly known as: TYLENOL Take 650 mg by mouth every 6 (six) hours as needed for moderate pain or headache.   atorvastatin 40 MG tablet Commonly known as: LIPITOR Take 40 mg by mouth at bedtime.   Durezol 0.05 % Emul Generic drug: Difluprednate Place 1 drop into the right eye 2 (two) times daily.  hydrocortisone 2.5 % cream Apply topically 2 (two) times daily.   Invokana 300 MG Tabs tablet Generic drug: canagliflozin Take 300 mg by mouth daily before breakfast.   Jardiance 10 MG Tabs tablet Generic drug: empagliflozin TAKE 1 TABLET BY MOUTH IN THE MORNING FOR DIABETES   losartan-hydrochlorothiazide 100-25 MG tablet Commonly known as: HYZAAR Take 1 tablet by mouth daily.   meclizine 12.5 MG tablet Commonly known as: ANTIVERT Take 1 tablet (12.5 mg total) by mouth 2 (two) times daily as needed for dizziness.   metFORMIN 500 MG tablet Commonly known as: GLUCOPHAGE Take 500 mg by mouth 2 (two) times daily with a meal.   mirabegron ER 25 MG Tb24 tablet Commonly known as: MYRBETRIQ Take 1 tablet (25 mg total) by mouth daily. What changed: Another medication with the same name was removed. Continue taking this medication, and follow the directions you see here. Changed by: [name], PA-C   oxybutynin 10 MG 24 hr tablet Commonly known as: DITROPAN-XL Take 1 tablet (10 mg total) by mouth daily. Started by: [name], PA-C   sildenafil 100 MG tablet Commonly known as: VIAGRA Take 1 tablet (100 mg total) by mouth daily as needed for erectile dysfunction. Take two hours prior to intercourse on an empty stomach Started by: [name], PA-C   sildenafil 20 MG tablet Commonly known as: REVATIO Take 3 to 5 tablets two hours before intercouse on an empty stomach.  Do not take with nitrates.       Allergies:  Allergies  Allergen Reactions  . Penicillins Hives and Other (See  Comments)    Urinate on self, pass out Has patient had a PCN reaction causing immediate rash, facial/tongue/throat swelling, SOB or lightheadedness with hypotension: Yes Has patient had a PCN reaction causing severe rash involving mucus membranes or skin necrosis: No Has patient had a PCN reaction that required hospitalization: Yes- In hospital Has patient had a PCN reaction occurring within the last 10 years: Yes If all of the above answers are "NO", then may proceed with Cephalosporin use.     Family History: Family History  Problem Relation Age of Onset  . Cancer Mother     Social History:  reports that he has been smoking. He has been smoking about 2.00 packs per day. He has never used smokeless tobacco. He reports current alcohol use of about 2.0 standard drinks of alcohol per week. He reports that he does not use drugs.  ROS: UROLOGY Frequent Urination?: No Hard to postpone urination?: No Burning/pain with urination?: No Get up at night to urinate?: No Leakage of urine?: No Urine stream starts and stops?: No Trouble starting stream?: No Do you have to strain to urinate?: No Blood in urine?: No Urinary tract infection?: No Sexually transmitted disease?: No Injury to kidneys or bladder?: No Painful intercourse?: No Weak stream?: No Erection problems?: No Penile pain?: No  Gastrointestinal Nausea?: No Vomiting?: No Indigestion/heartburn?: No Diarrhea?: No Constipation?: No  Constitutional Fever: No Night sweats?: No Weight loss?: No Fatigue?: No  Skin Skin rash/lesions?: No Itching?: No  Eyes Blurred vision?: No Double vision?: No  Ears/Nose/Throat Sore throat?: No Sinus problems?: No  Hematologic/Lymphatic Swollen glands?: No Easy bruising?: No  Cardiovascular Leg swelling?: No Chest pain?: No  Respiratory Cough?: No Shortness of breath?: No  Endocrine Excessive thirst?: No  Musculoskeletal Back pain?: No Joint pain?:  No  Neurological Headaches?: No Dizziness?: No  Psychologic Depression?: No Anxiety?: No  Physical Exam: BP 138/85  Pulse 64   Ht 5\' 6"  (1.676 m)   Wt 131 lb 12.8 oz (59.8 kg)   BMI 21.27 kg/m   Constitutional:  Well nourished. Alert and oriented, No acute distress. HEENT: [place] AT, mask in place.  Trachea midline, no masses. Cardiovascular: No clubbing, cyanosis, or edema. Respiratory: Normal respiratory effort, no increased work of breathing. GI: Abdomen is soft, non tender, non distended, no abdominal masses. Liver and spleen not palpable.  No hernias appreciated.  Stool sample for occult testing is not indicated.   GU: No CVA tenderness.  No bladder fullness or masses.  Patient with circumcised phallus.  Urethral meatus is patent.  No penile discharge. No penile lesions or rashes. Scrotum without lesions, cysts, rashes and/or edema.  Testicles are located scrotally bilaterally. No masses are appreciated in the testicles. Left and right epididymis are normal. Rectal: Not indicated Skin: No rashes, bruises or suspicious lesions. Lymph: No inguinal adenopathy. Neurologic: Grossly intact, no focal deficits, moving all 4 extremities. Psychiatric: Normal mood and affect.  Laboratory Data: Lab Results  Component Value Date   WBC 5.7 05/30/2019   HGB 14.4 05/30/2019   HCT 38.6 (L) 05/30/2019   MCV 82.8 05/30/2019   PLT 219 05/30/2019    Lab Results  Component Value Date   CREATININE 1.50 (H) 02/13/2019    Component     Latest Ref Rng & Units 09/04/2018 12/19/2018 11/20/2019  Prostate Specific Ag, Serum     0.0 - 4.0 ng/mL 0.4 0.8 <0.1   I have reviewed the labs.   Assessment & Plan:    1. Prostate cancer Completed salvage radiation therapy 07/2019 to his prostate for stage IIb Gleason 7 (3+4) adenocarcinoma of the prostate status post robotic assisted prostatectomy 2016 with positive margins  PSA undetectable  RTC in 6 months for PSA  2. LUTS IPSS score is 20/5, it  is stable Continue conservative management, avoiding bladder irritants and timed voiding's Most bothersome symptoms is/are frequency and nocturia Myrbetriq effective, but cost prohibitive Will try oxybutynin XL 10 mg daily - script sent to [place] Will try RTC in 6 weeks for I PSS and PVR  3. Erectile dysfunction SHIM score is 14, worsening He would like to try Viagra - script given for Sildenafil 100 mg, 1 tablet two hours prior to intercourse on an empty stomach, # 30; he is warned not to take medications that contain nitrates.  I also advised him of the side effects, such as: headache, flushing, dyspepsia, abnormal vision, nasal congestion, back pain, myalgia, nausea, dizziness, and rash. RTC in 6 weeks for SHIM score   Return in about 6 weeks (around 01/03/2020) for IPSS and PVR.  [name], PA-C   [facility] [address]  [place], [place] [address] [phone]

## 2019-11-22 ENCOUNTER — Ambulatory Visit (INDEPENDENT_AMBULATORY_CARE_PROVIDER_SITE_OTHER): Payer: Medicare HMO | Admitting: Urology

## 2019-11-22 ENCOUNTER — Encounter: Payer: Self-pay | Admitting: Urology

## 2019-11-22 ENCOUNTER — Other Ambulatory Visit: Payer: Self-pay

## 2019-11-22 VITALS — BP 138/85 | HR 64 | Ht 66.0 in | Wt 131.8 lb

## 2019-11-22 DIAGNOSIS — R399 Unspecified symptoms and signs involving the genitourinary system: Secondary | ICD-10-CM

## 2019-11-22 DIAGNOSIS — N529 Male erectile dysfunction, unspecified: Secondary | ICD-10-CM

## 2019-11-22 DIAGNOSIS — C61 Malignant neoplasm of prostate: Secondary | ICD-10-CM | POA: Diagnosis not present

## 2019-11-22 MED ORDER — SILDENAFIL CITRATE 100 MG PO TABS: 100.0000 mg | ORAL_TABLET | Freq: Every day | ORAL | 3 refills | Status: DC | PRN

## 2019-11-22 MED ORDER — OXYBUTYNIN CHLORIDE ER 10 MG PO TB24: 10.0000 mg | ORAL_TABLET | Freq: Every day | ORAL | 0 refills | Status: DC

## 2019-11-26 ENCOUNTER — Other Ambulatory Visit: Payer: Self-pay | Admitting: *Deleted

## 2019-11-26 ENCOUNTER — Encounter: Payer: Self-pay | Admitting: Radiation Oncology

## 2019-11-26 ENCOUNTER — Other Ambulatory Visit: Payer: Self-pay

## 2019-11-26 ENCOUNTER — Ambulatory Visit
Admission: RE | Admit: 2019-11-26 | Discharge: 2019-11-26 | Disposition: A | Discharge status: Home / Self Care | Payer: Medicare HMO | Source: Ambulatory Visit | Attending: Radiation Oncology | Admitting: Radiation Oncology

## 2019-11-26 VITALS — BP 173/100 | HR 66 | Temp 97.4°F | Resp 16 | Wt 130.5 lb

## 2019-11-26 DIAGNOSIS — Z923 Personal history of irradiation: Secondary | ICD-10-CM | POA: Insufficient documentation

## 2019-11-26 DIAGNOSIS — C61 Malignant neoplasm of prostate: Secondary | ICD-10-CM | POA: Diagnosis not present

## 2019-11-26 NOTE — Progress Notes (Signed)
Radiation Oncology Follow up Note  Name: Alexander Avery   Date:   11/26/2019 MRN:  [id number] DOB: 11/07/1956    This 64 y.o. male presents to the clinic today for 67-month follow-up status post salvage radiation therapy to his prostatic fossa status post robotic assisted prostatectomy in 2016 with positive margins and biochemical recurrence.  REFERRING PROVIDER: Danelle Berry, NP  HPI: Patient is a 64 year old male now seen out 4 months since completing salvage radiation therapy to his prostatic fossa status post prostatectomy in 2016 with positive margins and biochemical recurrence.  Seen today in routine follow-up he is doing still doing well.  Still has some slight burning on urination.  That is improved also some slight increased urgency and frequency no diarrhea.  His most recent PSA is less than 0.1.Marland Kitchen  COMPLICATIONS OF TREATMENT: none  FOLLOW UP COMPLIANCE: keeps appointments   PHYSICAL EXAM:  BP (!) 173/100 (BP Location: Left Arm)   Pulse 66   Temp (!) 97.4 F (36.3 C) (Tympanic)   Resp 16   Wt 130 lb 8 oz (59.2 kg)   BMI 21.06 kg/m  Well-developed well-nourished patient in NAD. HEENT reveals PERLA, EOMI, discs not visualized.  Oral cavity is clear. No oral mucosal lesions are identified. Neck is clear without evidence of cervical or supraclavicular adenopathy. Lungs are clear to A&P. Cardiac examination is essentially unremarkable with regular rate and rhythm without murmur rub or thrill. Abdomen is benign with no organomegaly or masses noted. Motor sensory and DTR levels are equal and symmetric in the upper and lower extremities. Cranial nerves II through XII are grossly intact. Proprioception is intact. No peripheral adenopathy or edema is identified. No motor or sensory levels are noted. Crude visual fields are within normal range.  RADIOLOGY RESULTS: No current films to review  PLAN: Present time patient is doing well under excellent biochemical control of his recurrent  prostate cancer and pleased with his overall progress.  Of asked to see him back in 6 months for follow-up.  We will repeat his PSA at that time.  Also is still having some slight skin reaction for which she has been using 1% hydrocortisone cream that will continue.  Patient knows to call with any concerns.  I would like to take this opportunity to thank you for allowing me to participate in the care of your patient.Noreene Filbert, MD

## 2019-12-10 ENCOUNTER — Encounter: Payer: Self-pay | Admitting: *Deleted

## 2020-01-03 ENCOUNTER — Ambulatory Visit: Payer: Medicare HMO | Admitting: Urology

## 2020-01-11 ENCOUNTER — Ambulatory Visit: Payer: Medicare HMO | Admitting: Physician Assistant

## 2020-01-18 ENCOUNTER — Encounter: Payer: Self-pay | Admitting: Radiology

## 2020-01-24 ENCOUNTER — Ambulatory Visit: Payer: Medicare HMO | Admitting: Urology

## 2020-01-29 NOTE — Progress Notes (Signed)
01/31/2019 12:10 PM   Alexander Avery 25-Jan-1956 [id number]  Referring provider: Danelle Berry, NP [address] [place]  [address]  Chief Complaint  Patient presents with  . Prostate Cancer    HPI: Alexander Avery is a 64 y.o. male with prostate cancer, LU TS and ED who presents today for a 6 months follow up.  He is also getting an IPSS and PVR   Prostate cancer He had underwent a prostatectomy in 2016 in [place], [place] for prostate cancer.  He states he was told his cancer was in the early stage.  He states that that he has had three "scrapings" through his penis in the urology office the first year after his prostate surgery.  According to records received from [facility], he had iPSA 15.0 - DRE nodularity and induration in the left lobe.  Biopsy was performed on 02/05/2015.  His prostate was 20.5 grams PSA density of 0.73.  Gleason score 6 and 7 (3+4) involving all 6 biopsies zones.  RRP on 04/21/2015 -pathology showed a Gleason score 7 involving 25% of the gland with negative surgical margins.  3 weeks after prostatectomy patient went into urinary retention.  Cystoscopy performed in July 2016 noted anterior urethra was unremarkable and the vesicular urethral anastomosis was open.  PSA was undetectable in August 2016.  He underwent urethral dilation for a stricture at the vesicourethral anastomosis on November 2016 with Leander Rams sounds.  On cytoscopy with Dr. Bernardo Heater on 09/15/2018, no evidence of urethral stricture or bladder neck contracture.  He has had salvage radiation.  PSA <0.1 in 11/2019.   BPH WITH LUTS  (prostate and/or bladder) IPSS score: 18    Previous score: 20/5   Previous PVR: 0 mL  He reports of mixed urinary symptoms including incontinence when he thinks about urinating. He states oxybutynin provided mild relief however not significant enough to not be bothersome. He reports of side effects including dry mouth and mild constipation.  He denies  dysuria, gross hematuria, suprapubic pain, flank pain and F/N. He reports of chills and hot flashes at night which is attributed to Lupron he was receiving a while back.  He is not interested in further use of medication or PT for kegal exercises.  IPSS    Row Name 01/31/20 1000         International Prostate Symptom Score   How often have you had the sensation of not emptying your bladder?  About half the time     How often have you had to urinate less than every two hours?  More than half the time     How often have you found you stopped and started again several times when you urinated?  Less than half the time     How often have you found it difficult to postpone urination?  Less than half the time     How often have you had a weak urinary stream?  Less than half the time     How often have you had to strain to start urination?  Not at All     How many times did you typically get up at night to urinate?  5 Times     Total IPSS Score  18       Quality of Life due to urinary symptoms   If you were to spend the rest of your life with your urinary condition just the way it is now how would you feel about  that?  Mixed        Score:   1-7 Mild 8-19 Moderate 20-35 Severe  Erectile dysfunction SHIM score: 12   Previous SHIM score: 14 Main complaint: achieving erections since prostatectomy, Pt is satisfied with Viagra. Risk factors:  age, pelvic radiation, prostate cancer, DM, HTN, HLD, hypothyroidism, sleep apnea, stress, anxiety, alcohol abuse and smoking No painful erections or curvatures with his erections.    No longer having spontaneous erections He is not taking any medications for the heart.   SHIM    Row Name 01/31/20 1102         SHIM: Over the last 6 months:   How do you rate your confidence that you could get and keep an erection?  Moderate     When you had erections with sexual stimulation, how often were your erections hard enough for penetration (entering your  partner)?  A Few Times (much less than half the time)     During sexual intercourse, how often were you able to maintain your erection after you had penetrated (entered) your partner?  A Few Times (much less than half the time)     During sexual intercourse, how difficult was it to maintain your erection to completion of intercourse?  Very Difficult     When you attempted sexual intercourse, how often was it satisfactory for you?  Sometimes (about half the time)       SHIM Total Score   SHIM  12        Score: 1-7 Severe ED 8-11 Moderate ED 12-16 Mild-Moderate ED 17-21 Mild ED 22-25 No ED  PMH: Past Medical History:  Diagnosis Date  . Anxiety   . Arthritis   . Cancer [facility])    prostate  . Cataract 2019  . Diabetes mellitus without complication ([place])   . GERD (gastroesophageal reflux disease)   . Hernia of abdominal wall 2015  . History of stomach ulcers   . Hypertension   . Prostate cancer ([place])   . Seizures ([place])   . Sleep apnea     Surgical History: Past Surgical History:  Procedure Laterality Date  . CATARACT EXTRACTION W/PHACO Right 03/30/2018   Procedure: CATARACT EXTRACTION PHACO AND INTRAOCULAR LENS PLACEMENT (IOC);  Surgeon: Eulogio Bear, MD;  Location: [facility] ORS;  Service: Ophthalmology;  Laterality: Right;  [place] 01:09.5 AP% 10.4 CDE 7.98 Fluid pack lot # [id number]  . CATARACT EXTRACTION W/PHACO Left 06/29/2018   Procedure: CATARACT EXTRACTION PHACO AND INTRAOCULAR LENS PLACEMENT (IOC);  Surgeon: Eulogio Bear, MD;  Location: [facility] ORS;  Service: Ophthalmology;  Laterality: Left;  [place] 01:27.7 AP% 8.2 CDE 7.27 Fluid pack lot # [id number]  . EYE SURGERY    . HERNIA REPAIR    . PROSTATE SURGERY    . PROSTATE SURGERY  2016    Home Medications:  Allergies as of 01/31/2020      Reactions   Penicillins Hives, Other (See Comments)   Urinate on self, pass out Has patient had a PCN reaction causing immediate rash, facial/tongue/throat swelling, SOB or  lightheadedness with hypotension: Yes Has patient had a PCN reaction causing severe rash involving mucus membranes or skin necrosis: No Has patient had a PCN reaction that required hospitalization: Yes- In hospital Has patient had a PCN reaction occurring within the last 10 years: Yes If all of the above answers are "NO", then may proceed with Cephalosporin use.      Medication List       Accurate as  of January 31, 2020 12:10 PM. If you have any questions, ask your nurse or doctor.        acetaminophen 325 MG tablet Commonly known as: TYLENOL Take 650 mg by mouth every 6 (six) hours as needed for moderate pain or headache.   atorvastatin 40 MG tablet Commonly known as: LIPITOR Take 40 mg by mouth at bedtime.   Durezol 0.05 % Emul Generic drug: Difluprednate Place 1 drop into the right eye 2 (two) times daily.   hydrocortisone 2.5 % cream Apply topically 2 (two) times daily.   Invokana 300 MG Tabs tablet Generic drug: canagliflozin Take 300 mg by mouth daily before breakfast.   Jardiance 10 MG Tabs tablet Generic drug: empagliflozin TAKE 1 TABLET BY MOUTH IN THE MORNING FOR DIABETES   losartan-hydrochlorothiazide 100-25 MG tablet Commonly known as: HYZAAR Take 1 tablet by mouth daily.   meclizine 12.5 MG tablet Commonly known as: ANTIVERT Take 1 tablet (12.5 mg total) by mouth 2 (two) times daily as needed for dizziness.   metFORMIN 500 MG tablet Commonly known as: GLUCOPHAGE Take 500 mg by mouth 2 (two) times daily with a meal.   oxybutynin 10 MG 24 hr tablet Commonly known as: DITROPAN-XL Take 1 tablet (10 mg total) by mouth daily.   sildenafil 100 MG tablet Commonly known as: VIAGRA Take 1 tablet (100 mg total) by mouth daily as needed for erectile dysfunction. Take two hours prior to intercourse on an empty stomach   sildenafil 20 MG tablet Commonly known as: REVATIO Take 3 to 5 tablets two hours before intercouse on an empty stomach.  Do not take with  nitrates.       Allergies:  Allergies  Allergen Reactions  . Penicillins Hives and Other (See Comments)    Urinate on self, pass out Has patient had a PCN reaction causing immediate rash, facial/tongue/throat swelling, SOB or lightheadedness with hypotension: Yes Has patient had a PCN reaction causing severe rash involving mucus membranes or skin necrosis: No Has patient had a PCN reaction that required hospitalization: Yes- In hospital Has patient had a PCN reaction occurring within the last 10 years: Yes If all of the above answers are "NO", then may proceed with Cephalosporin use.     Family History: Family History  Problem Relation Age of Onset  . Cancer Mother     Social History:  reports that he has been smoking. He has been smoking about 2.00 packs per day. He has never used smokeless tobacco. He reports current alcohol use of about 2.0 standard drinks of alcohol per week. He reports that he does not use drugs.  ROS: For pertinent review of systems please refer to history of present illness  Physical Exam: BP (!) 198/84   Pulse 67   Ht 5\' 6"  (1.676 m)   Wt 134 lb 4.8 oz (60.9 kg)   BMI 21.68 kg/m   Constitutional:  Well nourished. Alert and oriented, No acute distress. HEENT: [place] AT, moist mucus membranes.  Trachea midline, no masses. Cardiovascular: No clubbing, cyanosis, or edema. Respiratory: Normal respiratory effort, no increased work of breathing. Skin: No rashes, bruises or suspicious lesions. Neurologic: Grossly intact, no focal deficits, moving all 4 extremities. Psychiatric: Normal mood and affect.  Laboratory Data:  Lab Results  Component Value Date   CREATININE 1.50 (H) 02/13/2019   Component     Latest Ref Rng & Units 09/04/2018 12/19/2018 11/20/2019  Prostate Specific Ag, Serum     0.0 - 4.0 ng/mL  0.4 0.8 <0.1   I have reviewed the labs.  Assessment & Plan:    1. Prostate cancer Completed salvage radiation therapy 07/2019 to his prostate for  stage IIb Gleason 7 (3+4) adenocarcinoma of the prostate status post robotic assisted prostatectomy 2016 with positive margins  PSA undetectable  RTC in 6 months for PSA  2. LUTS IPSS score 18 it is stable. Continue conservative management, avoiding bladder irritants and timed voiding's Most bothersome symptoms is/are frequency and nocturia Myrbetriq effective, but cost prohibitive Pt not interested in up-titration of Oxybutynin or PT for kegal exercises   Will check PSA q 6 months.  Will f/u with Dr. Baruch Gouty in July and we will f/u with pt in 1 year for PSA/IPSS/SHIM/DRE/PVR  3. Erectile dysfunction SHIM score is 12, moderate ED Pt satisfied with Viagara; refills given RTC in 6 weeks for SHIM score   Return in about 6 months (around 08/02/2020) for IPSS, SHIM, PSA and exam.  [name], PA-C   [facility] [address]  [place], [place] [address] [phone] I, Stacie Glaze, am acting as a Education administrator for [employer], PA-C.  I have reviewed the above documentation for accuracy and completeness, and I agree with the above.    [name], PA-C

## 2020-01-31 ENCOUNTER — Ambulatory Visit (INDEPENDENT_AMBULATORY_CARE_PROVIDER_SITE_OTHER): Payer: Medicare HMO | Admitting: Urology

## 2020-01-31 ENCOUNTER — Other Ambulatory Visit: Payer: Self-pay | Admitting: Family Medicine

## 2020-01-31 ENCOUNTER — Other Ambulatory Visit: Payer: Self-pay

## 2020-01-31 ENCOUNTER — Encounter: Payer: Self-pay | Admitting: Urology

## 2020-01-31 VITALS — BP 198/84 | HR 67 | Ht 66.0 in | Wt 134.3 lb

## 2020-01-31 DIAGNOSIS — N529 Male erectile dysfunction, unspecified: Secondary | ICD-10-CM

## 2020-01-31 DIAGNOSIS — C61 Malignant neoplasm of prostate: Secondary | ICD-10-CM | POA: Diagnosis not present

## 2020-01-31 DIAGNOSIS — R399 Unspecified symptoms and signs involving the genitourinary system: Secondary | ICD-10-CM | POA: Diagnosis not present

## 2020-01-31 LAB — BLADDER SCAN AMB NON-IMAGING: Scan Result: 0

## 2020-01-31 MED ORDER — SILDENAFIL CITRATE 100 MG PO TABS: 100.0000 mg | ORAL_TABLET | Freq: Every day | ORAL | 3 refills | Status: DC | PRN

## 2020-04-17 ENCOUNTER — Encounter: Payer: Self-pay | Admitting: Emergency Medicine

## 2020-04-17 ENCOUNTER — Emergency Department: Payer: Medicare HMO

## 2020-04-17 ENCOUNTER — Other Ambulatory Visit: Payer: Self-pay

## 2020-04-17 DIAGNOSIS — F1410 Cocaine abuse, uncomplicated: Secondary | ICD-10-CM | POA: Diagnosis not present

## 2020-04-17 DIAGNOSIS — R42 Dizziness and giddiness: Secondary | ICD-10-CM | POA: Diagnosis not present

## 2020-04-17 DIAGNOSIS — Z79899 Other long term (current) drug therapy: Secondary | ICD-10-CM | POA: Diagnosis not present

## 2020-04-17 DIAGNOSIS — R569 Unspecified convulsions: Secondary | ICD-10-CM | POA: Diagnosis not present

## 2020-04-17 DIAGNOSIS — F17210 Nicotine dependence, cigarettes, uncomplicated: Secondary | ICD-10-CM | POA: Diagnosis not present

## 2020-04-17 DIAGNOSIS — Z7984 Long term (current) use of oral hypoglycemic drugs: Secondary | ICD-10-CM | POA: Diagnosis not present

## 2020-04-17 DIAGNOSIS — F1210 Cannabis abuse, uncomplicated: Secondary | ICD-10-CM | POA: Insufficient documentation

## 2020-04-17 DIAGNOSIS — F1010 Alcohol abuse, uncomplicated: Secondary | ICD-10-CM | POA: Diagnosis not present

## 2020-04-17 DIAGNOSIS — Z8546 Personal history of malignant neoplasm of prostate: Secondary | ICD-10-CM | POA: Insufficient documentation

## 2020-04-17 DIAGNOSIS — E119 Type 2 diabetes mellitus without complications: Secondary | ICD-10-CM | POA: Diagnosis not present

## 2020-04-17 DIAGNOSIS — I10 Essential (primary) hypertension: Secondary | ICD-10-CM | POA: Diagnosis not present

## 2020-04-17 LAB — CBC WITH DIFFERENTIAL/PLATELET
Abs Immature Granulocytes: 0.01 10*3/uL (ref 0.00–0.07)
Basophils Absolute: 0 10*3/uL (ref 0.0–0.1)
Basophils Relative: 1 %
Eosinophils Absolute: 0.1 10*3/uL (ref 0.0–0.5)
Eosinophils Relative: 3 %
HCT: 38.2 % — ABNORMAL LOW (ref 39.0–52.0)
Hemoglobin: 14 g/dL (ref 13.0–17.0)
Immature Granulocytes: 0 %
Lymphocytes Relative: 26 %
Lymphs Abs: 1.3 10*3/uL (ref 0.7–4.0)
MCH: 29.7 pg (ref 26.0–34.0)
MCHC: 36.6 g/dL — ABNORMAL HIGH (ref 30.0–36.0)
MCV: 81.1 fL (ref 80.0–100.0)
Monocytes Absolute: 0.6 10*3/uL (ref 0.1–1.0)
Monocytes Relative: 11 %
Neutro Abs: 3 10*3/uL (ref 1.7–7.7)
Neutrophils Relative %: 59 %
Platelets: 164 10*3/uL (ref 150–400)
RBC: 4.71 MIL/uL (ref 4.22–5.81)
RDW: 13.3 % (ref 11.5–15.5)
WBC: 5.1 10*3/uL (ref 4.0–10.5)
nRBC: 0 % (ref 0.0–0.2)

## 2020-04-17 LAB — URINE DRUG SCREEN, QUALITATIVE (ARMC ONLY)
Amphetamines, Ur Screen: NOT DETECTED
Barbiturates, Ur Screen: NOT DETECTED
Benzodiazepine, Ur Scrn: NOT DETECTED
Cannabinoid 50 Ng, Ur ~~LOC~~: POSITIVE — AB
Cocaine Metabolite,Ur ~~LOC~~: POSITIVE — AB
MDMA (Ecstasy)Ur Screen: NOT DETECTED
Methadone Scn, Ur: NOT DETECTED
Opiate, Ur Screen: NOT DETECTED
Phencyclidine (PCP) Ur S: NOT DETECTED
Tricyclic, Ur Screen: NOT DETECTED

## 2020-04-17 LAB — COMPREHENSIVE METABOLIC PANEL
ALT: 19 U/L (ref 0–44)
AST: 28 U/L (ref 15–41)
Albumin: 3.7 g/dL (ref 3.5–5.0)
Alkaline Phosphatase: 62 U/L (ref 38–126)
Anion gap: 11 (ref 5–15)
BUN: 12 mg/dL (ref 8–23)
CO2: 27 mmol/L (ref 22–32)
Calcium: 9.2 mg/dL (ref 8.9–10.3)
Chloride: 101 mmol/L (ref 98–111)
Creatinine, Ser: 1.14 mg/dL (ref 0.61–1.24)
GFR calc Af Amer: >60 mL/min (ref >60)
GFR calc non Af Amer: >60 mL/min (ref >60)
Glucose, Bld: 89 mg/dL (ref 70–99)
Potassium: 3.7 mmol/L (ref 3.5–5.1)
Sodium: 139 mmol/L (ref 135–145)
Total Bilirubin: 0.5 mg/dL (ref 0.3–1.2)
Total Protein: 7.2 g/dL (ref 6.5–8.1)

## 2020-04-17 LAB — TROPONIN I (HIGH SENSITIVITY): Troponin I (High Sensitivity): 25 ng/L — ABNORMAL HIGH (ref <18)

## 2020-04-17 LAB — URINALYSIS, COMPLETE (UACMP) WITH MICROSCOPIC
Bacteria, UA: NONE SEEN
Bilirubin Urine: NEGATIVE
Glucose, UA: NEGATIVE mg/dL
Ketones, ur: NEGATIVE mg/dL
Leukocytes,Ua: NEGATIVE
Nitrite: NEGATIVE
Protein, ur: NEGATIVE mg/dL
Specific Gravity, Urine: 1.009 (ref 1.005–1.030)
Squamous Epithelial / HPF: NONE SEEN (ref 0–5)
pH: 5 (ref 5.0–8.0)

## 2020-04-17 LAB — GLUCOSE, CAPILLARY: Glucose-Capillary: 92 mg/dL (ref 70–99)

## 2020-04-17 LAB — ETHANOL: Alcohol, Ethyl (B): 19 mg/dL — ABNORMAL HIGH (ref <10)

## 2020-04-17 NOTE — ED Triage Notes (Signed)
Pt to triage via w/c with no distress noted, mask in place; pt reports his friend told him he had a seizure PTA and was incont upon awakening, st last seizure yr ago; st has never been seen for seizures and takes no medications for although his hx st he has been dx with such; c/o dizziness at present; +ETOH

## 2020-04-18 ENCOUNTER — Emergency Department
Admission: EM | Admit: 2020-04-18 | Discharge: 2020-04-18 | Disposition: A | Discharge status: Home / Self Care | Payer: Medicare HMO | Attending: Emergency Medicine | Admitting: Emergency Medicine

## 2020-04-18 DIAGNOSIS — F1910 Other psychoactive substance abuse, uncomplicated: Secondary | ICD-10-CM | POA: Diagnosis present

## 2020-04-18 DIAGNOSIS — F1490 Cocaine use, unspecified, uncomplicated: Secondary | ICD-10-CM

## 2020-04-18 DIAGNOSIS — F1290 Cannabis use, unspecified, uncomplicated: Secondary | ICD-10-CM

## 2020-04-18 DIAGNOSIS — R569 Unspecified convulsions: Secondary | ICD-10-CM

## 2020-04-18 DIAGNOSIS — C61 Malignant neoplasm of prostate: Secondary | ICD-10-CM | POA: Diagnosis present

## 2020-04-18 LAB — TROPONIN I (HIGH SENSITIVITY): Troponin I (High Sensitivity): 23 ng/L — ABNORMAL HIGH (ref <18)

## 2020-04-18 MED ORDER — THIAMINE HCL 100 MG/ML IJ SOLN
Freq: Once | INTRAVENOUS | Status: AC
  Filled 2020-04-18: qty 1000

## 2020-04-18 NOTE — ED Notes (Signed)
Pt up to bathroom with steady gait, no distress noted.

## 2020-04-18 NOTE — BH Assessment (Addendum)
Assessment Note  Alexander Avery is an 64 y.o. African-American male who presents to the ED with a recent seizure, incontinence and feelings of wanting to hurt himself.    Writer assessed patient and patient reported "I came to the hospital because I was feeling like hurting myself". Patient endorsed a diagnosis of prostate cancer to which he reported that this is what is causing him distress. Patient reports that he was told by his friend yesterday that he had a seizure and also became incontinent after his seizure. Patient reported that he has not had a seizure in over a year and has never had treatment for seizures. Patient reports he is not receiving outpatient therapy or psych medication management. Patient indicated that he is currently living with a few family members and denied SI/HI/AH/VH.   Writer was able to look at UDS and patient is positive for cocaine and marijuana. It is appropriate for patient to continue to be observed and reassessed on day shift as his disposition may be interfered by his drug use. Patient should be given resources for SA/MH treatment upon discharge.   This case was staffed with Kennyth Lose, NP and Beather Arbour, MD. Patient is appropriate to be reassessed on day shift.     Diagnosis: Polysubstance Use D/O  Past Medical History:  Past Medical History:  Diagnosis Date  . Anxiety   . Arthritis   . Cancer [facility])    prostate  . Cataract 2019  . Diabetes mellitus without complication ([place])   . GERD (gastroesophageal reflux disease)   . Hernia of abdominal wall 2015  . History of stomach ulcers   . Hypertension   . Prostate cancer ([place])   . Seizures ([place])   . Sleep apnea     Past Surgical History:  Procedure Laterality Date  . CATARACT EXTRACTION W/PHACO Right 03/30/2018   Procedure: CATARACT EXTRACTION PHACO AND INTRAOCULAR LENS PLACEMENT (IOC);  Surgeon: Eulogio Bear, MD;  Location: [facility] ORS;  Service: Ophthalmology;  Laterality: Right;  [place] 01:09.5 AP%  10.4 CDE 7.98 Fluid pack lot # [id number]  . CATARACT EXTRACTION W/PHACO Left 06/29/2018   Procedure: CATARACT EXTRACTION PHACO AND INTRAOCULAR LENS PLACEMENT (IOC);  Surgeon: Eulogio Bear, MD;  Location: [facility] ORS;  Service: Ophthalmology;  Laterality: Left;  [place] 01:27.7 AP% 8.2 CDE 7.27 Fluid pack lot # [id number]  . EYE SURGERY    . HERNIA REPAIR    . PROSTATE SURGERY    . PROSTATE SURGERY  2016    Family History:  Family History  Problem Relation Age of Onset  . Cancer Mother     Social History:  reports that he has been smoking. He has been smoking about 2.00 packs per day. He has never used smokeless tobacco. He reports current alcohol use of about 2.0 standard drinks of alcohol per week. He reports that he does not use drugs.  Additional Social History:  Alcohol / Drug Use Pain Medications: See PTA Prescriptions: See PTA Over the Counter: See PTA History of alcohol / drug use?: No history of alcohol / drug abuse  CIWA: CIWA-Ar BP: (!) 151/77 Pulse Rate: (!) 53 COWS:    Allergies:  Allergies  Allergen Reactions  . Penicillins Hives and Other (See Comments)    Urinate on self, pass out Has patient had a PCN reaction causing immediate rash, facial/tongue/throat swelling, SOB or lightheadedness with hypotension: Yes Has patient had a PCN reaction causing severe rash involving mucus membranes or skin necrosis: No Has patient had  a PCN reaction that required hospitalization: Yes- In hospital Has patient had a PCN reaction occurring within the last 10 years: Yes If all of the above answers are "NO", then may proceed with Cephalosporin use.     Home Medications: (Not in a hospital admission)   OB/GYN Status:  No LMP for male patient.  General Assessment Data Location of Assessment: [facility] ED TTS Assessment: In system Is this a Tele or Face-to-Face Assessment?: Face-to-Face Is this an Initial Assessment or a Re-assessment for this encounter?: Initial  Assessment Patient Accompanied by:: N/A Language Other than English: No Living Arrangements: (private home) What gender do you identify as?: Male Date Telepsych consult ordered in [facility]: ([id number]) Marital status: Single Living Arrangements: Other relatives Can pt return to current living arrangement?: Yes Admission Status: Voluntary Is patient capable of signing voluntary admission?: Yes Referral Source: Self/Family/Friend Insurance type: (Medicare)  Medical Screening Exam ([place]) Medical Exam completed: Yes  Crisis Care Plan Living Arrangements: Other relatives Legal Guardian: Other:(self) Name of Psychiatrist: (none noted) Name of Therapist: (none noted)  Education Status Is patient currently in school?: No Is the patient employed, unemployed or receiving disability?: Receiving disability income  Risk to self with the past 6 months Suicidal Ideation: No Has patient been a risk to self within the past 6 months prior to admission? : No Suicidal Intent: No Has patient had any suicidal intent within the past 6 months prior to admission? : No Is patient at risk for suicide?: No Suicidal Plan?: No Has patient had any suicidal plan within the past 6 months prior to admission? : No Access to Means: No What has been your use of drugs/alcohol within the last 12 months?: (cocaine and marijuana) Previous Attempts/Gestures: No Triggers for Past Attempts: Unknown Intentional Self Injurious Behavior: None Family Suicide History: Unable to assess Recent stressful life event(s): Recent negative physical changes(Prostate cancer) Persecutory voices/beliefs?: No Depression: Yes Depression Symptoms: Feeling angry/irritable Substance abuse history and/or treatment for substance abuse?: Yes Suicide prevention information given to non-admitted patients: Not applicable  Risk to Others within the past 6 months Homicidal Ideation: No Does patient have any lifetime risk of violence  toward others beyond the six months prior to admission? : No Thoughts of Harm to Others: No Current Homicidal Intent: No Current Homicidal Plan: No Access to Homicidal Means: No History of harm to others?: No Assessment of Violence: None Noted Does patient have access to weapons?: No Criminal Charges Pending?: No Does patient have a court date: No Is patient on probation?: No  Psychosis Hallucinations: None noted Delusions: None noted  Mental Status Report Appearance/Hygiene: In scrubs Eye Contact: Fair Motor Activity: Unable to assess Speech: Logical/coherent Level of Consciousness: Quiet/awake  Cognitive Functioning Concentration: Normal Memory: Recent Intact, Remote Intact Is patient IDD: No Insight: Fair Impulse Control: Fair Appetite: Fair Have you had any weight changes? : No Change Sleep: Unable to Assess Total Hours of Sleep: (patient doesnt recall how many hours) Vegetative Symptoms: Unable to Assess  ADLScreening [facility] Assessment Services) Patient's cognitive ability adequate to safely complete daily activities?: Yes Patient able to express need for assistance with ADLs?: No Independently performs ADLs?: Yes (appropriate for developmental age)  Prior Inpatient Therapy Prior Inpatient Therapy: No  Prior Outpatient Therapy Prior Outpatient Therapy: No Does patient have an ACCT team?: No Does patient have Intensive In-House Services?  : No Does patient have [facility] services? : No Does patient have P4CC services?: No  ADL Screening (condition at time of admission)  Patient's cognitive ability adequate to safely complete daily activities?: Yes Is the patient deaf or have difficulty hearing?: No Does the patient have difficulty seeing, even when wearing glasses/contacts?: No Does the patient have difficulty concentrating, remembering, or making decisions?: No Patient able to express need for assistance with ADLs?: No Does the patient have difficulty dressing  or bathing?: No Independently performs ADLs?: Yes (appropriate for developmental age) Does the patient have difficulty walking or climbing stairs?: No Weakness of Legs: None Weakness of Arms/Hands: None  Home Assistive Devices/Equipment Home Assistive Devices/Equipment: None  Therapy Consults (therapy consults require a physician order) PT Evaluation Needed: No OT Evalulation Needed: No SLP Evaluation Needed: No Abuse/Neglect Assessment (Assessment to be complete while patient is alone) Abuse/Neglect Assessment Can Be Completed: Unable to assess, patient is non-responsive or altered mental status Values / Beliefs Cultural Requests During Hospitalization: None Spiritual Requests During Hospitalization: None Consults Spiritual Care Consult Needed: No Transition of Care Team Consult Needed: No Advance Directives (For Healthcare) Does Patient Have a Medical Advance Directive?: No Would patient like information on creating a medical advance directive?: No - Patient declined          Disposition:  Disposition Initial Assessment Completed for this Encounter: Yes Patient referred to: Other (Comment)(Patient to be reassessed)  On Site Evaluation by:   Reviewed with Physician:    Jane Canary, MSc., [facility], [facility] 04/18/2020 5:05 AM

## 2020-04-18 NOTE — ED Notes (Signed)
VOL/PENDING PSYCH CONSULT

## 2020-04-18 NOTE — ED Notes (Signed)
Pt discharged home. VS stable. All belongings returned to patient. Pt denies SI/HI. Pt signed for discharge. Discharge instructions given to pt.

## 2020-04-18 NOTE — ED Notes (Signed)
Pt belongings by Arby Barrette RN include:   1 red pair glasses 1 white shirt 1 blue cell phone 1 black belt 1 jean pant 1 blue/black coat 1 gray sweatshirt 1 gray hat 1 black wallet 2 white shoes 2 white socks   All placed inside personal belongings bag and labeled with pt information.

## 2020-04-18 NOTE — ED Notes (Signed)
Pt walked to bathroom down the hall by this tech, pt needed no assistance in walking or using restroom

## 2020-04-18 NOTE — ED Notes (Signed)
PT changed into hospital burgundy scrubs with Butch RN and this RN. Belongings secured include: Cellphone socks shoes Blue paper scrubs Hat

## 2020-04-18 NOTE — ED Provider Notes (Signed)
-----------------------------------------   10:54 AM on 04/18/2020 -----------------------------------------  This patient was pending repeat psychiatry evaluation.  He asked to speak to me, and now states he would like to leave.  He states that he is not feeling suicidal, and reports that he was just saying that because he did not have any place to stay last night and wanted to be able to sleep in the hospital.  He denies any SI or HI at this time.  He states he has never tried to harm himself in the past.  He is able to contract for safety.  At this time, the patient is comfortable appearing.  His vital signs are normal except for mild hypertension.  He is neurologically intact and ambulating without difficulty.  There is no evidence of acute psychiatric issue and I do not feel he has any indication for emergent psychiatric evaluation.  He does not demonstrate any danger to self or others.  He was voluntary, so at this time I have canceled the psychiatry consultation and will discharge the patient per his wishes.   Arta Silence, MD 04/18/20 1056

## 2020-04-18 NOTE — ED Notes (Signed)
Pt requesting to be discharged. EDP spoke with patient and he will be released.

## 2020-04-18 NOTE — ED Provider Notes (Signed)
[facility] Emergency Department Provider Note   ____________________________________________   First MD Initiated Contact with Patient 04/18/20 0153     (approximate)  I have reviewed the triage vital signs and the nursing notes.   HISTORY  Chief Complaint Seizures    HPI Alexander Avery is a 64 y.o. male brought to the ED by a friend with a chief complaint of seizure.  Patient states his friend told him he had a seizure prior to arrival.  Patient was incontinent of urine upon wakening.  Denies seizure disorder but states he had a seizure last year.  Does not take any medications for his seizures.  Denies fever, cough, chest pain, shortness of breath, abdominal pain, nausea or vomiting.  Admits to EtOH use and some dizziness.  Denies trauma/fall/injury.       Past Medical History:  Diagnosis Date  . Anxiety   . Arthritis   . Cancer [facility])    prostate  . Cataract 2019  . Diabetes mellitus without complication ([place])   . GERD (gastroesophageal reflux disease)   . Hernia of abdominal wall 2015  . History of stomach ulcers   . Hypertension   . Prostate cancer ([place])   . Seizures ([place])   . Sleep apnea     Patient Active Problem List   Diagnosis Date Noted  . Polysubstance abuse ([place]) 04/18/2020  . Prostate cancer ([place]) 02/20/2019    Past Surgical History:  Procedure Laterality Date  . CATARACT EXTRACTION W/PHACO Right 03/30/2018   Procedure: CATARACT EXTRACTION PHACO AND INTRAOCULAR LENS PLACEMENT (IOC);  Surgeon: Eulogio Bear, MD;  Location: [facility] ORS;  Service: Ophthalmology;  Laterality: Right;  [place] 01:09.5 AP% 10.4 CDE 7.98 Fluid pack lot # [id number]  . CATARACT EXTRACTION W/PHACO Left 06/29/2018   Procedure: CATARACT EXTRACTION PHACO AND INTRAOCULAR LENS PLACEMENT (IOC);  Surgeon: Eulogio Bear, MD;  Location: [facility] ORS;  Service: Ophthalmology;  Laterality: Left;  [place] 01:27.7 AP% 8.2 CDE 7.27 Fluid pack lot # [id number]  . EYE  SURGERY    . HERNIA REPAIR    . PROSTATE SURGERY    . PROSTATE SURGERY  2016    Prior to Admission medications   Medication Sig Start Date End Date Taking? Authorizing Provider  acetaminophen (TYLENOL) 325 MG tablet Take 650 mg by mouth every 6 (six) hours as needed for moderate pain or headache.    [provider]  atorvastatin (LIPITOR) 40 MG tablet Take 40 mg by mouth at bedtime. 05/22/18   [provider]  canagliflozin (INVOKANA) 300 MG TABS tablet Take 300 mg by mouth daily before breakfast.    [provider]  Difluprednate (DUREZOL) 0.05 % EMUL Place 1 drop into the right eye 2 (two) times daily.     [provider]  hydrocortisone 2.5 % cream Apply topically 2 (two) times daily. 04/26/19   Chrystal, Eulas Post, MD  JARDIANCE 10 MG TABS tablet TAKE 1 TABLET BY MOUTH IN THE MORNING FOR DIABETES 02/08/19   [provider]  losartan-hydrochlorothiazide (HYZAAR) 100-25 MG tablet Take 1 tablet by mouth daily.    [provider]  meclizine (ANTIVERT) 12.5 MG tablet Take 1 tablet (12.5 mg total) by mouth 2 (two) times daily as needed for dizziness. 06/28/18   Merlyn Lot, MD  metFORMIN (GLUCOPHAGE) 500 MG tablet Take 500 mg by mouth 2 (two) times daily with a meal.    [provider]  oxybutynin (DITROPAN-XL) 10 MG 24 hr tablet Take 1  tablet (10 mg total) by mouth daily. 11/22/19   [name] A, PA-C  sildenafil (REVATIO) 20 MG tablet Take 3 to 5 tablets two hours before intercouse on an empty stomach.  Do not take with nitrates. 12/22/18   [name] A, PA-C  sildenafil (VIAGRA) 100 MG tablet Take 1 tablet (100 mg total) by mouth daily as needed for erectile dysfunction. Take two hours prior to intercourse on an empty stomach 01/31/20   [name] A, PA-C    Allergies Penicillins  Family History  Problem Relation Age of Onset  . Cancer Mother     Social History Social History   Tobacco Use  . Smoking status:  Current Some Day Smoker    Packs/day: 2.00  . Smokeless tobacco: Never Used  Substance Use Topics  . Alcohol use: Yes    Alcohol/week: 2.0 standard drinks    Types: 2 Cans of beer per week    Comment: 2 beers twice a week  . Drug use: No    Review of Systems  Constitutional: No fever/chills Eyes: No visual changes. ENT: No sore throat. Cardiovascular: Denies chest pain. Respiratory: Denies shortness of breath. Gastrointestinal: No abdominal pain.  No nausea, no vomiting.  No diarrhea.  No constipation. Genitourinary: Negative for dysuria. Musculoskeletal: Negative for back pain. Skin: Negative for rash. Neurological: Positive for seizure.  Negative for headaches, focal weakness or numbness.   ____________________________________________   PHYSICAL EXAM:  VITAL SIGNS: ED Triage Vitals  Enc Vitals Group     BP 04/17/20 2228 (!) 157/90     Pulse Rate 04/17/20 2228 82     Resp 04/17/20 2228 20     Temp 04/17/20 2228 98.2 F (36.8 C)     Temp Source 04/17/20 2228 Oral     SpO2 04/17/20 2228 100 %     Weight 04/17/20 2137 133 lb (60.3 kg)     Height 04/17/20 2137 5\' 6"  (1.676 m)     Head Circumference --      Peak Flow --      Pain Score 04/17/20 2137 0     Pain Loc --      Pain Edu? --      Excl. in [place]? --     Constitutional: Alert and oriented.  Cachectic appearing and in no acute distress. Eyes: Conjunctivae are normal. PERRL. EOMI. Head: Atraumatic. Nose: Atraumatic. Mouth/Throat: Mucous membranes are moist.  Did not bite tongue.  No dental malocclusion. Neck: No stridor.  No cervical spine tenderness to palpation. Cardiovascular: Normal rate, regular rhythm. Grossly normal heart sounds.  Good peripheral circulation. Respiratory: Normal respiratory effort.  No retractions. Lungs CTAB. Gastrointestinal: Soft and nontender to light or deep palpation. No distention. No abdominal bruits. No CVA tenderness. Musculoskeletal: No lower extremity tenderness nor edema.   No joint effusions. Neurologic:  Normal speech and language. No gross focal neurologic deficits are appreciated.  Skin:  Skin is warm, dry and intact. No rash noted. Psychiatric: Mood and affect are normal. Speech and behavior are normal.  ____________________________________________   LABS (all labs ordered are listed, but only abnormal results are displayed)  Labs Reviewed  CBC WITH DIFFERENTIAL/PLATELET - Abnormal; Notable for the following components:      Result Value   HCT 38.2 (*)    MCHC 36.6 (*)    All other components within normal limits  ETHANOL - Abnormal; Notable for the following components:   Alcohol, Ethyl (B) 19 (*)    All other components within  normal limits  URINALYSIS, COMPLETE (UACMP) WITH MICROSCOPIC - Abnormal; Notable for the following components:   Color, Urine YELLOW (*)    APPearance CLEAR (*)    Hgb urine dipstick SMALL (*)    All other components within normal limits  URINE DRUG SCREEN, QUALITATIVE ([facility] ONLY) - Abnormal; Notable for the following components:   Cocaine Metabolite,Ur [place] POSITIVE (*)    Cannabinoid 50 Ng, Ur  POSITIVE (*)    All other components within normal limits  TROPONIN I (HIGH SENSITIVITY) - Abnormal; Notable for the following components:   Troponin I (High Sensitivity) 25 (*)    All other components within normal limits  TROPONIN I (HIGH SENSITIVITY) - Abnormal; Notable for the following components:   Troponin I (High Sensitivity) 23 (*)    All other components within normal limits  COMPREHENSIVE METABOLIC PANEL  GLUCOSE, CAPILLARY  CBG MONITORING, ED   ____________________________________________  EKG  ED ECG REPORT I, Sabriel Borromeo J, the attending physician, personally viewed and interpreted this ECG.   Date: 04/18/2020  EKG Time: 2135  Rate: 74  Rhythm: normal EKG, normal sinus rhythm  Axis: Normal  Intervals:none  ST&T Change: Nonspecific  ____________________________________________  RADIOLOGY  ED MD  interpretation: No ICH  Official radiology report(s): CT Head Wo Contrast  Result Date: 04/17/2020 CLINICAL DATA:  Seizure, dizziness, intoxicated EXAM: CT HEAD WITHOUT CONTRAST TECHNIQUE: Contiguous axial images were obtained from the base of the skull through the vertex without intravenous contrast. COMPARISON:  06/28/2018 FINDINGS: Brain: No acute infarct or hemorrhage. Lateral ventricles and midline structures are stable. No acute extra-axial fluid collections. No mass effect. Vascular: No hyperdense vessel or unexpected calcification. Skull: Normal. Negative for fracture or focal lesion. Sinuses/Orbits: No acute finding. Other: None. IMPRESSION: 1. No acute intracranial process. Electronically Signed   By: Randa Ngo M.D.   On: 04/17/2020 23:16    ____________________________________________   PROCEDURES  Procedure(s) performed (including Critical Care):  Procedures   ____________________________________________   INITIAL IMPRESSION / ASSESSMENT AND PLAN / ED COURSE  As part of my medical decision making, I reviewed the following data within the [place] notes reviewed and incorporated, Labs reviewed, EKG interpreted, Old chart reviewed, Radiograph reviewed and Notes from prior ED visits     Alexander Avery was evaluated in Emergency Department on 04/18/2020 for the symptoms described in the history of present illness. He was evaluated in the context of the global COVID-19 pandemic, which necessitated consideration that the patient might be at risk for infection with the SARS-CoV-2 virus that causes COVID-19. Institutional protocols and algorithms that pertain to the evaluation of patients at risk for COVID-19 are in a state of rapid change based on information released by regulatory bodies including the CDC and federal and state organizations. These policies and algorithms were followed during the patient's care in the ED.    64 year old male  presenting with seizure activity.  Differential diagnosis includes but is not limited to [place], infectious, metabolic, toxicological etiologies, etc.  Laboratory results remarkable for EtOH, cocaine metabolites and cannabinoids.  Initial troponin 25.  Will repeat troponin, administer banana bag and reassess.   Clinical Course as of Apr 18 554  Fri Apr 18, 2020  0341 Troponin downtrending.  Encourage patient to refrain from alcohol, cocaine and marijuana as these substances may lower seizure threshold.  Will refer to neurology for follow-up.  On discharge now patient is requesting to be admitted to the psychiatry service.  When queried why, he  mentions that he has no place to stay.  He just has an address he uses.  Upon further questioning, now patient is stating he wants to hurt himself.  Will dress patient out and consult psychiatry.   [JS]  [id number] Patient evaluated by psychiatry who will reassess later this morning.   [JS]    Clinical Course User Index [JS] Paulette Blanch, MD     ____________________________________________   FINAL CLINICAL IMPRESSION(S) / ED DIAGNOSES  Final diagnoses:  Seizure ([place])  Cocaine use  Marijuana use     ED Discharge Orders    None       Note:  This document was prepared using Dragon voice recognition software and may include unintentional dictation errors.   Paulette Blanch, MD 04/18/20 [id number]

## 2020-04-18 NOTE — ED Notes (Addendum)
Pt states he is here for "a seizure and his prostate is acting up". Pt also states he is bleeding "from (his) backside".  NAD noted, pt comfortable, no further needs at this time.

## 2020-04-18 NOTE — ED Notes (Signed)
Writer has dropped off referral resources for outpatient mental health treatment for [place] and [facility] with patients nurse.

## 2020-04-18 NOTE — Consult Note (Signed)
[facility] Face-to-Face Psychiatry Consult   Reason for Consult: Seizures Referring Physician: Dr. Beather Arbour Patient Identification: Alexander Avery MRN:  [id number] Principal Diagnosis: <principal problem not specified> Diagnosis:  Active Problems:   Prostate cancer ([place])   Polysubstance abuse ([place])   Total Time spent with patient: 20 minutes  Subjective: "I have prostate cancer and I had a seizure today." Alexander Avery is a 64 y.o. male patient presented to [facility] ED via POV voluntary.  The patient voice he came in tonight due to him having a seizure. He states his last seizure was a year ago.  He voiced his last seizure was a year ago, and he has not been prescribed medication for his seizure.  The patient's UDS is positive for cocaine and cannabinoid.  The patient also has prostate cancer which he voiced has him depressed due to his illness.  The patient was seen face-to-face by this provider; the chart was reviewed and consulted with Dr. Beather Arbour on 04/18/2020 due to the patient's care. It was discussed with the EDP that the patient would remain under observation overnight and reassess in the a.m. to determine if he meets the criteria for psychiatric inpatient admission or could be discharged home.  The patient currently does not have an outpatient psychiatric provider or therapist, and he could benefit from outpatient services. On evaluation, the patient is alert and oriented x 4, calm, depressed but cooperative, and mood-congruent with affect. The patient does not appear to be responding to internal or external stimuli. Neither is the patient presenting with any delusional thinking. The patient denies auditory or visual hallucinations. The patient admits to suicidal ideation without a plan but denies homicidal ideations. The patient is not presenting with any psychotic or paranoid behaviors. During an encounter with the patient, he was able to answer most questions appropriately.  Plan:  the patient will  remain under observation overnight and reassess in the a.m. to determine if he meets the criteria for psychiatric inpatient admission or could be discharged home.  The patient currently does not have an outpatient psychiatric provider or therapist, and he could benefit from outpatient services. HPI: Per Dr. Beather Arbour: Alexander Avery is a 64 y.o. male brought to the ED by a friend with a chief complaint of seizure.  Patient states his friend told him he had a seizure prior to arrival.  Patient was incontinent of urine upon wakening.  Denies seizure disorder but states he had a seizure last year.  Does not take any medications for his seizures.  Denies fever, cough, chest pain, shortness of breath, abdominal pain, nausea or vomiting.  Admits to EtOH use and some dizziness.  Denies trauma/fall/injury.  Past Psychiatric History:  Anxiety Seizures ([place]) Sleep apnea  Risk to Self:   Yes Risk to Others:   No Prior Inpatient Therapy:   Unknown Prior Outpatient Therapy:   No  Past Medical History:  Past Medical History:  Diagnosis Date  . Anxiety   . Arthritis   . Cancer [facility])    prostate  . Cataract 2019  . Diabetes mellitus without complication ([place])   . GERD (gastroesophageal reflux disease)   . Hernia of abdominal wall 2015  . History of stomach ulcers   . Hypertension   . Prostate cancer ([place])   . Seizures ([place])   . Sleep apnea     Past Surgical History:  Procedure Laterality Date  . CATARACT EXTRACTION W/PHACO Right 03/30/2018   Procedure: CATARACT EXTRACTION PHACO AND INTRAOCULAR LENS  PLACEMENT (IOC);  Surgeon: Eulogio Bear, MD;  Location: [facility] ORS;  Service: Ophthalmology;  Laterality: Right;  [place] 01:09.5 AP% 10.4 CDE 7.98 Fluid pack lot # [id number]  . CATARACT EXTRACTION W/PHACO Left 06/29/2018   Procedure: CATARACT EXTRACTION PHACO AND INTRAOCULAR LENS PLACEMENT (IOC);  Surgeon: Eulogio Bear, MD;  Location: [facility] ORS;  Service: Ophthalmology;  Laterality: Left;  [place]  01:27.7 AP% 8.2 CDE 7.27 Fluid pack lot # [id number]  . EYE SURGERY    . HERNIA REPAIR    . PROSTATE SURGERY    . PROSTATE SURGERY  2016   Family History:  Family History  Problem Relation Age of Onset  . Cancer Mother    Family Psychiatric  History: Social History:  Social History   Substance and Sexual Activity  Alcohol Use Yes  . Alcohol/week: 2.0 standard drinks  . Types: 2 Cans of beer per week   Comment: 2 beers twice a week     Social History   Substance and Sexual Activity  Drug Use No    Social History   Socioeconomic History  . Marital status: Single    Spouse name: Not on file  . Number of children: Not on file  . Years of education: Not on file  . Highest education level: Not on file  Occupational History  . Not on file  Tobacco Use  . Smoking status: Current Some Day Smoker    Packs/day: 2.00  . Smokeless tobacco: Never Used  Substance and Sexual Activity  . Alcohol use: Yes    Alcohol/week: 2.0 standard drinks    Types: 2 Cans of beer per week    Comment: 2 beers twice a week  . Drug use: No  . Sexual activity: Not on file  Other Topics Concern  . Not on file  Social History Narrative  . Not on file   Social Determinants of Health   Financial Resource Strain:   . Difficulty of Paying Living Expenses:   Food Insecurity:   . Worried About Charity fundraiser in the Last Year:   . Arboriculturist in the Last Year:   Transportation Needs:   . Film/video editor (Medical):   Marland Kitchen Lack of Transportation (Non-Medical):   Physical Activity:   . Days of Exercise per Week:   . Minutes of Exercise per Session:   Stress:   . Feeling of Stress :   Social Connections:   . Frequency of Communication with Friends and Family:   . Frequency of Social Gatherings with Friends and Family:   . Attends Religious Services:   . Active Member of Clubs or Organizations:   . Attends Archivist Meetings:   Marland Kitchen Marital Status:    Additional Social  History:    Allergies:   Allergies  Allergen Reactions  . Penicillins Hives and Other (See Comments)    Urinate on self, pass out Has patient had a PCN reaction causing immediate rash, facial/tongue/throat swelling, SOB or lightheadedness with hypotension: Yes Has patient had a PCN reaction causing severe rash involving mucus membranes or skin necrosis: No Has patient had a PCN reaction that required hospitalization: Yes- In hospital Has patient had a PCN reaction occurring within the last 10 years: Yes If all of the above answers are "NO", then may proceed with Cephalosporin use.     Labs:  Results for orders placed or performed during the hospital encounter of 04/18/20 (from the past 48 hour(s))  CBC with  Differential     Status: Abnormal   Collection Time: 04/17/20  9:43 PM  Result Value Ref Range   WBC 5.1 4.0 - 10.5 K/uL   RBC 4.71 4.22 - 5.81 MIL/uL   Hemoglobin 14.0 13.0 - 17.0 g/dL   HCT 38.2 (L) 39.0 - 52.0 %   MCV 81.1 80.0 - 100.0 fL   MCH 29.7 26.0 - 34.0 pg   MCHC 36.6 (H) 30.0 - 36.0 g/dL   RDW 13.3 11.5 - 15.5 %   Platelets 164 150 - 400 K/uL   nRBC 0.0 0.0 - 0.2 %   Neutrophils Relative % 59 %   Neutro Abs 3.0 1.7 - 7.7 K/uL   Lymphocytes Relative 26 %   Lymphs Abs 1.3 0.7 - 4.0 K/uL   Monocytes Relative 11 %   Monocytes Absolute 0.6 0.1 - 1.0 K/uL   Eosinophils Relative 3 %   Eosinophils Absolute 0.1 0.0 - 0.5 K/uL   Basophils Relative 1 %   Basophils Absolute 0.0 0.0 - 0.1 K/uL   Immature Granulocytes 0 %   Abs Immature Granulocytes 0.01 0.00 - 0.07 K/uL    Comment: Performed at [facility], [address] [place], [place] [address]  Comprehensive metabolic panel     Status: None   Collection Time: 04/17/20  9:43 PM  Result Value Ref Range   Sodium 139 135 - 145 mmol/L   Potassium 3.7 3.5 - 5.1 mmol/L   Chloride 101 98 - 111 mmol/L   CO2 27 22 - 32 mmol/L   Glucose, Bld 89 70 - 99 mg/dL    Comment: Glucose reference range applies only  to samples taken after fasting for at least 8 hours.   BUN 12 8 - 23 mg/dL   Creatinine, Ser 1.14 0.61 - 1.24 mg/dL   Calcium 9.2 8.9 - 10.3 mg/dL   Total Protein 7.2 6.5 - 8.1 g/dL   Albumin 3.7 3.5 - 5.0 g/dL   AST 28 15 - 41 U/L   ALT 19 0 - 44 U/L   Alkaline Phosphatase 62 38 - 126 U/L   Total Bilirubin 0.5 0.3 - 1.2 mg/dL   GFR calc non Af Amer >60 >60 mL/min   GFR calc Af Amer >60 >60 mL/min   Anion gap 11 5 - 15    Comment: Performed at [facility], [address], [place], [place] [address]  Ethanol     Status: Abnormal   Collection Time: 04/17/20  9:43 PM  Result Value Ref Range   Alcohol, Ethyl (B) 19 (H) <10 mg/dL    Comment: (NOTE) Lowest detectable limit for serum alcohol is 10 mg/dL. For medical purposes only. Performed at [facility], [address] [place], [place] [address]   Urinalysis, Complete w Microscopic     Status: Abnormal   Collection Time: 04/17/20  9:43 PM  Result Value Ref Range   Color, Urine YELLOW (A) YELLOW   APPearance CLEAR (A) CLEAR   Specific Gravity, Urine 1.009 1.005 - 1.030   pH 5.0 5.0 - 8.0   Glucose, UA NEGATIVE NEGATIVE mg/dL   Hgb urine dipstick SMALL (A) NEGATIVE   Bilirubin Urine NEGATIVE NEGATIVE   Ketones, ur NEGATIVE NEGATIVE mg/dL   Protein, ur NEGATIVE NEGATIVE mg/dL   Nitrite NEGATIVE NEGATIVE   Leukocytes,Ua NEGATIVE NEGATIVE   WBC, UA 0-5 0 - 5 WBC/hpf   Bacteria, UA NONE SEEN NONE SEEN   Squamous Epithelial / LPF NONE SEEN 0 - 5  Comment: Performed at [facility], [address] [place], [place] [address]  Urine Drug Screen, Qualitative [facility] only)     Status: Abnormal   Collection Time: 04/17/20  9:43 PM  Result Value Ref Range   Tricyclic, Ur Screen NONE DETECTED NONE DETECTED   Amphetamines, Ur Screen NONE DETECTED NONE DETECTED   MDMA (Ecstasy)Ur Screen NONE DETECTED NONE DETECTED   Cocaine Metabolite,Ur [place] POSITIVE (A) NONE DETECTED   Opiate, Ur Screen NONE DETECTED NONE  DETECTED   Phencyclidine (PCP) Ur S NONE DETECTED NONE DETECTED   Cannabinoid 50 Ng, Ur [place] POSITIVE (A) NONE DETECTED   Barbiturates, Ur Screen NONE DETECTED NONE DETECTED   Benzodiazepine, Ur Scrn NONE DETECTED NONE DETECTED   Methadone Scn, Ur NONE DETECTED NONE DETECTED    Comment: (NOTE) Tricyclics + metabolites, urine    Cutoff 1000 ng/mL Amphetamines + metabolites, urine  Cutoff 1000 ng/mL MDMA (Ecstasy), urine              Cutoff 500 ng/mL Cocaine Metabolite, urine          Cutoff 300 ng/mL Opiate + metabolites, urine        Cutoff 300 ng/mL Phencyclidine (PCP), urine         Cutoff 25 ng/mL Cannabinoid, urine                 Cutoff 50 ng/mL Barbiturates + metabolites, urine  Cutoff 200 ng/mL Benzodiazepine, urine              Cutoff 200 ng/mL Methadone, urine                   Cutoff 300 ng/mL The urine drug screen provides only a preliminary, unconfirmed analytical test result and should not be used for non-medical purposes. Clinical consideration and professional judgment should be applied to any positive drug screen result due to possible interfering substances. A more specific alternate chemical method must be used in order to obtain a confirmed analytical result. Gas chromatography / mass spectrometry (GC/MS) is the preferred confirmat ory method. Performed at [facility], [place], [place] [address]   Troponin I (High Sensitivity)     Status: Abnormal   Collection Time: 04/17/20  9:43 PM  Result Value Ref Range   Troponin I (High Sensitivity) 25 (H) <18 ng/L    Comment: (NOTE) Elevated high sensitivity troponin I (hsTnI) values and significant  changes across serial measurements may suggest ACS but many other  chronic and acute conditions are known to elevate hsTnI results.  Refer to the "Links" section for chest pain algorithms and additional  guidance. Performed at [facility], [address] [place], [place] [address]    Glucose, capillary     Status: None   Collection Time: 04/17/20  9:48 PM  Result Value Ref Range   Glucose-Capillary 92 70 - 99 mg/dL    Comment: Glucose reference range applies only to samples taken after fasting for at least 8 hours.   Comment 1 Notify RN    Comment 2 Document in Chart   Troponin I (High Sensitivity)     Status: Abnormal   Collection Time: 04/18/20  2:26 AM  Result Value Ref Range   Troponin I (High Sensitivity) 23 (H) <18 ng/L    Comment: (NOTE) Elevated high sensitivity troponin I (hsTnI) values and significant  changes across serial measurements may suggest ACS but many other  chronic and acute conditions are known to elevate hsTnI  results.  Refer to the "Links" section for chest pain algorithms and additional  guidance. Performed at [facility], [address] [place], [place] [address]     No current facility-administered medications for this encounter.   Current Outpatient Medications  Medication Sig Dispense Refill  . acetaminophen (TYLENOL) 325 MG tablet Take 650 mg by mouth every 6 (six) hours as needed for moderate pain or headache.    Marland Kitchen atorvastatin (LIPITOR) 40 MG tablet Take 40 mg by mouth at bedtime.  4  . canagliflozin (INVOKANA) 300 MG TABS tablet Take 300 mg by mouth daily before breakfast.    . Difluprednate (DUREZOL) 0.05 % EMUL Place 1 drop into the right eye 2 (two) times daily.     . hydrocortisone 2.5 % cream Apply topically 2 (two) times daily. 30 g 1  . JARDIANCE 10 MG TABS tablet TAKE 1 TABLET BY MOUTH IN THE MORNING FOR DIABETES    . losartan-hydrochlorothiazide (HYZAAR) 100-25 MG tablet Take 1 tablet by mouth daily.    . meclizine (ANTIVERT) 12.5 MG tablet Take 1 tablet (12.5 mg total) by mouth 2 (two) times daily as needed for dizziness. 10 tablet 0  . metFORMIN (GLUCOPHAGE) 500 MG tablet Take 500 mg by mouth 2 (two) times daily with a meal.    . oxybutynin (DITROPAN-XL) 10 MG 24 hr tablet Take 1 tablet (10 mg total) by  mouth daily. 90 tablet 0  . sildenafil (REVATIO) 20 MG tablet Take 3 to 5 tablets two hours before intercouse on an empty stomach.  Do not take with nitrates. 50 tablet 3  . sildenafil (VIAGRA) 100 MG tablet Take 1 tablet (100 mg total) by mouth daily as needed for erectile dysfunction. Take two hours prior to intercourse on an empty stomach 30 tablet 3    Musculoskeletal: Strength & Muscle Tone: within normal limits Gait & Station: normal Patient leans: N/A  Psychiatric Specialty Exam: Physical Exam  Nursing note and vitals reviewed. Constitutional: He is oriented to person, place, and time. He appears well-developed.  Respiratory: Effort normal.  Musculoskeletal:        General: Normal range of motion.     Cervical back: Normal range of motion and neck supple.  Neurological: He is alert and oriented to person, place, and time.    Review of Systems  Psychiatric/Behavioral: Positive for sleep disturbance and suicidal ideas. The patient is nervous/anxious.   All other systems reviewed and are negative.   Blood pressure (!) 151/77, pulse (!) 53, temperature 98.2 F (36.8 C), temperature source Oral, resp. rate 10, height 5\' 6"  (1.676 m), weight 60.3 kg, SpO2 94 %.Body mass index is 21.47 kg/m.  General Appearance: Casual  Eye Contact:  Good  Speech:  Clear and Coherent  Volume:  Decreased  Mood:  Depressed and Hopeless  Affect:  Congruent, Depressed and Flat  Thought Process:  Coherent  Orientation:  Full (Time, Place, and Person)  Thought Content:  Logical  Suicidal Thoughts:  Yes.  without intent/plan  Homicidal Thoughts:  No  Memory:  Immediate;   Fair Recent;   Fair Remote;   Fair  Judgement:  Poor  Insight:  Lacking  Psychomotor Activity:  Normal  Concentration:  Concentration: Good and Attention Span: Good  Recall:  Good  Fund of Knowledge:  Fair  Language:  Fair  Akathisia:  NA  Handed:  Right  AIMS (if indicated):     Assets:  Communication Skills Desire for  Improvement Physical Health Resilience Social  Support  ADL's:  Intact  Cognition:  WNL  Sleep:    Okay     Treatment Plan Summary: Daily contact with patient to assess and evaluate symptoms and progress in treatment and Plan Patient will be observed overnight and reassess in the a.m. to determine if he meets criteria for psychiatric inpatient admission or he could be discharged home.  Disposition: Supportive therapy provided about ongoing stressors.  Patient will be observed overnight and reassess in the a.m. to determine if he meets criteria for psychiatric inpatient admission or he could be discharged home. The patient currently does not have an outpatient psychiatric provider or therapist, and he could benefit from outpatient services.   Caroline Sauger, NP 04/18/2020 4:54 AM

## 2020-04-18 NOTE — Discharge Instructions (Addendum)
Do not drink alcohol heavily or use illegal drugs as these will cause seizures.  Return to the ER for recurrent or worsening symptoms, persistent vomiting, difficulty breathing or other concerns.

## 2020-04-26 ENCOUNTER — Other Ambulatory Visit: Payer: Self-pay

## 2020-04-26 ENCOUNTER — Emergency Department
Admission: EM | Admit: 2020-04-26 | Discharge: 2020-04-26 | Disposition: A | Discharge status: Home / Self Care | Payer: Medicare HMO | Attending: Emergency Medicine | Admitting: Emergency Medicine

## 2020-04-26 DIAGNOSIS — R109 Unspecified abdominal pain: Secondary | ICD-10-CM | POA: Insufficient documentation

## 2020-04-26 DIAGNOSIS — Z5321 Procedure and treatment not carried out due to patient leaving prior to being seen by health care provider: Secondary | ICD-10-CM | POA: Diagnosis not present

## 2020-04-26 LAB — CBC
HCT: 41 % (ref 39.0–52.0)
Hemoglobin: 15 g/dL (ref 13.0–17.0)
MCH: 29.4 pg (ref 26.0–34.0)
MCHC: 36.6 g/dL — ABNORMAL HIGH (ref 30.0–36.0)
MCV: 80.4 fL (ref 80.0–100.0)
Platelets: 127 10*3/uL — ABNORMAL LOW (ref 150–400)
RBC: 5.1 MIL/uL (ref 4.22–5.81)
RDW: 13.6 % (ref 11.5–15.5)
WBC: 4.2 10*3/uL (ref 4.0–10.5)
nRBC: 0 % (ref 0.0–0.2)

## 2020-04-26 LAB — COMPREHENSIVE METABOLIC PANEL
ALT: 26 U/L (ref 0–44)
AST: 53 U/L — ABNORMAL HIGH (ref 15–41)
Albumin: 3.9 g/dL (ref 3.5–5.0)
Alkaline Phosphatase: 64 U/L (ref 38–126)
Anion gap: 14 (ref 5–15)
BUN: 11 mg/dL (ref 8–23)
CO2: 22 mmol/L (ref 22–32)
Calcium: 9.4 mg/dL (ref 8.9–10.3)
Chloride: 103 mmol/L (ref 98–111)
Creatinine, Ser: 1.19 mg/dL (ref 0.61–1.24)
GFR calc Af Amer: >60 mL/min (ref >60)
GFR calc non Af Amer: >60 mL/min (ref >60)
Glucose, Bld: 87 mg/dL (ref 70–99)
Potassium: 3.9 mmol/L (ref 3.5–5.1)
Sodium: 139 mmol/L (ref 135–145)
Total Bilirubin: 1.3 mg/dL — ABNORMAL HIGH (ref 0.3–1.2)
Total Protein: 7.4 g/dL (ref 6.5–8.1)

## 2020-04-26 LAB — URINALYSIS, COMPLETE (UACMP) WITH MICROSCOPIC
Bilirubin Urine: NEGATIVE
Glucose, UA: NEGATIVE mg/dL
Hgb urine dipstick: NEGATIVE
Ketones, ur: 5 mg/dL — AB
Leukocytes,Ua: NEGATIVE
Nitrite: NEGATIVE
Protein, ur: 30 mg/dL — AB
Specific Gravity, Urine: 1.018 (ref 1.005–1.030)
pH: 5 (ref 5.0–8.0)

## 2020-04-26 NOTE — ED Notes (Signed)
Pt visualized in NAD, ambulatory around the lobby with NAD noted at this time.

## 2020-04-26 NOTE — ED Triage Notes (Signed)
Pt states mid lower abd pain for several months since having prostate surgery. Pt states he would also like to get in a rehab program for heroin. Pt states he would also like to be tested for TB due to cough, but denies hemoptysis. Pt denies fever.

## 2020-04-26 NOTE — ED Notes (Signed)
This RN notified by radiology that patient making statements to radiology "I'd hate to have to make a scene and throw myself on the ground out here, I've been out here for hours and haven't been seen". This RN states understanding and RN aware.

## 2020-04-28 ENCOUNTER — Emergency Department: Payer: Medicare HMO

## 2020-04-28 ENCOUNTER — Other Ambulatory Visit: Payer: Self-pay

## 2020-04-28 DIAGNOSIS — R45851 Suicidal ideations: Secondary | ICD-10-CM | POA: Insufficient documentation

## 2020-04-28 DIAGNOSIS — F17210 Nicotine dependence, cigarettes, uncomplicated: Secondary | ICD-10-CM | POA: Diagnosis not present

## 2020-04-28 DIAGNOSIS — Z79899 Other long term (current) drug therapy: Secondary | ICD-10-CM | POA: Diagnosis not present

## 2020-04-28 DIAGNOSIS — I10 Essential (primary) hypertension: Secondary | ICD-10-CM | POA: Diagnosis not present

## 2020-04-28 DIAGNOSIS — Z20822 Contact with and (suspected) exposure to covid-19: Secondary | ICD-10-CM | POA: Insufficient documentation

## 2020-04-28 DIAGNOSIS — F1123 Opioid dependence with withdrawal: Secondary | ICD-10-CM | POA: Insufficient documentation

## 2020-04-28 DIAGNOSIS — F331 Major depressive disorder, recurrent, moderate: Secondary | ICD-10-CM | POA: Insufficient documentation

## 2020-04-28 DIAGNOSIS — F1010 Alcohol abuse, uncomplicated: Secondary | ICD-10-CM | POA: Diagnosis not present

## 2020-04-28 DIAGNOSIS — R569 Unspecified convulsions: Secondary | ICD-10-CM | POA: Insufficient documentation

## 2020-04-28 DIAGNOSIS — R300 Dysuria: Secondary | ICD-10-CM | POA: Insufficient documentation

## 2020-04-28 DIAGNOSIS — R1030 Lower abdominal pain, unspecified: Secondary | ICD-10-CM | POA: Diagnosis not present

## 2020-04-28 DIAGNOSIS — E119 Type 2 diabetes mellitus without complications: Secondary | ICD-10-CM | POA: Insufficient documentation

## 2020-04-28 DIAGNOSIS — M25511 Pain in right shoulder: Secondary | ICD-10-CM | POA: Diagnosis not present

## 2020-04-28 DIAGNOSIS — G8929 Other chronic pain: Secondary | ICD-10-CM | POA: Diagnosis not present

## 2020-04-28 DIAGNOSIS — Z7984 Long term (current) use of oral hypoglycemic drugs: Secondary | ICD-10-CM | POA: Insufficient documentation

## 2020-04-28 LAB — URINALYSIS, COMPLETE (UACMP) WITH MICROSCOPIC
Bacteria, UA: NONE SEEN
Bilirubin Urine: NEGATIVE
Glucose, UA: NEGATIVE mg/dL
Hgb urine dipstick: NEGATIVE
Ketones, ur: NEGATIVE mg/dL
Leukocytes,Ua: NEGATIVE
Nitrite: NEGATIVE
Protein, ur: NEGATIVE mg/dL
Specific Gravity, Urine: 1.021 (ref 1.005–1.030)
pH: 5 (ref 5.0–8.0)

## 2020-04-28 NOTE — ED Triage Notes (Signed)
Pt arrives to ED via POV from home with c/o right shoulder pain and seizure. Pt reports no h/x of seizures, is not r/x'd any medications for same. Pt reports the shoulder pain is "from an old injury 6 yrs ago". Pt denies any recent injury or trauma. Pt also reports hematuria "for the last 2 weeks". Pt denies CP or SHOB. Pt is A&O, in NAD; RR even, regular, and unlabored.

## 2020-04-28 NOTE — ED Notes (Signed)
Pt refusing blood draw at this time d/t "having it done yesterday". Explained to pt the need for new blood to compare results and look for changes, but pt states he will wait until roomed and will discuss with the EDP any need at that time for additional lab work.

## 2020-04-29 ENCOUNTER — Emergency Department: Payer: Medicare HMO

## 2020-04-29 ENCOUNTER — Emergency Department
Admission: EM | Admit: 2020-04-29 | Discharge: 2020-05-01 | Disposition: A | Discharge status: Home / Self Care | Payer: Medicare HMO | Attending: Emergency Medicine | Admitting: Emergency Medicine

## 2020-04-29 ENCOUNTER — Encounter: Payer: Self-pay | Admitting: Radiology

## 2020-04-29 DIAGNOSIS — R45851 Suicidal ideations: Secondary | ICD-10-CM

## 2020-04-29 DIAGNOSIS — R569 Unspecified convulsions: Secondary | ICD-10-CM | POA: Diagnosis not present

## 2020-04-29 DIAGNOSIS — G8929 Other chronic pain: Secondary | ICD-10-CM

## 2020-04-29 DIAGNOSIS — R1030 Lower abdominal pain, unspecified: Secondary | ICD-10-CM

## 2020-04-29 LAB — CBC WITH DIFFERENTIAL/PLATELET
Abs Immature Granulocytes: 0.01 10*3/uL (ref 0.00–0.07)
Basophils Absolute: 0 10*3/uL (ref 0.0–0.1)
Basophils Relative: 1 %
Eosinophils Absolute: 0.1 10*3/uL (ref 0.0–0.5)
Eosinophils Relative: 3 %
HCT: 37.8 % — ABNORMAL LOW (ref 39.0–52.0)
Hemoglobin: 13.8 g/dL (ref 13.0–17.0)
Immature Granulocytes: 0 %
Lymphocytes Relative: 23 %
Lymphs Abs: 1 10*3/uL (ref 0.7–4.0)
MCH: 29.9 pg (ref 26.0–34.0)
MCHC: 36.5 g/dL — ABNORMAL HIGH (ref 30.0–36.0)
MCV: 81.8 fL (ref 80.0–100.0)
Monocytes Absolute: 0.6 10*3/uL (ref 0.1–1.0)
Monocytes Relative: 13 %
Neutro Abs: 2.6 10*3/uL (ref 1.7–7.7)
Neutrophils Relative %: 60 %
Platelets: 121 10*3/uL — ABNORMAL LOW (ref 150–400)
RBC: 4.62 MIL/uL (ref 4.22–5.81)
RDW: 13.9 % (ref 11.5–15.5)
WBC: 4.3 10*3/uL (ref 4.0–10.5)
nRBC: 0 % (ref 0.0–0.2)

## 2020-04-29 LAB — COMPREHENSIVE METABOLIC PANEL
ALT: 23 U/L (ref 0–44)
AST: 39 U/L (ref 15–41)
Albumin: 3.7 g/dL (ref 3.5–5.0)
Alkaline Phosphatase: 56 U/L (ref 38–126)
Anion gap: 11 (ref 5–15)
BUN: 13 mg/dL (ref 8–23)
CO2: 27 mmol/L (ref 22–32)
Calcium: 9.2 mg/dL (ref 8.9–10.3)
Chloride: 103 mmol/L (ref 98–111)
Creatinine, Ser: 1.16 mg/dL (ref 0.61–1.24)
GFR calc Af Amer: >60 mL/min (ref >60)
GFR calc non Af Amer: >60 mL/min (ref >60)
Glucose, Bld: 82 mg/dL (ref 70–99)
Potassium: 3.5 mmol/L (ref 3.5–5.1)
Sodium: 141 mmol/L (ref 135–145)
Total Bilirubin: 1 mg/dL (ref 0.3–1.2)
Total Protein: 6.8 g/dL (ref 6.5–8.1)

## 2020-04-29 LAB — ETHANOL: Alcohol, Ethyl (B): <10 mg/dL

## 2020-04-29 LAB — PROTIME-INR
INR: 1.1 (ref 0.8–1.2)
Prothrombin Time: 13.3 s (ref 11.4–15.2)

## 2020-04-29 LAB — LIPASE, BLOOD: Lipase: 88 U/L — ABNORMAL HIGH (ref 11–51)

## 2020-04-29 LAB — TROPONIN I (HIGH SENSITIVITY)
Troponin I (High Sensitivity): 21 ng/L — ABNORMAL HIGH (ref <18)
Troponin I (High Sensitivity): 21 ng/L — ABNORMAL HIGH (ref <18)

## 2020-04-29 LAB — SARS CORONAVIRUS 2 BY RT PCR (HOSPITAL ORDER, PERFORMED IN ~~LOC~~ HOSPITAL LAB): SARS Coronavirus 2: NEGATIVE

## 2020-04-29 MED ORDER — THIAMINE HCL 100 MG/ML IJ SOLN: 100.0000 mg | Freq: Every day | INTRAMUSCULAR | Status: DC

## 2020-04-29 MED ORDER — DULOXETINE HCL 30 MG PO CPEP
30.0000 mg | ORAL_CAPSULE | Freq: Every day | ORAL | Status: DC
  Administered 2020-04-30: 30 mg via ORAL
  Filled 2020-04-29 (×3): qty 1

## 2020-04-29 MED ORDER — LORAZEPAM 2 MG PO TABS: 0.0000 mg | ORAL_TABLET | Freq: Two times a day (BID) | ORAL | Status: DC

## 2020-04-29 MED ORDER — ONDANSETRON HCL 4 MG/2ML IJ SOLN
4.0000 mg | Freq: Once | INTRAMUSCULAR | Status: AC
  Administered 2020-04-29: 4 mg via INTRAVENOUS
  Filled 2020-04-29: qty 2

## 2020-04-29 MED ORDER — CLONIDINE HCL 0.1 MG PO TABS: 0.1000 mg | ORAL_TABLET | ORAL | Status: DC

## 2020-04-29 MED ORDER — IOHEXOL 300 MG/ML  SOLN
100.0000 mL | Freq: Once | INTRAMUSCULAR | Status: AC | PRN
  Administered 2020-04-29: 100 mL via INTRAVENOUS

## 2020-04-29 MED ORDER — IBUPROFEN 800 MG PO TABS
400.0000 mg | ORAL_TABLET | Freq: Three times a day (TID) | ORAL | Status: DC
  Administered 2020-04-29 – 2020-04-30 (×5): 400 mg via ORAL
  Filled 2020-04-29 (×5): qty 1

## 2020-04-29 MED ORDER — MORPHINE SULFATE (PF) 4 MG/ML IV SOLN
4.0000 mg | Freq: Once | INTRAVENOUS | Status: AC
  Administered 2020-04-29: 4 mg via INTRAVENOUS
  Filled 2020-04-29: qty 1

## 2020-04-29 MED ORDER — LORAZEPAM 2 MG/ML IJ SOLN: 0.0000 mg | Freq: Two times a day (BID) | INTRAMUSCULAR | Status: DC

## 2020-04-29 MED ORDER — LEVETIRACETAM 500 MG PO TABS
500.0000 mg | ORAL_TABLET | Freq: Two times a day (BID) | ORAL | 1 refills | Status: DC
Start: 2020-04-29 — End: 2022-04-18

## 2020-04-29 MED ORDER — OLANZAPINE 5 MG PO TABS
5.0000 mg | ORAL_TABLET | Freq: Every day | ORAL | Status: DC
  Administered 2020-04-29 – 2020-04-30 (×2): 5 mg via ORAL
  Filled 2020-04-29 (×2): qty 1

## 2020-04-29 MED ORDER — NAPROXEN 500 MG PO TABS
500.0000 mg | ORAL_TABLET | Freq: Two times a day (BID) | ORAL | Status: DC | PRN
  Filled 2020-04-29: qty 1

## 2020-04-29 MED ORDER — CLONIDINE HCL 0.1 MG PO TABS: 0.1000 mg | ORAL_TABLET | Freq: Every day | ORAL | Status: DC

## 2020-04-29 MED ORDER — LOPERAMIDE HCL 2 MG PO CAPS
2.0000 mg | ORAL_CAPSULE | ORAL | Status: DC | PRN
  Filled 2020-04-29: qty 2

## 2020-04-29 MED ORDER — DICYCLOMINE HCL 20 MG PO TABS
20.0000 mg | ORAL_TABLET | Freq: Four times a day (QID) | ORAL | Status: DC | PRN
  Filled 2020-04-29: qty 1

## 2020-04-29 MED ORDER — METHOCARBAMOL 500 MG PO TABS
500.0000 mg | ORAL_TABLET | Freq: Three times a day (TID) | ORAL | Status: DC | PRN
  Filled 2020-04-29: qty 1

## 2020-04-29 MED ORDER — LEVETIRACETAM IN NACL 1500 MG/100ML IV SOLN
1500.0000 mg | Freq: Once | INTRAVENOUS | Status: AC
  Administered 2020-04-29: 1500 mg via INTRAVENOUS
  Filled 2020-04-29 (×2): qty 100

## 2020-04-29 MED ORDER — HYDROXYZINE HCL 25 MG PO TABS: 25.0000 mg | ORAL_TABLET | Freq: Four times a day (QID) | ORAL | Status: DC | PRN

## 2020-04-29 MED ORDER — LORAZEPAM 2 MG PO TABS
0.0000 mg | ORAL_TABLET | Freq: Four times a day (QID) | ORAL | Status: DC
  Administered 2020-04-29 – 2020-04-30 (×3): 1 mg via ORAL
  Filled 2020-04-29 (×3): qty 1

## 2020-04-29 MED ORDER — CLONIDINE HCL 0.1 MG PO TABS
0.1000 mg | ORAL_TABLET | Freq: Four times a day (QID) | ORAL | Status: DC
  Administered 2020-04-29 – 2020-04-30 (×5): 0.1 mg via ORAL
  Filled 2020-04-29 (×5): qty 1

## 2020-04-29 MED ORDER — LORAZEPAM 2 MG/ML IJ SOLN: 0.0000 mg | Freq: Four times a day (QID) | INTRAMUSCULAR | Status: DC

## 2020-04-29 MED ORDER — ONDANSETRON 4 MG PO TBDP
4.0000 mg | ORAL_TABLET | Freq: Four times a day (QID) | ORAL | Status: DC | PRN
  Administered 2020-04-29: 4 mg via ORAL
  Filled 2020-04-29 (×2): qty 1

## 2020-04-29 MED ORDER — LEVETIRACETAM 500 MG PO TABS
500.0000 mg | ORAL_TABLET | Freq: Two times a day (BID) | ORAL | Status: DC
  Administered 2020-04-29 – 2020-04-30 (×3): 500 mg via ORAL
  Filled 2020-04-29 (×4): qty 1

## 2020-04-29 MED ORDER — THIAMINE HCL 100 MG PO TABS
100.0000 mg | ORAL_TABLET | Freq: Every day | ORAL | Status: DC
  Administered 2020-04-30: 100 mg via ORAL
  Filled 2020-04-29: qty 1

## 2020-04-29 NOTE — Discharge Instructions (Addendum)
You have been seen in the Emergency Department (ED) for abdominal pain.  Your evaluation did not identify a clear cause of your symptoms but was generally reassuring.  Abdominal pain has many possible causes. Some aren't serious and get better on their own in a few days. Others need more testing and treatment. If your pain continues or gets worse, you need to be rechecked and may need more tests to find out what is wrong. You may need surgery to correct the problem.   Follow up with your doctor in 12-24 hours if you are still having abdominal pain. Otherwise follow up in 1-3 days for a re-check  Don't ignore new symptoms, such as fever, nausea and vomiting, new or worsening abdominal pain, urination problems, bloody diarrhea or bloody stools, black tarry stools, uncontrollable nausea and vomiting, and dizziness. These may be signs of a more serious problem. If you develop any of these you should be seen by your doctor immediately or return to the ED.   How can you care for yourself at home?  Rest until you feel better.  To prevent dehydration, drink plenty of fluids, enough so that your urine is light yellow or clear like water. Choose water and other caffeine-free clear liquids until you feel better. If you have kidney, heart, or liver disease and have to limit fluids, talk with your doctor before you increase the amount of fluids you drink.  If your stomach is upset, eat mild foods, such as rice, dry toast or crackers, bananas, and applesauce. Try eating several small meals instead of two or three large ones.  Wait until 48 hours after all symptoms have gone away before you have spicy foods, alcohol, and drinks that contain caffeine.  Do not eat foods that are high in fat.  Avoid anti-inflammatory medicines such as aspirin, ibuprofen (Advil, Motrin), and naproxen (Aleve). These can cause stomach upset. Talk to your doctor if you take daily aspirin for another health problem.  When should you call  for help?  Call 911 anytime you think you may need emergency care. For example, call if:  You passed out (lost consciousness).  You pass maroon or very bloody stools.  You vomit blood or what looks like coffee grounds.  You have new, severe belly pain.  Call your doctor now or seek immediate medical care if:  Your pain gets worse, especially if it becomes focused in one area of your belly.  You have a new or higher fever.  Your stools are black and look like tar, or they have streaks of blood.  You have unexpected vaginal bleeding.  You have symptoms of a urinary tract infection. These may include:  Pain when you urinate.  Urinating more often than usual.  Blood in your urine. You are dizzy or lightheaded, or you feel like you may faint. Watch closely for changes in your health, and be sure to contact your doctor if:  You are not getting better after 1 day (24 hours). __________________________________   Seizures may happen at any time. It is important to take certain precautions to maintain your safety.   Follow up with your doctor in 1-3 days.  If you were started on a seizure medication, take it as prescribed.  During a seizure, a person may injure himself or herself. Seizure precautions are guidelines that a person can follow in order to minimize injury during a seizure. For any activity, it is important to ask, "What would happen if I had a  seizure while doing this?" Follow the below precautions.  Bathroom Safety  A person with seizures may want to shower instead of bathe to avoid accidental drowning. If falls occur during the patient's typical seizure, a person should use a shower seat, preferably one with a safety strap.  Use nonskid strips in your shower or tub.  Never use electrical equipment near water. This prevents accidental electrocution.  Consider changing glass in shower doors to shatterproof glass.  Risk analyst If possible, cook when someone else is nearby.    Use the back burners of the stove to prevent accidental burns.  Use shatterproof containers as much as possible. For instance, sauces can be transferred from glass bottles to plastic containers for use.  Limit time that is required using knives or other sharp objects. If possible, buy foods that are already cut, or ask someone to help in meal preparation.   General Safety at [place] not smoke or light fires in the fireplace unless someone else is present.  Do not use space heaters that can be accidentally overturned.  When alone, avoid using step stools or ladders, and do not clean rooftop gutters.  Purchase power tools and motorized Company secretary which have a safety switch that will stop the machine if you release the handle (a 'dead man's' switch).   Driving and Transportation DO NOT [place] and/or you have permission to drive from your state's Department of Motor Vehicles  [place]). Each state has different laws. Please refer to the following link on the [place] website for more information: http://www.epilepsyfoundation.org/answerplace/Social/driving/drivingu.cfm  If you ride a bicycle, wear a helmet and any other necessary protective gear.  When taking public transportation like the bus or subway, stay clear of the platform edge.   Outdoor Insurance underwriter is okay, but does present certain risks. Never swim alone, and tell friends what to do if you have a seizure while swimming.  Wear appropriate protective equipment.  Ski with a friend. If a seizure occurs, your friend can seek help, if needed. He or she can also help to get you out of the cold. Consider using a safety hook or belt while riding the ski lift.

## 2020-04-29 NOTE — ED Notes (Signed)
Pt transferred into ED BHU room 5   Patient assigned to appropriate care area. Patient oriented to unit/care area: Informed that, for their safety, care areas are designed for safety and monitored by security cameras at all times; Visiting hours and phone times explained to patient. Patient verbalizes understanding, and verbal contract for safety obtained.   assessment completed   He denies pain

## 2020-04-29 NOTE — Consult Note (Addendum)
[facility] Face-to-Face Psychiatry Consult   Reason for Consult: Opiate detox need   Referring Physician:   ED MD    Patient Identification: Alexander Avery MRN:  [id number] Principal Diagnosis:   Opiate dependence and withdrawal Major depression moderate recurrent History of seizures       Diagnosis:  Total Time spent with patient:  One hour plus twenty    Subjective:   Alexander Avery is a 64 y.o. male patient admitted with opiate withdrawal and depression  He is not imminently suicidal or homicidal but seeks treatment for above and rehab   HPI:  As above --technically homeless for at least two weeks.  Sister could not have him continue in her house while he actively smokes heroin  He has a long history with no major break in sobriety -  He also has major depression severe recurrent with depressed --mood crying --spells hopeless helpless feelings --lack of energy, motivation concentration lack of sleep, anhedonia, loss of interest enthusiasm---no SI or HI ---at this time   He is open to take  Depression medications now   He has a history of seizures but does not recall his course and his medications.    Past Psychiatric History:  No recent admissions, detox rehab day treatment IOP or AA NA or related programming at this time     Risk to Self:  none  Risk to Others:  none  Prior Inpatient Therapy:  not recently some years ago --he is vague and does not recall  Prior Outpatient Therapy:  ---none recently   Past Medical History:  Past Medical History:  Diagnosis Date  . Anxiety   . Arthritis   . Cancer [facility])    prostate  . Cataract 2019  . Diabetes mellitus without complication ([place])   . GERD (gastroesophageal reflux disease)   . Hernia of abdominal wall 2015  . History of stomach ulcers   . Hypertension   . Prostate cancer ([place])   . Seizures ([place])   . Sleep apnea     Past Surgical History:  Procedure Laterality Date  . CATARACT EXTRACTION W/PHACO Right  03/30/2018   Procedure: CATARACT EXTRACTION PHACO AND INTRAOCULAR LENS PLACEMENT (IOC);  Surgeon: Eulogio Bear, MD;  Location: [facility] ORS;  Service: Ophthalmology;  Laterality: Right;  [place] 01:09.5 AP% 10.4 CDE 7.98 Fluid pack lot # [id number]  . CATARACT EXTRACTION W/PHACO Left 06/29/2018   Procedure: CATARACT EXTRACTION PHACO AND INTRAOCULAR LENS PLACEMENT (IOC);  Surgeon: Eulogio Bear, MD;  Location: [facility] ORS;  Service: Ophthalmology;  Laterality: Left;  [place] 01:27.7 AP% 8.2 CDE 7.27 Fluid pack lot # [id number]  . EYE SURGERY    . HERNIA REPAIR    . PROSTATE SURGERY    . PROSTATE SURGERY  2016   Family History:  Family History  Problem Relation Age of Onset  . Cancer Mother    Family Psychiatric  History:  He does not recall   Social History:  Social History   Substance and Sexual Activity  Alcohol Use Yes  . Alcohol/week: 2.0 standard drinks  . Types: 2 Cans of beer per week   Comment: 2 beers twice a week     Social History   Substance and Sexual Activity  Drug Use No    Social History   Socioeconomic History  . Marital status: Single    Spouse name: Not on file  . Number of children: Not on file  . Years of education: Not on file  .  Highest education level: Not on file  Occupational History  . Not on file  Tobacco Use  . Smoking status: Current Some Day Smoker    Packs/day: 2.00  . Smokeless tobacco: Never Used  Vaping Use  . Vaping Use: Never used  Substance and Sexual Activity  . Alcohol use: Yes    Alcohol/week: 2.0 standard drinks    Types: 2 Cans of beer per week    Comment: 2 beers twice a week  . Drug use: No  . Sexual activity: Not on file  Other Topics Concern  . Not on file  Social History Narrative  . Not on file   Social Determinants of Health   Financial Resource Strain:   . Difficulty of Paying Living Expenses:   Food Insecurity:   . Worried About Charity fundraiser in the Last Year:   . Arboriculturist in the Last Year:    Transportation Needs:   . Film/video editor (Medical):   Marland Kitchen Lack of Transportation (Non-Medical):   Physical Activity:   . Days of Exercise per Week:   . Minutes of Exercise per Session:   Stress:   . Feeling of Stress :   Social Connections:   . Frequency of Communication with Friends and Family:   . Frequency of Social Gatherings with Friends and Family:   . Attends Religious Services:   . Active Member of Clubs or Organizations:   . Attends Archivist Meetings:   Marland Kitchen Marital Status:    Additional Social History: homeless recently evicted from his sister 's house  Has disability from prostate cancer--- Money is spent on heroin mainly     Allergies:   Allergies  Allergen Reactions  . Penicillins Hives and Other (See Comments)    Urinate on self, pass out Has patient had a PCN reaction causing immediate rash, facial/tongue/throat swelling, SOB or lightheadedness with hypotension: Yes Has patient had a PCN reaction causing severe rash involving mucus membranes or skin necrosis: No Has patient had a PCN reaction that required hospitalization: Yes- In hospital Has patient had a PCN reaction occurring within the last 10 years: Yes If all of the above answers are "NO", then may proceed with Cephalosporin use.     Labs:  Results for orders placed or performed during the hospital encounter of 04/29/20 (from the past 48 hour(s))  Urinalysis, Complete w Microscopic     Status: Abnormal   Collection Time: 04/28/20 10:27 PM  Result Value Ref Range   Color, Urine YELLOW (A) YELLOW   APPearance CLEAR (A) CLEAR   Specific Gravity, Urine 1.021 1.005 - 1.030   pH 5.0 5.0 - 8.0   Glucose, UA NEGATIVE NEGATIVE mg/dL   Hgb urine dipstick NEGATIVE NEGATIVE   Bilirubin Urine NEGATIVE NEGATIVE   Ketones, ur NEGATIVE NEGATIVE mg/dL   Protein, ur NEGATIVE NEGATIVE mg/dL   Nitrite NEGATIVE NEGATIVE   Leukocytes,Ua NEGATIVE NEGATIVE   RBC / HPF 0-5 0 - 5 RBC/hpf   WBC, UA 0-5  0 - 5 WBC/hpf   Bacteria, UA NONE SEEN NONE SEEN   Squamous Epithelial / LPF 0-5 0 - 5   Mucus PRESENT    Hyaline Casts, UA PRESENT     Comment: Performed at [facility], [address] [place], [place] [address]  Troponin I (High Sensitivity)     Status: Abnormal   Collection Time: 04/29/20  3:55 AM  Result Value Ref Range   Troponin I (High Sensitivity)  21 (H) <18 ng/L    Comment: (NOTE) Elevated high sensitivity troponin I (hsTnI) values and significant  changes across serial measurements may suggest ACS but many other  chronic and acute conditions are known to elevate hsTnI results.  Refer to the "Links" section for chest pain algorithms and additional  guidance. Performed at [facility], [address] [place], [place] [address]   Comprehensive metabolic panel     Status: None   Collection Time: 04/29/20  3:55 AM  Result Value Ref Range   Sodium 141 135 - 145 mmol/L   Potassium 3.5 3.5 - 5.1 mmol/L   Chloride 103 98 - 111 mmol/L   CO2 27 22 - 32 mmol/L   Glucose, Bld 82 70 - 99 mg/dL    Comment: Glucose reference range applies only to samples taken after fasting for at least 8 hours.   BUN 13 8 - 23 mg/dL   Creatinine, Ser 1.16 0.61 - 1.24 mg/dL   Calcium 9.2 8.9 - 10.3 mg/dL   Total Protein 6.8 6.5 - 8.1 g/dL   Albumin 3.7 3.5 - 5.0 g/dL   AST 39 15 - 41 U/L   ALT 23 0 - 44 U/L   Alkaline Phosphatase 56 38 - 126 U/L   Total Bilirubin 1.0 0.3 - 1.2 mg/dL   GFR calc non Af Amer >60 >60 mL/min   GFR calc Af Amer >60 >60 mL/min   Anion gap 11 5 - 15    Comment: Performed at [facility], [address] [place], [place] [address]  Lipase, blood     Status: Abnormal   Collection Time: 04/29/20  3:55 AM  Result Value Ref Range   Lipase 88 (H) 11 - 51 U/L    Comment: Performed at [facility], [address] [place], [place] [address]  CBC with Differential/Platelet     Status: Abnormal   Collection Time: 04/29/20  3:55 AM   Result Value Ref Range   WBC 4.3 4.0 - 10.5 K/uL   RBC 4.62 4.22 - 5.81 MIL/uL   Hemoglobin 13.8 13.0 - 17.0 g/dL   HCT 37.8 (L) 39 - 52 %   MCV 81.8 80.0 - 100.0 fL   MCH 29.9 26.0 - 34.0 pg   MCHC 36.5 (H) 30.0 - 36.0 g/dL   RDW 13.9 11.5 - 15.5 %   Platelets 121 (L) 150 - 400 K/uL   nRBC 0.0 0.0 - 0.2 %   Neutrophils Relative % 60 %   Neutro Abs 2.6 1.7 - 7.7 K/uL   Lymphocytes Relative 23 %   Lymphs Abs 1.0 0.7 - 4.0 K/uL   Monocytes Relative 13 %   Monocytes Absolute 0.6 0 - 1 K/uL   Eosinophils Relative 3 %   Eosinophils Absolute 0.1 0 - 0 K/uL   Basophils Relative 1 %   Basophils Absolute 0.0 0 - 0 K/uL   Immature Granulocytes 0 %   Abs Immature Granulocytes 0.01 0.00 - 0.07 K/uL    Comment: Performed at [facility], [address] [place], [place] [address]  Protime-INR     Status: None   Collection Time: 04/29/20  3:55 AM  Result Value Ref Range   Prothrombin Time 13.3 11.4 - 15.2 seconds   INR 1.1 0.8 - 1.2    Comment: (NOTE) INR goal varies based on device and disease states. Performed at [facility], [address] [place], [place] [address]   Ethanol  Status: None   Collection Time: 04/29/20  4:19 AM  Result Value Ref Range   Alcohol, Ethyl (B) <10 <10 mg/dL    Comment: (NOTE) Lowest detectable limit for serum alcohol is 10 mg/dL.  For medical purposes only. Performed at [facility], [place], [place] [address]   Troponin I (High Sensitivity)     Status: Abnormal   Collection Time: 04/29/20  6:16 AM  Result Value Ref Range   Troponin I (High Sensitivity) 21 (H) <18 ng/L    Comment: (NOTE) Elevated high sensitivity troponin I (hsTnI) values and significant  changes across serial measurements may suggest ACS but many other  chronic and acute conditions are known to elevate hsTnI results.  Refer to the "Links" section for chest pain algorithms and additional  guidance. Performed at [facility], [address], [place], [place] [address]     Current Facility-Administered Medications  Medication Dose Route Frequency Provider Last Rate Last Admin  . cloNIDine (CATAPRES) tablet 0.1 mg  0.1 mg Oral QID Eulas Post, MD       Followed by  . [START ON 05/01/2020] cloNIDine (CATAPRES) tablet 0.1 mg  0.1 mg Oral Kathrynn Ducking, MD       Followed by  . [START ON 05/04/2020] cloNIDine (CATAPRES) tablet 0.1 mg  0.1 mg Oral QAC breakfast Eulas Post, MD      . dicyclomine (BENTYL) tablet 20 mg  20 mg Oral Q6H PRN Eulas Post, MD      . hydrOXYzine (ATARAX/VISTARIL) tablet 25 mg  25 mg Oral Q6H PRN Eulas Post, MD      . ibuprofen (ADVIL) tablet 400 mg  400 mg Oral TID Eulas Post, MD      . loperamide (IMODIUM) capsule 2-4 mg  2-4 mg Oral PRN Eulas Post, MD      . methocarbamol (ROBAXIN) tablet 500 mg  500 mg Oral Q8H PRN Eulas Post, MD      . naproxen (NAPROSYN) tablet 500 mg  500 mg Oral BID PRN Eulas Post, MD      . ondansetron (ZOFRAN-ODT) disintegrating tablet 4 mg  4 mg Oral Q6H PRN Eulas Post, MD       Current Outpatient Medications  Medication Sig Dispense Refill  . acetaminophen (TYLENOL) 325 MG tablet Take 650 mg by mouth every 6 (six) hours as needed for moderate pain or headache.    Marland Kitchen atorvastatin (LIPITOR) 40 MG tablet Take 40 mg by mouth at bedtime.  4  . canagliflozin (INVOKANA) 300 MG TABS tablet Take 300 mg by mouth daily before breakfast.    . Difluprednate (DUREZOL) 0.05 % EMUL Place 1 drop into the right eye 2 (two) times daily.     . hydrocortisone 2.5 % cream Apply topically 2 (two) times daily. 30 g 1  . JARDIANCE 10 MG TABS tablet TAKE 1 TABLET BY MOUTH IN THE MORNING FOR DIABETES    . levETIRAcetam (KEPPRA) 500 MG tablet Take 1 tablet (500 mg total) by mouth 2 (two) times daily. 60 tablet 1  . losartan-hydrochlorothiazide (HYZAAR) 100-25 MG tablet Take 1 tablet by mouth daily.    . meclizine  (ANTIVERT) 12.5 MG tablet Take 1 tablet (12.5 mg total) by mouth 2 (two) times daily as needed for dizziness. 10 tablet 0  . metFORMIN (GLUCOPHAGE) 500 MG tablet Take 500 mg by mouth 2 (two) times daily with a meal.    . oxybutynin (DITROPAN-XL) 10 MG 24 hr tablet Take 1  tablet (10 mg total) by mouth daily. 90 tablet 0  . sildenafil (REVATIO) 20 MG tablet Take 3 to 5 tablets two hours before intercouse on an empty stomach.  Do not take with nitrates. 50 tablet 3  . sildenafil (VIAGRA) 100 MG tablet Take 1 tablet (100 mg total) by mouth daily as needed for erectile dysfunction. Take two hours prior to intercourse on an empty stomach 30 tablet 3    Musculoskeletal: Strength & Muscle Tone: feels bone pain and muscle pain  Gait & Station: limited from pain Patient leans: n/a   Psychiatric Specialty Exam: Physical Exam  Per ED   Review of Systems  8 systems looked at   Blood pressure (!) 183/91, pulse 85, temperature 98.2 F (36.8 C), temperature source Tympanic, resp. rate 17, height 5\' 6"  (1.676 m), weight 59 kg, SpO2 100 %.Body mass index is 20.98 kg/m.  General Appearance: AA male cooperative, rapport okay ---eye contact okay  Friendly feels remorseful  Tired and unkept   Eye Contact:  Normal   Speech:  normal  Volume:  Normal   Mood: depressed   Affect:  Blunted   Thought Process:  Normal   Orientation:  Times four okay  Thought Content:  Depressive and victim themes   Suicidal Thoughts:  none  Homicidal Thoughts:  None   Memory:  None   Judgement:  Poor   Insight:  Poor   Psychomotor Activity:  Slowed   Concentration:  okay  Recall:  Normal   Fund of Knowledge:  Below average   Language:  Okay English   Akathisia:  None   Handed:  right  AIMS (if indicated):     Assets:  He is at least here for treatment  ADL's:  Limited from pain   Cognition normal but somewhat slow   Sleep:   lack of sleep all three cycles      Treatment Plan Summary:   AA male with major  depression and opiate withdrawal --now here for COW protocol  Homeless with no sobriety--he is having bone pain, diarrhea rhinorrhea malaise head ache and fatigue   Admitted to observation Psych and then transfer to inpatient detox and then rehab  Antidepressants started including Cymbalta and Zyprexa for mood stabilization, sleep     Keppra added for prevention during this time in case of any seizure activity     Disposition:  See above   Eulas Post, MD 04/29/2020 2:40 PM

## 2020-04-29 NOTE — ED Notes (Signed)
Pt given meal tray and sprite. 

## 2020-04-29 NOTE — ED Notes (Signed)
Pt given sprite and informed that I ordered some lunch trays and will bring one to him once they arrive.

## 2020-04-29 NOTE — ED Notes (Signed)
VOL/  SEEN  BY  TTS  PENDING  PLACEMENT

## 2020-04-29 NOTE — ED Provider Notes (Signed)
[facility] Emergency Department Provider Note  ____________________________________________  Time seen: Approximately 3:16 AM  I have reviewed the triage vital signs and the nursing notes.   HISTORY  Chief Complaint Seizures and Shoulder Pain   HPI Alexander Avery is a 64 y.o. male the history of prostate cancer status post resection, diabetes, GERD, peptic ulcer disease, hypertension, seizures who presents for evaluation of abdominal pain, seizures, and right shoulder pain.  Patient reports that he has 2-3 seizures a year for the last 5 years.  Has never seen a neurologist and has never been started on medications.  He had a seizure earlier today.  Is also complaining of right shoulder pain.  He reports having surgery in that shoulder several years ago and has had chronic pain.  But over the last 4 days the pain has become severe.  Also complaining of a week of lower sharp abdominal pain which is also severe, constant and nonradiating.  Has been having some dysuria but no hematuria.  No nausea, vomiting, constipation, diarrhea, fever or chills.  No chest pain or shortness of breath.   Patient came to the emergency room 3 days ago for the abdominal pain but left without being seen due to long weights.  He has a history of alcohol abuse and drinks about 32 ounces of beer a day every day.  Past Medical History:  Diagnosis Date  . Anxiety   . Arthritis   . Cancer [facility])    prostate  . Cataract 2019  . Diabetes mellitus without complication ([place])   . GERD (gastroesophageal reflux disease)   . Hernia of abdominal wall 2015  . History of stomach ulcers   . Hypertension   . Prostate cancer ([place])   . Seizures ([place])   . Sleep apnea     Patient Active Problem List   Diagnosis Date Noted  . Polysubstance abuse ([place]) 04/18/2020  . Prostate cancer ([place]) 02/20/2019    Past Surgical History:  Procedure Laterality Date  . CATARACT EXTRACTION W/PHACO Right  03/30/2018   Procedure: CATARACT EXTRACTION PHACO AND INTRAOCULAR LENS PLACEMENT (IOC);  Surgeon: Eulogio Bear, MD;  Location: [facility] ORS;  Service: Ophthalmology;  Laterality: Right;  [place] 01:09.5 AP% 10.4 CDE 7.98 Fluid pack lot # [id number]  . CATARACT EXTRACTION W/PHACO Left 06/29/2018   Procedure: CATARACT EXTRACTION PHACO AND INTRAOCULAR LENS PLACEMENT (IOC);  Surgeon: Eulogio Bear, MD;  Location: [facility] ORS;  Service: Ophthalmology;  Laterality: Left;  [place] 01:27.7 AP% 8.2 CDE 7.27 Fluid pack lot # [id number]  . EYE SURGERY    . HERNIA REPAIR    . PROSTATE SURGERY    . PROSTATE SURGERY  2016    Prior to Admission medications   Medication Sig Start Date End Date Taking? Authorizing Provider  acetaminophen (TYLENOL) 325 MG tablet Take 650 mg by mouth every 6 (six) hours as needed for moderate pain or headache.    [provider]  atorvastatin (LIPITOR) 40 MG tablet Take 40 mg by mouth at bedtime. 05/22/18   [provider]  canagliflozin (INVOKANA) 300 MG TABS tablet Take 300 mg by mouth daily before breakfast.    [provider]  Difluprednate (DUREZOL) 0.05 % EMUL Place 1 drop into the right eye 2 (two) times daily.     [provider]  hydrocortisone 2.5 % cream Apply topically 2 (two) times daily. 04/26/19   Chrystal, Eulas Post, MD  JARDIANCE 10 MG TABS tablet TAKE 1 TABLET BY MOUTH  IN THE MORNING FOR DIABETES 02/08/19   [provider]  levETIRAcetam (KEPPRA) 500 MG tablet Take 1 tablet (500 mg total) by mouth 2 (two) times daily. 04/29/20   Rudene Re, MD  losartan-hydrochlorothiazide [facility]) 100-25 MG tablet Take 1 tablet by mouth daily.    [provider]  meclizine (ANTIVERT) 12.5 MG tablet Take 1 tablet (12.5 mg total) by mouth 2 (two) times daily as needed for dizziness. 06/28/18   Merlyn Lot, MD  metFORMIN (GLUCOPHAGE) 500 MG tablet Take 500 mg by mouth 2 (two) times daily with a meal.    [provider]    oxybutynin (DITROPAN-XL) 10 MG 24 hr tablet Take 1 tablet (10 mg total) by mouth daily. 11/22/19   [name] A, PA-C  sildenafil (REVATIO) 20 MG tablet Take 3 to 5 tablets two hours before intercouse on an empty stomach.  Do not take with nitrates. 12/22/18   [name] A, PA-C  sildenafil (VIAGRA) 100 MG tablet Take 1 tablet (100 mg total) by mouth daily as needed for erectile dysfunction. Take two hours prior to intercourse on an empty stomach 01/31/20   [name] A, PA-C    Allergies Penicillins  Family History  Problem Relation Age of Onset  . Cancer Mother     Social History Social History   Tobacco Use  . Smoking status: Current Some Day Smoker    Packs/day: 2.00  . Smokeless tobacco: Never Used  Vaping Use  . Vaping Use: Never used  Substance Use Topics  . Alcohol use: Yes    Alcohol/week: 2.0 standard drinks    Types: 2 Cans of beer per week    Comment: 2 beers twice a week  . Drug use: No    Review of Systems  Constitutional: Negative for fever. Eyes: Negative for visual changes. ENT: Negative for sore throat. Neck: No neck pain  Cardiovascular: Negative for chest pain. Respiratory: Negative for shortness of breath. Gastrointestinal: + lower abdominal pain. No vomiting or diarrhea. Genitourinary: + dysuria. Musculoskeletal: Negative for back pain. + R shoulder pain Skin: Negative for rash. Neurological: Negative for headaches, weakness or numbness. + seizure Psych: No SI or HI  ____________________________________________   PHYSICAL EXAM:  VITAL SIGNS: ED Triage Vitals  Enc Vitals Group     BP 04/28/20 2223 (!) 166/84     Pulse Rate 04/28/20 2223 65     Resp 04/28/20 2223 17     Temp 04/28/20 2223 98.3 F (36.8 C)     Temp Source 04/28/20 2223 Oral     SpO2 04/28/20 2223 97 %     Weight 04/28/20 2219 130 lb (59 kg)     Height 04/28/20 2219 5\' 6"  (1.676 m)     Head Circumference --      Peak Flow --      Pain Score 04/28/20 2218  8     Pain Loc --      Pain Edu? --      Excl. in [place]? --     Constitutional: Alert and oriented. Well appearing and in no apparent distress. HEENT:      Head: Normocephalic and atraumatic.         Eyes: Conjunctivae are normal. Sclera is non-icteric.       Mouth/Throat: Mucous membranes are moist.       Neck: Supple with no signs of meningismus.  No C-spine tenderness Cardiovascular: Regular rate and rhythm. No murmurs, gallops, or rubs. Respiratory: Normal respiratory effort. Lungs  are clear to auscultation bilaterally. No wheezes, crackles, or rhonchi.  Gastrointestinal: Soft, tender to palpation the left lower quadrant with no rebound or guarding,  Musculoskeletal: Diffusely tender to palpation on the right shoulder with no obvious deformity, dislocation, swelling or erythema. No edema, cyanosis, or erythema of extremities. Neurologic: Normal speech and language. Face is symmetric. Moving all extremities. No gross focal neurologic deficits are appreciated. Skin: Skin is warm, dry and intact. No rash noted. Psychiatric: Mood and affect are normal. Speech and behavior are normal.  ____________________________________________   LABS (all labs ordered are listed, but only abnormal results are displayed)  Labs Reviewed  URINALYSIS, COMPLETE (UACMP) WITH MICROSCOPIC - Abnormal; Notable for the following components:      Result Value   Color, Urine YELLOW (*)    APPearance CLEAR (*)    All other components within normal limits  LIPASE, BLOOD - Abnormal; Notable for the following components:   Lipase 88 (*)    All other components within normal limits  CBC WITH DIFFERENTIAL/PLATELET - Abnormal; Notable for the following components:   HCT 37.8 (*)    MCHC 36.5 (*)    Platelets 121 (*)    All other components within normal limits  TROPONIN I (HIGH SENSITIVITY) - Abnormal; Notable for the following components:   Troponin I (High Sensitivity) 21 (*)    All other components within  normal limits  COMPREHENSIVE METABOLIC PANEL  PROTIME-INR  ETHANOL  TROPONIN I (HIGH SENSITIVITY)   ____________________________________________  EKG  ED ECG REPORT I, Rudene Re, the attending physician, personally viewed and interpreted this ECG.  Sinus bradycardia, rate of 58, normal intervals, LVH, no ST elevations or depressions.  Unchanged from prior. ____________________________________________  RADIOLOGY  I have personally reviewed the images performed during this visit and I agree with the Radiologist's read.   Interpretation by Radiologist:  DG Chest 2 View  Result Date: 04/28/2020 CLINICAL DATA:  Seizure EXAM: CHEST - 2 VIEW COMPARISON:  06/28/2018 FINDINGS: Hyperinflation. No focal opacity or pleural effusion. Normal cardiomediastinal silhouette with aortic atherosclerosis. No pneumothorax. IMPRESSION: No active cardiopulmonary disease. Electronically Signed   By: Donavan Foil M.D.   On: 04/28/2020 22:44   DG Shoulder Right  Result Date: 04/29/2020 CLINICAL DATA:  Initial evaluation for acute right shoulder pain. EXAM: RIGHT SHOULDER - 2+ VIEW COMPARISON:  None. FINDINGS: No acute fracture dislocation. Mild osteoarthritic changes noted about the shoulder. No visible soft tissue injury. Visualized right hemithorax is clear. IMPRESSION: No acute osseous abnormality about the shoulder. Electronically Signed   By: Jeannine Boga M.D.   On: 04/29/2020 03:51   CT ABDOMEN PELVIS W CONTRAST  Result Date: 04/29/2020 CLINICAL DATA:  Left lower quadrant pain hematuria EXAM: CT ABDOMEN AND PELVIS WITH CONTRAST TECHNIQUE: Multidetector CT imaging of the abdomen and pelvis was performed using the standard protocol following bolus administration of intravenous contrast. CONTRAST:  167mL OMNIPAQUE IOHEXOL 300 MG/ML  SOLN COMPARISON:  February 13, 2019 FINDINGS: Lower chest: The visualized heart size within normal limits. No pericardial fluid/thickening. No hiatal hernia. The  visualized portions of the lungs are clear. Hepatobiliary: The liver is normal in density without focal abnormality.The main portal vein is patent. No evidence of calcified gallstones, gallbladder wall thickening or biliary dilatation. Pancreas: Unremarkable. No pancreatic ductal dilatation or surrounding inflammatory changes. Spleen: Normal in size without focal abnormality. Adrenals/Urinary Tract: Both adrenal glands appear normal. The kidneys and collecting system appear normal without evidence of urinary tract calculus or hydronephrosis. Bladder is unremarkable.  Stomach/Bowel: The stomach, small bowel, and colon are normal in appearance. No inflammatory changes, wall thickening, or obstructive findings.The appendix is normal. Vascular/Lymphatic: There are no enlarged mesenteric, retroperitoneal, or pelvic lymph nodes. Scattered aortic atherosclerotic calcifications are seen without aneurysmal dilatation. Reproductive: The patient is status post prostatectomy. Other: No evidence of abdominal wall mass or hernia. Musculoskeletal: No acute or significant osseous findings. IMPRESSION: No renal or collecting system calculi.  No hydronephrosis. Mild pancreatic ductal dilatation. No definite common bile duct stone is seen. No other acute intra-abdominal or pelvic pathology to explain the patient's symptoms. Aortic Atherosclerosis (ICD10-I70.0). Electronically Signed   By: Prudencio Pair M.D.   On: 04/29/2020 05:14     ____________________________________________   PROCEDURES  Procedure(s) performed:yes .1-3 Lead EKG Interpretation Performed by: Rudene Re, MD Authorized by: Rudene Re, MD     Interpretation: non-specific     ECG rate assessment: bradycardic     Rhythm: sinus bradycardia     Ectopy: none     Critical Care performed:  None ____________________________________________   INITIAL IMPRESSION / ASSESSMENT AND PLAN / ED COURSE  64 y.o. male the history of prostate cancer  status post resection, diabetes, GERD, peptic ulcer disease, hypertension, seizures who presents for evaluation of abdominal pain, seizures, and right shoulder pain.  Patient looks uncomfortable due to pain in his abdomen and right shoulder.  No signs of trauma that occurred during the seizure.  Looks clinically sober.  Normal vital signs.  Abdomen is tender to palpation on the left lower quadrant.  EKG with no acute changes.  Patient placed on telemetry for close monitoring.  Old medical records reviewed.  # abd pain: Differential diagnoses including diverticulitis, recurrence of prostate cancer, prostatitis, UTI, pyelonephritis, kidney stones.  We will get a CT abdomen pelvis.  UA is negative for UTI.  Possibly prostatitis.  #Seizure: Patient has never been seen by neurology and therefore never been started on any antiseizure medications.  Patient does not plan on following up with a neurologist.  Therefore we will start him on Keppra to prevent him from having other seizures.  #Shoulder pain: Acute on chronic.  Possibly worsened by seizures.  We will get an x-ray.  No signs of septic joint or dislocation on exam.  _________________________ 6:23 AM on 04/29/2020 -----------------------------------------  CT negative for any acute findings, confirmed by radiology.  Unlikely prostatitis as CT shows a total prostatectomy.  UA negative for UTI.  Shoulder x-ray negative for any acute findings.  Will discharge patient on Keppra and refer him to primary care.  Discussed my standard return precautions. Patient now reports that he cannot be discharged and that he needs to speak with a psychiatrist.  When I asked him why he says that " I am thinking about hurting myself."  We will consult psychiatry.  The patient has been placed in psychiatric observation due to the need to provide a safe environment for the patient while obtaining psychiatric consultation and evaluation, as well as ongoing medical and  medication management to treat the patient's condition.  The patient has not been placed under full IVC at this time.      _____________________________________________ Please note:  Patient was evaluated in Emergency Department today for the symptoms described in the history of present illness. Patient was evaluated in the context of the global COVID-19 pandemic, which necessitated consideration that the patient might be at risk for infection with the SARS-CoV-2 virus that causes COVID-19. Institutional protocols and algorithms that pertain to the  evaluation of patients at risk for COVID-19 are in a state of rapid change based on information released by regulatory bodies including the CDC and federal and state organizations. These policies and algorithms were followed during the patient's care in the ED.  Some ED evaluations and interventions may be delayed as a result of limited staffing during the pandemic.   [place] Controlled Substance Database was reviewed by me. ____________________________________________   FINAL CLINICAL IMPRESSION(S) / ED DIAGNOSES   Final diagnoses:  Seizure ([place])  Chronic right shoulder pain  Lower abdominal pain  Suicidal ideation      NEW MEDICATIONS STARTED DURING THIS VISIT:  ED Discharge Orders         Ordered    levETIRAcetam (KEPPRA) 500 MG tablet  2 times daily     Discontinue  Reprint     04/29/20 0630           Note:  This document was prepared using Dragon voice recognition software and may include unintentional dictation errors.    Alfred Levins, [place], MD 04/29/20 0630

## 2020-04-29 NOTE — ED Notes (Signed)
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 Pt states last usage was yesterday around 15:00. Pt states daily alcohol use of about 3 beers daily. Pt states last drink yesterday and had about 4 beers.

## 2020-04-29 NOTE — ED Notes (Signed)
Pt dressed out into burgundy scrubs with this tech and [place], NT in the rm. Pt belongings consist of a flip phone, white socks, white tennis shoes, a black belt, a grey at, blue pants, grey shirt, white shirt, a yellow envelope with papers, a blue jacket, a new deck of cards, a black cell phone charger, an ace wrap, a pair of black sunglasses, a yellow comb, 24 yellow gel pills (pt states "they are vitamin D"), a pair of camouflage shorts, a wallet, burgundy shorts and blue plaid shorts. Pt cooperative while dressing out. Pt belongings placed into two pt belonging bags and labeled with pt name.

## 2020-04-29 NOTE — ED Notes (Signed)
Pt made aware that he has to get a covid swab. Pt pulled out his card where he has had both vaccines. This tech explained to pt that he still has to be swabbed for covid d/t our protocols. Pt not happy at this time.

## 2020-04-29 NOTE — BH Assessment (Addendum)
Assessment Note Alexander Avery is an 64 y.o. male who presented to [facility] ED voluntarily for treatment. Per triage note, Pt states mid lower abd pain for several months since having prostate surgery. Pt states he would also like to get in a rehab program for heroin. Pt states he would also like to be tested for TB due to cough, but denies hemoptysis. Pt denies fever.   During TTS assessment pt presented alert and oriented x 3, irritable but cooperative, and mood-congruent with affect. The patient does appear to be responding to internal and external stimuli. Neither is the patient presenting with any delusional thinking. Pt confirmed the information provided to triage RN. Pt identified his main complaint to be detox for SA (Heroine & alcohol). Pt reports to be currently homeless and in desperate need of SA help. Pt reports previously living with his sister and being unable to return to the home due to his SA. Pt reports his last use of substances to be last night (4 bags of heroine, unsure of how much alcohol). Pt denies a MH, INPT or OPT hx. Pt stated "I was just her 2 days ago and as soon as I left I went to get some heroine because I was in so pain". Pt reports a hx of withdrawal symptoms (pain, nausea, defecating on self) to be currently experiencing symptoms (pain, nausea). Pt denies a family hx of MH but reports his parents to have a hx of SA (deceased). Pt denies any current SI/HI/AH/VH and was unable to contract for safety. Pt stated "I really need to be placed somewhere for treatment, I need help". Pt refused to provide collateral information, stating "I have no one to provide you mam".   Per Dr. Janese Banks pt is recommended for overnight observation pending detox referral  Diagnosis: Substance abuse disorder  Past Medical History:  Past Medical History:  Diagnosis Date  . Anxiety   . Arthritis   . Cancer [facility])    prostate  . Cataract 2019  . Diabetes mellitus without complication ([place])   . GERD  (gastroesophageal reflux disease)   . Hernia of abdominal wall 2015  . History of stomach ulcers   . Hypertension   . Prostate cancer ([place])   . Seizures ([place])   . Sleep apnea     Past Surgical History:  Procedure Laterality Date  . CATARACT EXTRACTION W/PHACO Right 03/30/2018   Procedure: CATARACT EXTRACTION PHACO AND INTRAOCULAR LENS PLACEMENT (IOC);  Surgeon: Eulogio Bear, MD;  Location: [facility] ORS;  Service: Ophthalmology;  Laterality: Right;  [place] 01:09.5 AP% 10.4 CDE 7.98 Fluid pack lot # [id number]  . CATARACT EXTRACTION W/PHACO Left 06/29/2018   Procedure: CATARACT EXTRACTION PHACO AND INTRAOCULAR LENS PLACEMENT (IOC);  Surgeon: Eulogio Bear, MD;  Location: [facility] ORS;  Service: Ophthalmology;  Laterality: Left;  [place] 01:27.7 AP% 8.2 CDE 7.27 Fluid pack lot # [id number]  . EYE SURGERY    . HERNIA REPAIR    . PROSTATE SURGERY    . PROSTATE SURGERY  2016    Family History:  Family History  Problem Relation Age of Onset  . Cancer Mother     Social History:  reports that he has been smoking. He has been smoking about 2.00 packs per day. He has never used smokeless tobacco. He reports current alcohol use of about 2.0 standard drinks of alcohol per week. He reports that he does not use drugs.  Additional Social History:  Alcohol / Drug Use Pain Medications: see  mar Prescriptions: see mar Over the Counter: see mar History of alcohol / drug use?: Yes Substance #1 Name of Substance 1: Heroine Substance #2 Name of Substance 2: alcohol  CIWA: CIWA-Ar BP: (!) 183/91 Pulse Rate: 85 COWS:    Allergies:  Allergies  Allergen Reactions  . Penicillins Hives and Other (See Comments)    Urinate on self, pass out Has patient had a PCN reaction causing immediate rash, facial/tongue/throat swelling, SOB or lightheadedness with hypotension: Yes Has patient had a PCN reaction causing severe rash involving mucus membranes or skin necrosis: No Has patient had a PCN reaction that  required hospitalization: Yes- In hospital Has patient had a PCN reaction occurring within the last 10 years: Yes If all of the above answers are "NO", then may proceed with Cephalosporin use.     Home Medications: (Not in a hospital admission)   OB/GYN Status:  No LMP for male patient.  General Assessment Data Location of Assessment: [facility] ED TTS Assessment: In system Is this a Tele or Face-to-Face Assessment?: Face-to-Face Is this an Initial Assessment or a Re-assessment for this encounter?: Initial Assessment Patient Accompanied by:: N/A Language Other than English: No Living Arrangements: Homeless/Shelter What gender do you identify as?: Male Marital status: Single Maiden name: n/a Pregnancy Status: No Living Arrangements: Other (Comment) (homeless) Can pt return to current living arrangement?: Yes Admission Status: Voluntary Is patient capable of signing voluntary admission?: Yes Referral Source: Self/Family/Friend Insurance type: [place] Screening Exam ([place]) Medical Exam completed: Yes  Crisis Care Plan Living Arrangements: Other (Comment) (homeless) Legal Guardian:  (self) Name of Psychiatrist: None reported  Name of Therapist: None reported   Education Status Is patient currently in school?: No Is the patient employed, unemployed or receiving disability?: Receiving disability income  Risk to self with the past 6 months Suicidal Ideation: No Has patient been a risk to self within the past 6 months prior to admission? : No Suicidal Intent: No Has patient had any suicidal intent within the past 6 months prior to admission? : No Is patient at risk for suicide?: No Suicidal Plan?: No Has patient had any suicidal plan within the past 6 months prior to admission? : No Access to Means: No What has been your use of drugs/alcohol within the last 12 months?: heroine & alcohol  Previous Attempts/Gestures: No How many times?: 0 Other Self Harm  Risks: None reported  Triggers for Past Attempts: Unknown Intentional Self Injurious Behavior: None Family Suicide History: No Recent stressful life event(s): Other (Comment) (Homeless SA treatment ) Persecutory voices/beliefs?: No Depression: Yes Depression Symptoms: Feeling angry/irritable Substance abuse history and/or treatment for substance abuse?: Yes Suicide prevention information given to non-admitted patients: Not applicable  Risk to Others within the past 6 months Homicidal Ideation: No Does patient have any lifetime risk of violence toward others beyond the six months prior to admission? : No Thoughts of Harm to Others: No Current Homicidal Intent: No Current Homicidal Plan: No Access to Homicidal Means: No Identified Victim: n/a History of harm to others?: No Assessment of Violence: None Noted Violent Behavior Description: n/a Does patient have access to weapons?: No Criminal Charges Pending?: No Does patient have a court date: No Is patient on probation?: No  Psychosis Hallucinations: None noted Delusions: None noted  Mental Status Report Appearance/Hygiene: In scrubs Eye Contact: Fair Motor Activity: Freedom of movement Speech: Logical/coherent Level of Consciousness: Quiet/awake, Irritable Mood: Depressed, Anxious, Irritable, Pleasant Affect: Anxious, Depressed, Irritable Anxiety Level: Minimal  Thought Processes: Coherent, Relevant Judgement: Partial Orientation: Appropriate for developmental age Obsessive Compulsive Thoughts/Behaviors: None  Cognitive Functioning Concentration: Fair Memory: Recent Intact, Remote Intact Is patient IDD: No Insight: Fair Impulse Control: Fair Appetite: Poor Have you had any weight changes? : No Change Sleep: Decreased Total Hours of Sleep:  (pt reports less than 6) Vegetative Symptoms: None     Prior Inpatient Therapy Prior Inpatient Therapy: No  Prior Outpatient Therapy Prior Outpatient Therapy: No Does  patient have an ACCT team?: No Does patient have Intensive In-House Services?  : No Does patient have [facility] services? : No Does patient have P4CC services?: No  ADL Screening (condition at time of admission) Is the patient deaf or have difficulty hearing?: No Does the patient have difficulty seeing, even when wearing glasses/contacts?: No Does the patient have difficulty concentrating, remembering, or making decisions?: No Does the patient have difficulty dressing or bathing?: No Does the patient have difficulty walking or climbing stairs?: No Weakness of Legs: None Weakness of Arms/Hands: None  Home Assistive Devices/Equipment Home Assistive Devices/Equipment: None  Therapy Consults (therapy consults require a physician order) PT Evaluation Needed: No OT Evalulation Needed: No SLP Evaluation Needed: No Abuse/Neglect Assessment (Assessment to be complete while patient is alone) Abuse/Neglect Assessment Can Be Completed: Yes Physical Abuse: Denies Verbal Abuse: Denies Sexual Abuse: Denies Exploitation of patient/patient's resources: Denies Self-Neglect: Denies Values / Beliefs Cultural Requests During Hospitalization: None Spiritual Requests During Hospitalization: None Consults Spiritual Care Consult Needed: No Transition of Care Team Consult Needed: No Advance Directives (For Healthcare) Does Patient Have a Medical Advance Directive?: No          Disposition:  Disposition Initial Assessment Completed for this Encounter: Yes Patient referred to: Other (Comment)  On Site Evaluation by:   Reviewed with Physician:    Shanon Ace 04/29/2020 3:27 PM

## 2020-04-30 DIAGNOSIS — R569 Unspecified convulsions: Secondary | ICD-10-CM | POA: Diagnosis not present

## 2020-04-30 NOTE — BH Assessment (Signed)
This Probation officer spoke with patient again regarding wanting to leave, patient again reported that he would like to leave in the morning 05/01/20 due to his apartment and is no longer requesting detox. Writer communicated this with [employer].

## 2020-04-30 NOTE — BH Assessment (Signed)
Referral check:   [facility] [phone]) Left a voicemail   [place] [phone] or [phone]) No answer   [facility] [phone]) Left a vociemail   [facility] [phone] -or- [phone]), Tammy reports under review   [facility] [phone]) Clair Gulling reports under review    [facility] [phone]) Bethena Roys request for pt to call in for screening before accepting/denying [phone])

## 2020-04-30 NOTE — ED Provider Notes (Signed)
Emergency Medicine Observation Re-evaluation Note  Alexander Avery is a 64 y.o. male, seen on rounds today.  Pt initially presented to the ED for complaints of Seizures and Shoulder Pain  Initially presented for evaluation of abdominal pain, seizures, right shoulder pain.  This medical work-up was negative.  Started on Keppra.  Later disclosed that he was thinking about hurting himself.  Currently, the patient is resting in bed.  Physical Exam  BP (!) 145/65 (BP Location: Right Arm)    Pulse (!) 53    Temp 98.5 F (36.9 C) (Oral)    Resp 16    Ht 5\' 6"  (1.676 m)    Wt 59 kg    SpO2 97%    BMI 20.98 kg/m  Physical Exam  General: Resting comfortably Respiratory: Normal work of breathing MSK: No deformities  ED Course / MDM  I have reviewed the labs performed to date as well as medications administered while in observation.  No new labs in the last 24 hours.  CIWA and COWS ordered. Started on [place] by his initial ER provider for history of seizures. Plan  Current plan is for final psychiatric recommendations and disposition. Patient is not under full IVC at this time.   Lilia Pro., MD 04/30/20 1010

## 2020-04-30 NOTE — BH Assessment (Signed)
Referral information for Psychiatric Hospitalization faxed to;    [facility] [phone])   [facility] [phone] or [phone])   [facility] [phone]),    [place] [phone] -or- [phone]),    [facility] [phone])   [facility] [phone])

## 2020-04-30 NOTE — ED Notes (Signed)
Pt offered a shower but declined at this time. Per Donneta Romberg, RN pt can wait d/t CIWA protocol.

## 2020-04-30 NOTE — BH Assessment (Signed)
Pt completed screening for [facility] by phone and shortly after expressed to be no longer interested in INPT for SA and is currently requesting to go home to sort out his apartment situation.

## 2020-04-30 NOTE — ED Notes (Signed)
Family at bedside. 

## 2020-04-30 NOTE — ED Notes (Signed)
VOL  PENDING  PLACEMENT 

## 2020-04-30 NOTE — ED Notes (Signed)
Pt asleep, meal tray placed on chair in rm.

## 2020-04-30 NOTE — ED Notes (Signed)
Snack and drink provided. ?

## 2020-04-30 NOTE — ED Notes (Signed)
Hourly rounding reveals patient sleeping in room. No complaints, stable, in no acute distress. Q15 minute rounds and monitoring via [employer] to continue. 

## 2020-04-30 NOTE — ED Notes (Signed)
Pt asleep, lunch tray placed on chair in rm.

## 2020-05-01 NOTE — ED Provider Notes (Signed)
-----------------------------------------   6:42 AM on 05/01/2020 -----------------------------------------  Patient is here voluntarily.  They were attempting to place the patient into detox however he is now requesting to leave the emergency department this morning.  Patient's medical work-up has been largely nonrevealing besides a slight lipase elevation possibly indicating mild pancreatitis.   Harvest Dark, MD 05/01/20 [phone]

## 2020-05-01 NOTE — ED Notes (Signed)
Pt showered. Discharge teaching done and prescriptions reviewed, pt verbalized understanding. Pt given a bus pass per his request and given all his personal belongings. Given extra tray from refrigerator which he didn't eat. Escorted to lobby, ambulatory and in NAD.

## 2020-05-14 ENCOUNTER — Inpatient Hospital Stay: Payer: Medicare HMO | Attending: Radiation Oncology

## 2020-05-21 ENCOUNTER — Ambulatory Visit: Payer: Medicare HMO | Admitting: Radiation Oncology

## 2020-05-22 ENCOUNTER — Other Ambulatory Visit: Payer: Self-pay | Admitting: *Deleted

## 2020-05-23 ENCOUNTER — Inpatient Hospital Stay: Payer: Medicare HMO | Attending: Radiation Oncology

## 2020-05-23 ENCOUNTER — Ambulatory Visit
Admission: RE | Admit: 2020-05-23 | Discharge: 2020-05-23 | Disposition: A | Discharge status: Home / Self Care | Payer: Medicare HMO | Source: Ambulatory Visit | Attending: Radiation Oncology | Admitting: Radiation Oncology

## 2020-05-23 ENCOUNTER — Other Ambulatory Visit: Payer: Self-pay

## 2020-05-23 VITALS — BP 160/87 | HR 63 | Temp 96.8°F | Resp 16 | Wt 117.3 lb

## 2020-05-23 DIAGNOSIS — C61 Malignant neoplasm of prostate: Secondary | ICD-10-CM | POA: Insufficient documentation

## 2020-05-23 DIAGNOSIS — Z923 Personal history of irradiation: Secondary | ICD-10-CM | POA: Insufficient documentation

## 2020-05-23 LAB — PSA: Prostatic Specific Antigen: 0.11 ng/mL (ref 0.00–4.00)

## 2020-05-23 NOTE — Progress Notes (Signed)
Radiation Oncology Follow up Note  Name: Alexander Avery   Date:   05/23/2020 MRN:  [id number] DOB: 16-Jun-1956    This 64 y.o. male presents to the clinic today for 38-month follow-up status post salvage radiation therapy to his prostatic fossa status post robotic assisted prostatectomy in 2016 with positive margins and biochemical recurrence.  REFERRING PROVIDER: Danelle Berry, NP  HPI: Patient is a 64 year old male now out 10 months having completed salvage radiation therapy after biochemical recurrence status post prostatectomy in 2016 with positive margins.  Seen today in routine follow-up he continues to lose weight which is concerning.  He also has complains of burning in his abdomen which she dates back to his prostatectomy.  He specifically denies any urinary frequency urgency diarrhea or bone pain.  His most recent PSA back in January was less than 0.01 he had a PSA drawn today which we report separately..  Patient recently was seen in emergency room for numerous somatic complaints.  He had a CT scan which I have reviewed shows no renal collecting system calculi no hydronephrosis had mild pancreatic ductal dilatation no other acute intra-abdominal or pelvic pathology.  COMPLICATIONS OF TREATMENT: none  FOLLOW UP COMPLIANCE: keeps appointments   PHYSICAL EXAM:  BP (!) 160/87 (BP Location: Right Arm, Patient Position: Sitting, Cuff Size: Small)   Pulse 63   Temp (!) 96.8 F (36 C) (Tympanic)   Resp 16   Wt 117 lb 4.8 oz (53.2 kg)   BMI 18.93 kg/m  Thin male in NAD.  Well-developed well-nourished patient in NAD. HEENT reveals PERLA, EOMI, discs not visualized.  Oral cavity is clear. No oral mucosal lesions are identified. Neck is clear without evidence of cervical or supraclavicular adenopathy. Lungs are clear to A&P. Cardiac examination is essentially unremarkable with regular rate and rhythm without murmur rub or thrill. Abdomen is benign with no organomegaly or masses noted.  Motor sensory and DTR levels are equal and symmetric in the upper and lower extremities. Cranial nerves II through XII are grossly intact. Proprioception is intact. No peripheral adenopathy or edema is identified. No motor or sensory levels are noted. Crude visual fields are within normal range.  RADIOLOGY RESULTS: Abdominal CT scan reviewed  PLAN: Present time patient will have his PSA drawn today will report that separately last PSA showed less than 0.01 showing excellent biochemical control status post salvage radiation therapy.  He will continue with his family doctor work-up for some of his somatic complaints.  I have asked to see him back in 1 year for follow-up.  I would like to take this opportunity to thank you for allowing me to participate in the care of your patient.Noreene Filbert, MD

## 2020-05-29 ENCOUNTER — Telehealth: Payer: Self-pay | Admitting: *Deleted

## 2020-05-29 ENCOUNTER — Other Ambulatory Visit: Payer: Self-pay | Admitting: *Deleted

## 2020-05-29 DIAGNOSIS — C61 Malignant neoplasm of prostate: Secondary | ICD-10-CM

## 2020-05-29 NOTE — Telephone Encounter (Signed)
Call placed to patient to discuss continued weight loss. Patient informed that dietary referral has been placed so that we can follow up on his weight loss. Patient also encouraged to call PCP and make them aware of ongoing weight loss.

## 2020-05-29 NOTE — Telephone Encounter (Signed)
Patient called and reports that he was to get a call from Dr Baruch Gouty today with why he is losing weight and to give test results. I gave him his PSA level (0.11) as that was the only recent test I see in his chart. He said he still wants a call back as to why he is losing weight. Please return his call [phone]

## 2020-06-19 ENCOUNTER — Encounter (HOSPITAL_COMMUNITY): Payer: Self-pay

## 2020-06-19 ENCOUNTER — Emergency Department (HOSPITAL_COMMUNITY)
Admission: EM | Admit: 2020-06-19 | Discharge: 2020-06-19 | Disposition: A | Discharge status: Home / Self Care | Payer: Medicare HMO | Attending: Emergency Medicine | Admitting: Emergency Medicine

## 2020-06-19 ENCOUNTER — Other Ambulatory Visit: Payer: Self-pay

## 2020-06-19 DIAGNOSIS — F1910 Other psychoactive substance abuse, uncomplicated: Secondary | ICD-10-CM | POA: Insufficient documentation

## 2020-06-19 DIAGNOSIS — E119 Type 2 diabetes mellitus without complications: Secondary | ICD-10-CM | POA: Insufficient documentation

## 2020-06-19 DIAGNOSIS — F10939 Alcohol use, unspecified with withdrawal, unspecified: Secondary | ICD-10-CM | POA: Diagnosis present

## 2020-06-19 DIAGNOSIS — Z79899 Other long term (current) drug therapy: Secondary | ICD-10-CM | POA: Diagnosis not present

## 2020-06-19 DIAGNOSIS — Z7984 Long term (current) use of oral hypoglycemic drugs: Secondary | ICD-10-CM | POA: Insufficient documentation

## 2020-06-19 DIAGNOSIS — R6883 Chills (without fever): Secondary | ICD-10-CM | POA: Insufficient documentation

## 2020-06-19 DIAGNOSIS — F17200 Nicotine dependence, unspecified, uncomplicated: Secondary | ICD-10-CM | POA: Insufficient documentation

## 2020-06-19 DIAGNOSIS — Z880 Allergy status to penicillin: Secondary | ICD-10-CM | POA: Diagnosis not present

## 2020-06-19 DIAGNOSIS — I10 Essential (primary) hypertension: Secondary | ICD-10-CM | POA: Insufficient documentation

## 2020-06-19 DIAGNOSIS — R5383 Other fatigue: Secondary | ICD-10-CM | POA: Diagnosis not present

## 2020-06-19 DIAGNOSIS — F1010 Alcohol abuse, uncomplicated: Secondary | ICD-10-CM | POA: Diagnosis not present

## 2020-06-19 MED ORDER — ONDANSETRON 4 MG PO TBDP: 4.0000 mg | ORAL_TABLET | Freq: Once | ORAL | Status: DC

## 2020-06-19 NOTE — Social Work (Signed)
CSW met with Pt at bedside. CSW attempted to give Pt homelessness resources, Pt became argumentative stating that he only wanted detox.  CSW explained that there were no available beds tonight and gave substance use resource list and explained process for presenting to [facility] in the morning for evaluation. Pt called this CSW stupid and other colorful names. CSW also provided buspass. CSW updated EDP

## 2020-06-19 NOTE — ED Notes (Signed)
No answer for VS x1 

## 2020-06-19 NOTE — ED Notes (Signed)
No answer for VS x 2 

## 2020-06-19 NOTE — ED Triage Notes (Signed)
Pt arrives POV for eval of detox from heroin and ETOH. Pt reports normally drinks 4-5 quarts of beer a day and snorts heroin. Reports last use yesterday.

## 2020-06-19 NOTE — ED Provider Notes (Signed)
[facility] EMERGENCY DEPARTMENT Provider Note   CSN: [id number] Arrival date & time: 06/19/20  1215     History Chief Complaint  Patient presents with  . Detox    Alexander Avery is a 64 y.o. male.  HPI   Patient presents to the emergency department requesting detoxification.  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  He reports last use this past morning for both substances.  Patient recently homeless x1 week as he was kicked out of his family's home for drug/alcohol use.  Heroin use via snorting; no IVDU.  Patient also reports drinking 4 to 5 quarts of alcohol per day.  Endorses some nausea and vomiting.  Chills.  Denies any chest pain or shortness of breath.  Denies abdominal pain.  No sick contacts.       Past Medical History:  Diagnosis Date  . Anxiety   . Arthritis   . Cancer [facility])    prostate  . Cataract 2019  . Diabetes mellitus without complication ([place])   . GERD (gastroesophageal reflux disease)   . Hernia of abdominal wall 2015  . History of stomach ulcers   . Hypertension   . Prostate cancer ([place])   . Seizures ([place])   . Sleep apnea     Patient Active Problem List   Diagnosis Date Noted  . Polysubstance abuse ([place]) 04/18/2020  . Prostate cancer ([place]) 02/20/2019    Past Surgical History:  Procedure Laterality Date  . CATARACT EXTRACTION W/PHACO Right 03/30/2018   Procedure: CATARACT EXTRACTION PHACO AND INTRAOCULAR LENS PLACEMENT (IOC);  Surgeon: Eulogio Bear, MD;  Location: [facility] ORS;  Service: Ophthalmology;  Laterality: Right;  [place] 01:09.5 AP% 10.4 CDE 7.98 Fluid pack lot # [id number]  . CATARACT EXTRACTION W/PHACO Left 06/29/2018   Procedure: CATARACT EXTRACTION PHACO AND INTRAOCULAR LENS PLACEMENT (IOC);  Surgeon: Eulogio Bear, MD;  Location: [facility] ORS;  Service: Ophthalmology;  Laterality: Left;  [place] 01:27.7 AP% 8.2 CDE 7.27 Fluid pack lot # [id number]  . EYE SURGERY    . HERNIA REPAIR    . PROSTATE  SURGERY    . PROSTATE SURGERY  2016       Family History  Problem Relation Age of Onset  . Cancer Mother     Social History   Tobacco Use  . Smoking status: Current Some Day Smoker    Packs/day: 2.00  . Smokeless tobacco: Never Used  Vaping Use  . Vaping Use: Never used  Substance Use Topics  . Alcohol use: Yes    Alcohol/week: 2.0 standard drinks    Types: 2 Cans of beer per week    Comment: 2 beers twice a week  . Drug use: No    Home Medications Prior to Admission medications   Medication Sig Start Date End Date Taking? Authorizing Provider  acetaminophen (TYLENOL) 325 MG tablet Take 650 mg by mouth every 6 (six) hours as needed for moderate pain or headache.    [provider]  atorvastatin (LIPITOR) 40 MG tablet Take 40 mg by mouth at bedtime. 05/22/18   [provider]  canagliflozin (INVOKANA) 300 MG TABS tablet Take 300 mg by mouth daily before breakfast.    [provider]  Difluprednate (DUREZOL) 0.05 % EMUL Place 1 drop into the right eye 2 (two) times daily.     [provider]  hydrocortisone 2.5 % cream Apply topically 2 (two) times daily. 04/26/19   Noreene Filbert, MD  JARDIANCE  10 MG TABS tablet TAKE 1 TABLET BY MOUTH IN THE MORNING FOR DIABETES 02/08/19   [provider]  levETIRAcetam (KEPPRA) 500 MG tablet Take 1 tablet (500 mg total) by mouth 2 (two) times daily. 04/29/20   Rudene Re, MD  losartan-hydrochlorothiazide [facility]) 100-25 MG tablet Take 1 tablet by mouth daily.    [provider]  meclizine (ANTIVERT) 12.5 MG tablet Take 1 tablet (12.5 mg total) by mouth 2 (two) times daily as needed for dizziness. 06/28/18   Merlyn Lot, MD  metFORMIN (GLUCOPHAGE) 500 MG tablet Take 500 mg by mouth 2 (two) times daily with a meal.    [provider]  oxybutynin (DITROPAN-XL) 10 MG 24 hr tablet Take 1 tablet (10 mg total) by mouth daily. 11/22/19   [name] A, PA-C  sildenafil  (REVATIO) 20 MG tablet Take 3 to 5 tablets two hours before intercouse on an empty stomach.  Do not take with nitrates. 12/22/18   [name] A, PA-C  sildenafil (VIAGRA) 100 MG tablet Take 1 tablet (100 mg total) by mouth daily as needed for erectile dysfunction. Take two hours prior to intercourse on an empty stomach 01/31/20   [name] A, PA-C    Allergies    Penicillins  Review of Systems   Review of Systems  Constitutional: Positive for activity change, appetite change, chills and fatigue. Negative for fever.  HENT: Negative for ear pain and sore throat.   Eyes: Negative for pain and visual disturbance.  Respiratory: Negative for cough and shortness of breath.   Cardiovascular: Negative for chest pain and palpitations.  Gastrointestinal: Negative for abdominal pain and vomiting.  Genitourinary: Negative for dysuria and hematuria.  Musculoskeletal: Negative for arthralgias and back pain.  Skin: Negative for color change and rash.  Neurological: Negative for seizures and syncope.  All other systems reviewed and are negative.   Physical Exam Updated Vital Signs BP (!) 155/92   Pulse 65   Temp 97.9 F (36.6 C) (Oral)   Resp 15   Ht 5\' 6"  (1.676 m)   Wt 54 kg   SpO2 100%   BMI 19.21 kg/m   Physical Exam Vitals and nursing note reviewed.  Constitutional:      General: He is not in acute distress.    Appearance: Normal appearance. He is well-developed and normal weight. He is not ill-appearing or toxic-appearing.  HENT:     Head: Normocephalic and atraumatic.     Right Ear: Tympanic membrane normal.     Left Ear: Tympanic membrane normal.     Mouth/Throat:     Mouth: Mucous membranes are moist.     Pharynx: Oropharynx is clear.  Eyes:     Conjunctiva/sclera: Conjunctivae normal.  Cardiovascular:     Rate and Rhythm: Normal rate and regular rhythm.     Heart sounds: No murmur heard.   Pulmonary:     Effort: Pulmonary effort is normal. No respiratory  distress.     Breath sounds: Normal breath sounds.  Abdominal:     Palpations: Abdomen is soft.     Tenderness: There is no abdominal tenderness.  Musculoskeletal:     Cervical back: Normal range of motion and neck supple. No rigidity.  Skin:    General: Skin is warm and dry.     Capillary Refill: Capillary refill takes less than 2 seconds.  Neurological:     General: No focal deficit present.     Mental Status: He is alert and oriented to  person, place, and time. Mental status is at baseline.  Psychiatric:        Mood and Affect: Mood normal.        Behavior: Behavior normal.     ED Results / Procedures / Treatments   Labs (all labs ordered are listed, but only abnormal results are displayed) Labs Reviewed  RAPID URINE DRUG SCREEN, HOSP PERFORMED    EKG None  Radiology No results found.  Procedures Procedures (including critical care time)  Medications Ordered in ED Medications  ondansetron (ZOFRAN-ODT) disintegrating tablet 4 mg (4 mg Oral Refused 06/19/20 1804)    ED Course  Inmer D Lederman is a 64 y.o. male with PMHx listed that presents to the Emergency Department complaint of Detox  ED Course: Initial exam completed.   Well-appearing hemodynamically stable.  Nontoxic and afebrile.  Physical exam significant for age-appropriate 64 year old male with abdomen soft, nondistended, nonfocally tender, extremities perfused, no tremor noted on examination, resting comfortably, and no active SI/HI.  Initial differential includes drug intoxication/withdrawal, complication/withdrawal, suicidality, homicidality, acute decompensated psychiatric illness, and electrolyte abnormalities.  Based on clinical evaluation, no evidence of clinical intoxication or evidence of clinical withdrawal at this time.  Social work/case management consulted for community resources for both homelessness and substance abuse.  Patient was provided resources by social work/case management.  No active  withdrawal symptoms and no evidence of psychiatric decompensation at this time.  No suicidality or homicidality.  Patient will be discharged and asked to follow-up for rehabilitation.  Diagnostics Vital Signs: reviewed Labs: reviewed and significant findings discussed above Imaging: personally reviewed images interpreted by radiology EKG: reviewed Records: nursing notes along with previous records reviewed and pertinent data discussed   Consults:  none   Reevaluation/Disposition:  Upon reevaluation, patients symptoms stable. No active nausea/vomiting and ambulatory without assistance prior to discharge from the emergency department.    All questions answered.  Strict return precautions were discussed. Additionally we discussed establishing and/or following-up with primary care physician.  Patient and/or family was understanding and in agreement with today's assessment and plan.   Sherolyn Buba, MD Emergency Medicine, PGY-3   Note: Dragon medical dictation software was used in the creation of this note.    Final Clinical Impression(s) / ED Diagnoses Final diagnoses:  Substance abuse ([place])  Alcohol abuse    Rx / DC Orders ED Discharge Orders    None       Frann Rider, MD 06/19/20 2125    Davonna Belling, MD 06/19/20 2221

## 2020-06-19 NOTE — Discharge Instructions (Addendum)
[facility]) Monday - Friday 8am - 3pm          Sat & Sun 8am - 2pm [address] [place],  [address]   [phone]     [email or web address] [facility] offers among other critical resources: showers, laundry, barbershop, phone bank, mailroom, computer lab, medical clinic, gardens and a bike maintenance area.   [place]  (Men & women) [address] ([place] (Men/women/families) [address] [phone]   [facility] (Families with children) [phone] [address] ([place] ([place]) [place] [phone]   [facility] (Children ages 28-17) [address] [phone]   [facility]   (Women & children) [address] [employer] [phone]   [facility] (Women/substance abuse) [place]  [place] [phone]   [facility] (Men) [address]  [place] [phone]   [facility] (Men) [address] [phone]  [employer] (Women) [place]  [place] [phone]   [facility] (Single women & women with children) [address]  [place] [phone]  [facility] (Men/women/families) [address] [phone]    [facility]   (Domestic Violence shelter) [place] [phone]   [facility] (Men & women) [address]  [place] ([place] (Men) [address]  [employer] [phone] [place] (Men) [address] [phone]   [facility] (Single women & families) [address] [phone]  [facility] (Men/women & families) [place].    [place] [phone]    If you are at risk of losing your housing (throughout [facility]) call the [place] at [phone]. You may also contact 2-1-1, a FREE service of the [place] [address] that provides information about many resources including housing. Dial 211, or visit online at [employer]

## 2020-06-30 ENCOUNTER — Inpatient Hospital Stay: Payer: Medicare HMO | Attending: Radiation Oncology

## 2020-06-30 NOTE — Progress Notes (Addendum)
Nutrition  Patient was a no show for nutrition appointment today at 1:45pm.  Message sent to Dr Baruch Gouty to inform of no show.    Zakari Couchman B. Zenia Resides, [place], [place] Registered Dietitian [phone] (mobile)

## 2020-09-01 NOTE — Progress Notes (Deleted)
01/31/2019 9:53 AM   Alexander Avery Avery April 20, 1956 [id number]  Referring provider: Danelle Berry, NP [address] [place],  [place] [address]  No chief complaint on file.   HPI: Alexander Avery Avery is a 64 y.o. male with polysubstance abuse, prostate cancer, LU TS and ED who presents today for 6 months follow up.    Prostate cancer He had underwent a prostatectomy in [place] for prostate cancer.  He states he was told his cancer was in the early stage.  He states that that he has had three "scrapings" through his penis in the urology office the first year after his prostate surgery.  According to records received from [facility], he had iPSA 15.0 - DRE nodularity and induration in the left lobe.  Biopsy was performed on 02/05/2015.  His prostate was 20.5 grams PSA density of 0.73.  Gleason score 6 and 7 (3+4) involving all 6 biopsies zones.  RRP on 04/21/2015 -pathology showed a Gleason score 7 involving 25% of the gland with negative surgical margins.  3 weeks after prostatectomy patient went into urinary retention.  Cystoscopy performed in July 2016 noted anterior urethra was unremarkable and the vesicular urethral anastomosis was open.  PSA was undetectable in August 2016.  He underwent urethral dilation for a stricture at the vesicourethral anastomosis on November 2016 with Alexander Avery Avery sounds.  On cytoscopy with Dr. Bernardo Avery on 09/15/2018, no evidence of urethral stricture or bladder neck contracture.  He has had salvage radiation.  PSA 0.11 in 05/2020.  ***  BPH WITH LUTS  (prostate and/or bladder) IPSS score: 18    Previous score: 20/5   Previous PVR: 0 mL  He reports of mixed urinary symptoms including incontinence when he thinks about urinating. He states oxybutynin provided mild relief however not significant enough to not be bothersome. He reports of side effects including dry mouth and mild constipation.  He denies dysuria, gross hematuria, suprapubic pain, flank pain  and F/N. He reports of chills and hot flashes at night which is attributed to Lupron he was receiving a while back.  He is not interested in further use of medication or PT for kegal exercises.    Score:   1-7 Mild 8-19 Moderate 20-35 Severe  Erectile dysfunction SHIM score: ***   Previous SHIM score: 12 Main complaint: achieving erections since prostatectomy, Pt is satisfied with Viagra. Risk factors:  age, pelvic radiation, prostate cancer, DM, HTN, HLD, hypothyroidism, sleep apnea, stress, anxiety, alcohol abuse, heroin and smoking No painful erections or curvatures with his erections.    No longer having spontaneous erections He is not taking any medications for the heart.     Score: 1-7 Severe ED 8-11 Moderate ED 12-16 Mild-Moderate ED 17-21 Mild ED 22-25 No ED  PMH: Past Medical History:  Diagnosis Date  . Anxiety   . Arthritis   . Cancer [facility])    prostate  . Cataract 2019  . Diabetes mellitus without complication ([place])   . GERD (gastroesophageal reflux disease)   . Hernia of abdominal wall 2015  . History of stomach ulcers   . Hypertension   . Prostate cancer ([place])   . Seizures ([place])   . Sleep apnea     Surgical History: Past Surgical History:  Procedure Laterality Date  . CATARACT EXTRACTION W/PHACO Right 03/30/2018   Procedure: CATARACT EXTRACTION PHACO AND INTRAOCULAR LENS PLACEMENT (IOC);  Surgeon: Alexander Bear, MD;  Location: [facility] ORS;  Service: Ophthalmology;  Laterality: Right;  [place] 01:09.5 AP% 10.4 CDE 7.98 Fluid pack lot # [id number]  . CATARACT EXTRACTION W/PHACO Left 06/29/2018   Procedure: CATARACT EXTRACTION PHACO AND INTRAOCULAR LENS PLACEMENT (IOC);  Surgeon: Alexander Bear, MD;  Location: [facility] ORS;  Service: Ophthalmology;  Laterality: Left;  [place] 01:27.7 AP% 8.2 CDE 7.27 Fluid pack lot # [id number]  . EYE SURGERY    . HERNIA REPAIR    . PROSTATE SURGERY    . PROSTATE SURGERY  2016    Home Medications:  Allergies as of  09/02/2020      Reactions   Penicillins Hives, Other (See Comments)   Urinate on self, pass out Has patient had a PCN reaction causing immediate rash, facial/tongue/throat swelling, SOB or lightheadedness with hypotension: Yes Has patient had a PCN reaction causing severe rash involving mucus membranes or skin necrosis: No Has patient had a PCN reaction that required hospitalization: Yes- In hospital Has patient had a PCN reaction occurring within the last 10 years: Yes If all of the above answers are "NO", then may proceed with Cephalosporin use.      Medication List       Accurate as of September 01, 2020  9:53 AM. If you have any questions, ask your nurse or doctor.        acetaminophen 325 MG tablet Commonly known as: TYLENOL Take 650 mg by mouth every 6 (six) hours as needed for moderate pain or headache.   atorvastatin 40 MG tablet Commonly known as: LIPITOR Take 40 mg by mouth at bedtime.   Durezol 0.05 % Emul Generic drug: Difluprednate Place 1 drop into the right eye 2 (two) times daily.   hydrocortisone 2.5 % cream Apply topically 2 (two) times daily.   Invokana 300 MG Tabs tablet Generic drug: canagliflozin Take 300 mg by mouth daily before breakfast.   Jardiance 10 MG Tabs tablet Generic drug: empagliflozin TAKE 1 TABLET BY MOUTH IN THE MORNING FOR DIABETES   levETIRAcetam 500 MG tablet Commonly known as: Keppra Take 1 tablet (500 mg total) by mouth 2 (two) times daily.   losartan-hydrochlorothiazide 100-25 MG tablet Commonly known as: HYZAAR Take 1 tablet by mouth daily.   meclizine 12.5 MG tablet Commonly known as: ANTIVERT Take 1 tablet (12.5 mg total) by mouth 2 (two) times daily as needed for dizziness.   metFORMIN 500 MG tablet Commonly known as: GLUCOPHAGE Take 500 mg by mouth 2 (two) times daily with a meal.   oxybutynin 10 MG 24 hr tablet Commonly known as: DITROPAN-XL Take 1 tablet (10 mg total) by mouth daily.   sildenafil 100 MG  tablet Commonly known as: VIAGRA Take 1 tablet (100 mg total) by mouth daily as needed for erectile dysfunction. Take two hours prior to intercourse on an empty stomach   sildenafil 20 MG tablet Commonly known as: REVATIO Take 3 to 5 tablets two hours before intercouse on an empty stomach.  Do not take with nitrates.       Allergies:  Allergies  Allergen Reactions  . Penicillins Hives and Other (See Comments)    Urinate on self, pass out Has patient had a PCN reaction causing immediate rash, facial/tongue/throat swelling, SOB or lightheadedness with hypotension: Yes Has patient had a PCN reaction causing severe rash involving mucus membranes or skin necrosis: No Has patient had a PCN reaction that required hospitalization: Yes- In hospital Has patient had a PCN reaction occurring within the last 10 years: Yes If all of the above answers are "NO", then  may proceed with Cephalosporin use.     Family History: Family History  Problem Relation Age of Onset  . Cancer Mother     Social History:  reports that he has been smoking. He has been smoking about 2.00 packs per day. He has never used smokeless tobacco. He reports current alcohol use of about 2.0 standard drinks of alcohol per week. He reports that he does not use drugs.  ROS: For pertinent review of systems please refer to history of present illness  Physical Exam: There were no vitals taken for this visit.  Constitutional:  Well nourished. Alert and oriented, No acute distress. HEENT: [place] AT, moist mucus membranes.  Trachea midline Cardiovascular: No clubbing, cyanosis, or edema. Respiratory: Normal respiratory effort, no increased work of breathing. GI: Abdomen is soft, non tender, non distended, no abdominal masses. Liver and spleen not palpable.  No hernias appreciated.  Stool sample for occult testing is not indicated.   GU: No CVA tenderness.  No bladder fullness or masses.  Patient with circumcised/uncircumcised  phallus. ***Foreskin easily retracted***  Urethral meatus is patent.  No penile discharge. No penile lesions or rashes. Scrotum without lesions, cysts, rashes and/or edema.  Testicles are located scrotally bilaterally. No masses are appreciated in the testicles. Left and right epididymis are normal. Rectal: Patient with  normal sphincter tone. Anus and perineum without scarring or rashes. No rectal masses are appreciated. Prostate is approximately *** grams, *** nodules are appreciated. Seminal vesicles are normal. Skin: No rashes, bruises or suspicious lesions. Lymph: No inguinal adenopathy. Neurologic: Grossly intact, no focal deficits, moving all 4 extremities. Psychiatric: Normal mood and affect.  Laboratory Data: Lab Results  Component Value Date   CREATININE 1.16 04/29/2020   Component     Latest Ref Rng & Units 09/04/2018 12/19/2018 11/20/2019  Prostate Specific Ag, Serum     0.0 - 4.0 ng/mL 0.4 0.8 <0.1   Component     Latest Ref Rng & Units 05/23/2020  Prostatic Specific Antigen     0.00 - 4.00 ng/mL 0.11   I have reviewed the labs.  Assessment & Plan:    1. 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 LUTS IPSS score 18 it is stable. Continue conservative management, avoiding bladder irritants and timed voiding's Most bothersome symptoms is/are frequency and nocturia Myrbetriq effective, but cost prohibitive Pt not interested in up-titration of Oxybutynin or PT for kegal exercises   Will check PSA q 6 months.  Will f/u with Dr. Baruch Gouty in July and we will f/u with pt in 1 year for PSA/IPSS/SHIM/DRE/PVR  3. Erectile dysfunction SHIM score is 12, moderate ED Pt satisfied with Viagara; refills given RTC in 6 weeks for SHIM score   No follow-ups on file.  Alexander Avery Council, PA-C     [facility] [address]  [place], [place] [address] [phone]

## 2020-09-02 ENCOUNTER — Ambulatory Visit: Payer: Medicare HMO | Admitting: Urology

## 2020-09-02 DIAGNOSIS — C61 Malignant neoplasm of prostate: Secondary | ICD-10-CM

## 2020-09-02 DIAGNOSIS — N529 Male erectile dysfunction, unspecified: Secondary | ICD-10-CM

## 2020-09-02 DIAGNOSIS — R399 Unspecified symptoms and signs involving the genitourinary system: Secondary | ICD-10-CM

## 2020-09-03 ENCOUNTER — Encounter: Payer: Self-pay | Admitting: Urology

## 2020-11-03 ENCOUNTER — Telehealth: Payer: Self-pay | Admitting: *Deleted

## 2020-11-03 NOTE — Telephone Encounter (Signed)
Patient called answering services reporting that he has blood in his stools. He does not see Medical Oncology. Please advise

## 2020-11-03 NOTE — Telephone Encounter (Signed)
Dr. Baruch Gouty talked to patient and informed him to talk to his PCP, patient states that he has been calling [facility] and the phone just rings. I called them and got someone on the first ring. I spoke to the nurse and she said she would call the patient.

## 2020-11-04 ENCOUNTER — Telehealth: Payer: Self-pay | Admitting: *Deleted

## 2020-11-04 NOTE — Telephone Encounter (Signed)
Received call from PCP office regarding concern for rectal bleeding and increased blood pressure. Requested same day appointment for ongoing urinary issues, advised per [name], NP patient needs specialist to be evaluated for rectal bleeding and increased BP. Made appointment for ongoing urinary issues next available.

## 2020-11-17 NOTE — Progress Notes (Signed)
01/31/2019 8:16 PM   Alexander Avery 1956-09-30 [id number]  Referring provider: Danelle Berry, NP [address] [place],  [place] [address]  Chief Complaint  Patient presents with  . Benign Prostatic Hypertrophy   Urological history 1. Prostate cancer - diagnosed at [facility] - iPSA 15.0 - DRE nodularity and induration in the left lobe.  Biopsy was performed on 02/05/2015.  His prostate was 20.5 grams PSA density of 0.73.   Gleason score 6 and 7 (3+4) involving all 6 biopsies zones - RRP on 04/21/2015 -pathology showed a Gleason score 7 involving 25% of the gland with negative surgical margins - ADT 04/2019 - Completed salvage radiation therapy 07/2019 to his prostate  - PSA 0.11 in 05/2020  2. Urethral stricture - underwent urethral dilation for a stricture at the vesicourethral anastomosis on November 2016 with Leander Rams sounds - cytoscopy with Dr. Bernardo Heater on 09/15/2018, no evidence of urethral stricture or bladder neck contracture  3. 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 ED - SHIM 16 - contributing factors of age, pelvic radiation, prostate cancer, DM, HTN, HLD, hypothyroidism, sleep apnea, stress, anxiety, alcohol abuse and smoking - managed with sidenafil 100 mg on-demand-dosing    HPI: Alexander Avery is a 65 y.o. male who presents today with the complaints of urinary issues and rectal bleeding.    His main issues at this visit is bleeding from the rectum, stomach pains and feeling off balance.  He still continues to have urgent urination, urge incontinence and a weak urinary stream.  Patient denies any modifying or aggravating factors.  Patient denies any gross hematuria, dysuria or suprapubic/flank pain.  Patient denies any fevers, chills, nausea or vomiting.   UA nitrite positive, 11-30 WBCs and many bacteria.  PVR is 20 mL    IPSS    Row Name 11/18/20 1100          International Prostate Symptom Score   How often have you had the sensation of not emptying your bladder? Less than half the time     How often have you had to urinate less than every two hours? About half the time     How often have you found you stopped and started again several times when you urinated? More than half the time     How often have you found it difficult to postpone urination? More than half the time     How often have you had a weak urinary stream? More than half the time     How often have you had to strain to start urination? Almost always     How many times did you typically get up at night to urinate? 4 Times     Total IPSS Score 26           Quality of Life due to urinary symptoms   If you were to spend the rest of your life with your urinary condition just the way it is now how would you feel about that? Mostly Disatisfied            Score:   1-7 Mild 8-19 Moderate 20-35 Severe  Patient is not having spontaneous erections.  He denies any pain or curvature with erections.    SHIM    Row Name 11/18/20 1122         SHIM: Over the last 6 months:  How do you rate your confidence that you could get and keep an erection? Moderate     When you had erections with sexual stimulation, how often were your erections hard enough for penetration (entering your partner)? Most Times (much more than half the time)     During sexual intercourse, how often were you able to maintain your erection after you had penetrated (entered) your partner? Sometimes (about half the time)     During sexual intercourse, how difficult was it to maintain your erection to completion of intercourse? Difficult     When you attempted sexual intercourse, how often was it satisfactory for you? Sometimes (about half the time)           SHIM Total Score   SHIM 16            Score: 1-7 Severe ED 8-11 Moderate ED 12-16 Mild-Moderate ED 17-21 Mild ED 22-25 No ED  PMH: Past Medical  History:  Diagnosis Date  . Anxiety   . Arthritis   . Cancer [facility])    prostate  . Cataract 2019  . Diabetes mellitus without complication (HCC)   . GERD (gastroesophageal reflux disease)   . Hernia of abdominal wall 2015  . History of stomach ulcers   . Hypertension   . Prostate cancer (HCC)   . Seizures (HCC)   . Sleep apnea     Surgical History: Past Surgical History:  Procedure Laterality Date  . CATARACT EXTRACTION W/PHACO Right 03/30/2018   Procedure: CATARACT EXTRACTION PHACO AND INTRAOCULAR LENS PLACEMENT (IOC);  Surgeon: Nevada Crane, MD;  Location: [facility] ORS;  Service: Ophthalmology;  Laterality: Right;  [place] 01:09.5 AP% 10.4 CDE 7.98 Fluid pack lot # [id number]  . CATARACT EXTRACTION W/PHACO Left 06/29/2018   Procedure: CATARACT EXTRACTION PHACO AND INTRAOCULAR LENS PLACEMENT (IOC);  Surgeon: Nevada Crane, MD;  Location: [facility] ORS;  Service: Ophthalmology;  Laterality: Left;  [place] 01:27.7 AP% 8.2 CDE 7.27 Fluid pack lot # [id number]  . EYE SURGERY    . HERNIA REPAIR    . PROSTATE SURGERY    . PROSTATE SURGERY  2016    Home Medications:  Allergies as of 11/18/2020      Reactions   Penicillins Hives, Other (See Comments)   Urinate on self, pass out Has patient had a PCN reaction causing immediate rash, facial/tongue/throat swelling, SOB or lightheadedness with hypotension: Yes Has patient had a PCN reaction causing severe rash involving mucus membranes or skin necrosis: No Has patient had a PCN reaction that required hospitalization: Yes- In hospital Has patient had a PCN reaction occurring within the last 10 years: Yes If all of the above answers are "NO", then may proceed with Cephalosporin use.      Medication List       Accurate as of November 18, 2020 11:59 PM. If you have any questions, ask your nurse or doctor.        acetaminophen 325 MG tablet Commonly known as: TYLENOL Take 650 mg by mouth every 6 (six) hours as needed for moderate pain or  headache.   atorvastatin 40 MG tablet Commonly known as: LIPITOR Take 40 mg by mouth at bedtime.   canagliflozin 300 MG Tabs tablet Commonly known as: INVOKANA Take 300 mg by mouth daily before breakfast.   Difluprednate 0.05 % Emul Place 1 drop into the right eye 2 (two) times daily.   hydrocortisone 2.5 % cream Apply topically 2 (two) times daily.   Jardiance 10 MG  Tabs tablet Generic drug: empagliflozin TAKE 1 TABLET BY MOUTH IN THE MORNING FOR DIABETES   levETIRAcetam 500 MG tablet Commonly known as: Keppra Take 1 tablet (500 mg total) by mouth 2 (two) times daily.   losartan-hydrochlorothiazide 100-25 MG tablet Commonly known as: HYZAAR Take 1 tablet by mouth daily.   meclizine 12.5 MG tablet Commonly known as: ANTIVERT Take 1 tablet (12.5 mg total) by mouth 2 (two) times daily as needed for dizziness.   metFORMIN 500 MG tablet Commonly known as: GLUCOPHAGE Take 500 mg by mouth 2 (two) times daily with a meal.   oxybutynin 10 MG 24 hr tablet Commonly known as: DITROPAN-XL Take 1 tablet (10 mg total) by mouth daily.   sildenafil 100 MG tablet Commonly known as: VIAGRA Take 1 tablet (100 mg total) by mouth daily as needed for erectile dysfunction. Take two hours prior to intercourse on an empty stomach   sildenafil 20 MG tablet Commonly known as: REVATIO Take 3 to 5 tablets two hours before intercouse on an empty stomach.  Do not take with nitrates.       Allergies:  Allergies  Allergen Reactions  . Penicillins Hives and Other (See Comments)    Urinate on self, pass out Has patient had a PCN reaction causing immediate rash, facial/tongue/throat swelling, SOB or lightheadedness with hypotension: Yes Has patient had a PCN reaction causing severe rash involving mucus membranes or skin necrosis: No Has patient had a PCN reaction that required hospitalization: Yes- In hospital Has patient had a PCN reaction occurring within the last 10 years: Yes If all of  the above answers are "NO", then may proceed with Cephalosporin use.     Family History: Family History  Problem Relation Age of Onset  . Cancer Mother     Social History:  reports that he has been smoking. He has been smoking about 2.00 packs per day. He has never used smokeless tobacco. He reports current alcohol use of about 2.0 standard drinks of alcohol per week. He reports that he does not use drugs.  ROS: For pertinent review of systems please refer to history of present illness  Physical Exam: BP (!) 183/73   Pulse (!) 59   Ht 5\' 6"  (1.676 m)   Wt 121 lb (54.9 kg)   BMI 19.53 kg/m   Constitutional:  Well nourished. Alert and oriented, No acute distress. HEENT: [place] AT, mask in place.  Trachea midline Cardiovascular: No clubbing, cyanosis, or edema. Respiratory: Normal respiratory effort, no increased work of breathing. Neurologic: Grossly intact, no focal deficits, moving all 4 extremities. Psychiatric: Normal mood and affect.  Laboratory Data: Lab Results  Component Value Date   CREATININE 1.16 04/29/2020   Component     Latest Ref Rng & Units 09/04/2018 12/19/2018 11/20/2019  Prostate Specific Ag, Serum     0.0 - 4.0 ng/mL 0.4 0.8 <0.1   Component     Latest Ref Rng & Units 05/23/2020  Prostatic Specific Antigen     0.00 - 4.00 ng/mL 0.11   I have reviewed the labs.  Pertinent Imaging CLINICAL DATA:  Left lower quadrant pain hematuria  EXAM: CT ABDOMEN AND PELVIS WITH CONTRAST  TECHNIQUE: Multidetector CT imaging of the abdomen and pelvis was performed using the standard protocol following bolus administration of intravenous contrast.  CONTRAST:  161mL OMNIPAQUE IOHEXOL 300 MG/ML  SOLN  COMPARISON:  February 13, 2019  FINDINGS: Lower chest: The visualized heart size within normal limits. No pericardial fluid/thickening.  No  hiatal hernia.  The visualized portions of the lungs are clear.  Hepatobiliary: The liver is normal in density without  focal abnormality.The main portal vein is patent. No evidence of calcified gallstones, gallbladder wall thickening or biliary dilatation.  Pancreas: Unremarkable. No pancreatic ductal dilatation or surrounding inflammatory changes.  Spleen: Normal in size without focal abnormality.  Adrenals/Urinary Tract: Both adrenal glands appear normal. The kidneys and collecting system appear normal without evidence of urinary tract calculus or hydronephrosis. Bladder is unremarkable.  Stomach/Bowel: The stomach, small bowel, and colon are normal in appearance. No inflammatory changes, wall thickening, or obstructive findings.The appendix is normal.  Vascular/Lymphatic: There are no enlarged mesenteric, retroperitoneal, or pelvic lymph nodes. Scattered aortic atherosclerotic calcifications are seen without aneurysmal dilatation.  Reproductive: The patient is status post prostatectomy.  Other: No evidence of abdominal wall mass or hernia.  Musculoskeletal: No acute or significant osseous findings.  IMPRESSION: No renal or collecting system calculi.  No hydronephrosis.  Mild pancreatic ductal dilatation. No definite common bile duct stone is seen.  No other acute intra-abdominal or pelvic pathology to explain the patient's symptoms.  Aortic Atherosclerosis (ICD10-I70.0).   Electronically Signed   By: Prudencio Pair M.D.   On: 04/29/2020 05:14  Results for RAVIS, PINGITORE (MRN [id number]) as of 12/14/2020 19:44  Ref. Range 11/18/2020 11:22  Scan Result Unknown 20  I have independently reviewed the films.  See HPI.   Assessment & Plan:    1. Rectal bleeding  I explained to the patient that rectal bleeding is not managed by an urological specialist and I advised the patient to seek treatment in the ED for his rectal bleeding as he stated he is feeling off balance and I was concerned for symptomatic anemia.  He deferred stating "they won't do anything and just send me  home."  Reviewing his records, he had been seen in the emergency room and either left before being evaluated due to the long wait or left after he did not receive the treatment he felt he needed over the last year.  I then asked if he expressed his symptoms to his PCP and if so was a GI referral ordered.  He stated his PCP was mad at him and was not doing anything for him as well.  We then reached out to the PCPs office to see if a GI referral had been placed, but we were unable to reach anybody.  I then placed a referral for rectum bleeding to GI myself.  2. Prostate cancer PSA pending RTC in 6 months for PSA  3. LU TS IPSS score is 26/4, it is worsening Continue conservative management, avoiding bladder irritants and timed voiding's Most bothersome symptoms is/are urgency and urge incontinence UA grossly infected, will send for culture and prescribe antibiotic once culture results are available  4. Erectile dysfunction SHIM score is 16, it is improved Continue sildenafil 100 mg on demand dosing Patient to return in 6 months for [facility] IM and exam  Return for refer to GI.  [name], PA-C   [facility] [address]  [place], [place] [address] [phone]  I spent 40 minutes on the day of the encounter to include pre-visit record review, face-to-face time with the patient, and post-visit ordering of tests.  - attempting to contact PCP's office to see if they have placed a GI referral - explaining to patient the seriousness of the rectal bleeding

## 2020-11-18 ENCOUNTER — Ambulatory Visit (INDEPENDENT_AMBULATORY_CARE_PROVIDER_SITE_OTHER): Payer: Medicare HMO | Admitting: Urology

## 2020-11-18 ENCOUNTER — Other Ambulatory Visit: Payer: Self-pay

## 2020-11-18 ENCOUNTER — Encounter: Payer: Self-pay | Admitting: Urology

## 2020-11-18 VITALS — BP 183/73 | HR 59 | Ht 66.0 in | Wt 121.0 lb

## 2020-11-18 DIAGNOSIS — K625 Hemorrhage of anus and rectum: Secondary | ICD-10-CM | POA: Diagnosis not present

## 2020-11-18 DIAGNOSIS — N529 Male erectile dysfunction, unspecified: Secondary | ICD-10-CM

## 2020-11-18 DIAGNOSIS — C61 Malignant neoplasm of prostate: Secondary | ICD-10-CM

## 2020-11-18 DIAGNOSIS — R399 Unspecified symptoms and signs involving the genitourinary system: Secondary | ICD-10-CM

## 2020-11-18 LAB — URINALYSIS, COMPLETE
Bilirubin, UA: NEGATIVE
Glucose, UA: NEGATIVE
Ketones, UA: NEGATIVE
Leukocytes,UA: NEGATIVE
Nitrite, UA: POSITIVE — AB
Specific Gravity, UA: >1.03 — ABNORMAL HIGH (ref 1.005–1.030)
Urobilinogen, Ur: 0.2 mg/dL (ref 0.2–1.0)
pH, UA: 5.5 (ref 5.0–7.5)

## 2020-11-18 LAB — MICROSCOPIC EXAMINATION
Epithelial Cells (non renal): NONE SEEN /hpf (ref 0–10)
RBC, Urine: NONE SEEN /hpf (ref 0–2)

## 2020-11-18 LAB — BLADDER SCAN AMB NON-IMAGING: Scan Result: 20

## 2020-11-18 NOTE — Patient Instructions (Signed)
You need to see a GI for your rectal bleeding

## 2020-11-19 LAB — PSA: Prostate Specific Ag, Serum: 0.2 ng/mL (ref 0.0–4.0)

## 2020-11-20 LAB — CULTURE, URINE COMPREHENSIVE

## 2020-11-21 ENCOUNTER — Telehealth: Payer: Self-pay | Admitting: *Deleted

## 2020-11-21 ENCOUNTER — Other Ambulatory Visit: Payer: Self-pay | Admitting: *Deleted

## 2020-11-21 MED ORDER — SULFAMETHOXAZOLE-TRIMETHOPRIM 800-160 MG PO TABS
1.0000 | ORAL_TABLET | Freq: Two times a day (BID) | ORAL | 0 refills | Status: AC
Start: 2020-11-21 — End: 2020-11-28

## 2020-11-21 NOTE — Telephone Encounter (Signed)
-----   Message from Nori Riis, PA-C sent at 11/20/2020  3:11 PM EST ----- Please let Mr. Bihm know that his urine culture was positive for infection and we need to start him on Septra DS, twice daily x seven days.  We have not heard back from [place] office, so I will place a referral to GI for his rectal bleeding.

## 2020-12-04 NOTE — Telephone Encounter (Signed)
Tried all number and they are not working,

## 2020-12-18 ENCOUNTER — Telehealth: Payer: Self-pay | Admitting: Gastroenterology

## 2020-12-18 NOTE — Telephone Encounter (Signed)
Letter returned to sender

## 2020-12-21 ENCOUNTER — Encounter: Payer: Self-pay | Admitting: Urology

## 2020-12-21 NOTE — Progress Notes (Signed)
Letter sent.

## 2021-01-07 ENCOUNTER — Telehealth: Payer: Self-pay | Admitting: *Deleted

## 2021-01-07 NOTE — Telephone Encounter (Signed)
Patient called triage line to ask which provider seen here-provided information patient last seen [name], PA voiced understanding.

## 2021-01-26 ENCOUNTER — Other Ambulatory Visit: Payer: Self-pay

## 2021-01-26 DIAGNOSIS — C61 Malignant neoplasm of prostate: Secondary | ICD-10-CM

## 2021-01-27 ENCOUNTER — Encounter: Payer: Self-pay | Admitting: Urology

## 2021-01-27 ENCOUNTER — Other Ambulatory Visit: Payer: Self-pay

## 2021-02-02 NOTE — Progress Notes (Deleted)
01/31/2019 12:26 PM   Alexander Avery 10/09/1956 [id number]  Referring provider: Danelle Berry, NP [address] [place],  [place] [address]  No chief complaint on file.  Urological history: 1. Prostate cancer - diagnosed at [facility] - iPSA 15.0 - DRE nodularity and induration in the left lobe.  Biopsy was performed on 02/05/2015.  His prostate was 20.5 grams PSA density of 0.73.   Gleason score 6 and 7 (3+4) involving all 6 biopsies zones - RRP on 04/21/2015 -pathology showed a Gleason score 7 involving 25% of the gland with negative surgical margins - ADT 04/2019 - Completed salvage radiation therapy 07/2019 to his prostate  - PSA 0.2 in 11/2020  2. Urethral stricture - underwent urethral dilation for a stricture at the vesicourethral anastomosis on November 2016 with Leander Rams sounds - cytoscopy with Dr. Bernardo Heater on 09/15/2018, no evidence of urethral stricture or bladder neck contracture  3. 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 ED - SHIM 16 - contributing factors of age, pelvic radiation, prostate cancer, DM, HTN, HLD, hypothyroidism, sleep apnea, stress, anxiety, alcohol abuse and smoking - managed with sidenafil 100 mg on-demand-dosing    HPI: Alexander Avery is a 65 y.o. male who presents today with the complaints of urinary issues and rectal bleeding.    His main issues at this visit is bleeding from the rectum, stomach pains and feeling off balance.  He still continues to have urgent urination, urge incontinence and a weak urinary stream.  Patient denies any modifying or aggravating factors.  Patient denies any gross hematuria, dysuria or suprapubic/flank pain.  Patient denies any fevers, chills, nausea or vomiting.   UA nitrite positive, 11-30 WBCs and many bacteria.  PVR is 20 mL     Score:   1-7 Mild 8-19 Moderate 20-35 Severe  Patient is not having spontaneous  erections.  He denies any pain or curvature with erections.     Score: 1-7 Severe ED 8-11 Moderate ED 12-16 Mild-Moderate ED 17-21 Mild ED 22-25 No ED  PMH: Past Medical History:  Diagnosis Date  . Anxiety   . Arthritis   . Cancer [facility])    prostate  . Cataract 2019  . Diabetes mellitus without complication ([place])   . GERD (gastroesophageal reflux disease)   . Hernia of abdominal wall 2015  . History of stomach ulcers   . Hypertension   . Prostate cancer ([place])   . Seizures ([place])   . Sleep apnea     Surgical History: Past Surgical History:  Procedure Laterality Date  . CATARACT EXTRACTION W/PHACO Right 03/30/2018   Procedure: CATARACT EXTRACTION PHACO AND INTRAOCULAR LENS PLACEMENT (IOC);  Surgeon: Eulogio Bear, MD;  Location: [facility] ORS;  Service: Ophthalmology;  Laterality: Right;  [place] 01:09.5 AP% 10.4 CDE 7.98 Fluid pack lot # [id number]  . CATARACT EXTRACTION W/PHACO Left 06/29/2018   Procedure: CATARACT EXTRACTION PHACO AND INTRAOCULAR LENS PLACEMENT (IOC);  Surgeon: Eulogio Bear, MD;  Location: [facility] ORS;  Service: Ophthalmology;  Laterality: Left;  [place] 01:27.7 AP% 8.2 CDE 7.27 Fluid pack lot # [id number]  . EYE SURGERY    . HERNIA REPAIR    . PROSTATE SURGERY    . PROSTATE SURGERY  2016    Home Medications:  Allergies as of 02/03/2021      Reactions   Penicillins Hives, Other (See Comments)   Urinate on self,  pass out Has patient had a PCN reaction causing immediate rash, facial/tongue/throat swelling, SOB or lightheadedness with hypotension: Yes Has patient had a PCN reaction causing severe rash involving mucus membranes or skin necrosis: No Has patient had a PCN reaction that required hospitalization: Yes- In hospital Has patient had a PCN reaction occurring within the last 10 years: Yes If all of the above answers are "NO", then may proceed with Cephalosporin use.      Medication List       Accurate as of February 02, 2021 12:26 PM. If you have any  questions, ask your nurse or doctor.        acetaminophen 325 MG tablet Commonly known as: TYLENOL Take 650 mg by mouth every 6 (six) hours as needed for moderate pain or headache.   atorvastatin 40 MG tablet Commonly known as: LIPITOR Take 40 mg by mouth at bedtime.   canagliflozin 300 MG Tabs tablet Commonly known as: INVOKANA Take 300 mg by mouth daily before breakfast.   Difluprednate 0.05 % Emul Place 1 drop into the right eye 2 (two) times daily.   hydrocortisone 2.5 % cream Apply topically 2 (two) times daily.   Jardiance 10 MG Tabs tablet Generic drug: empagliflozin TAKE 1 TABLET BY MOUTH IN THE MORNING FOR DIABETES   levETIRAcetam 500 MG tablet Commonly known as: Keppra Take 1 tablet (500 mg total) by mouth 2 (two) times daily.   losartan-hydrochlorothiazide 100-25 MG tablet Commonly known as: HYZAAR Take 1 tablet by mouth daily.   meclizine 12.5 MG tablet Commonly known as: ANTIVERT Take 1 tablet (12.5 mg total) by mouth 2 (two) times daily as needed for dizziness.   metFORMIN 500 MG tablet Commonly known as: GLUCOPHAGE Take 500 mg by mouth 2 (two) times daily with a meal.   oxybutynin 10 MG 24 hr tablet Commonly known as: DITROPAN-XL Take 1 tablet (10 mg total) by mouth daily.   sildenafil 100 MG tablet Commonly known as: VIAGRA Take 1 tablet (100 mg total) by mouth daily as needed for erectile dysfunction. Take two hours prior to intercourse on an empty stomach   sildenafil 20 MG tablet Commonly known as: REVATIO Take 3 to 5 tablets two hours before intercouse on an empty stomach.  Do not take with nitrates.       Allergies:  Allergies  Allergen Reactions  . Penicillins Hives and Other (See Comments)    Urinate on self, pass out Has patient had a PCN reaction causing immediate rash, facial/tongue/throat swelling, SOB or lightheadedness with hypotension: Yes Has patient had a PCN reaction causing severe rash involving mucus membranes or skin  necrosis: No Has patient had a PCN reaction that required hospitalization: Yes- In hospital Has patient had a PCN reaction occurring within the last 10 years: Yes If all of the above answers are "NO", then may proceed with Cephalosporin use.     Family History: Family History  Problem Relation Age of Onset  . Cancer Mother     Social History:  reports that he has been smoking. He has been smoking about 2.00 packs per day. He has never used smokeless tobacco. He reports current alcohol use of about 2.0 standard drinks of alcohol per week. He reports that he does not use drugs.  ROS: For pertinent review of systems please refer to history of present illness  Physical Exam: There were no vitals taken for this visit.  Constitutional:  Well nourished. Alert and oriented, No acute distress. HEENT: [place] AT,  moist mucus membranes.  Trachea midline Cardiovascular: No clubbing, cyanosis, or edema. Respiratory: Normal respiratory effort, no increased work of breathing. GI: Abdomen is soft, non tender, non distended, no abdominal masses. Liver and spleen not palpable.  No hernias appreciated.  Stool sample for occult testing is not indicated.   GU: No CVA tenderness.  No bladder fullness or masses.  Patient with circumcised/uncircumcised phallus. ***Foreskin easily retracted***  Urethral meatus is patent.  No penile discharge. No penile lesions or rashes. Scrotum without lesions, cysts, rashes and/or edema.  Testicles are located scrotally bilaterally. No masses are appreciated in the testicles. Left and right epididymis are normal. Rectal: Patient with  normal sphincter tone. Anus and perineum without scarring or rashes. No rectal masses are appreciated. Prostate is approximately *** grams, *** nodules are appreciated. Seminal vesicles are normal. Skin: No rashes, bruises or suspicious lesions. Lymph: No inguinal adenopathy. Neurologic: Grossly intact, no focal deficits, moving all 4  extremities. Psychiatric: Normal mood and affect.   Laboratory Data: Component     Latest Ref Rng & Units 11/18/2020  Prostate Specific Ag, Serum     0.0 - 4.0 ng/mL 0.2  I have reviewed the labs.  Pertinent Imaging  Assessment & Plan:    1. Rectal bleeding  I explained to the patient that rectal bleeding is not managed by an urological specialist and I advised the patient to seek treatment in the ED for his rectal bleeding as he stated he is feeling off balance and I was concerned for symptomatic anemia.  He deferred stating "they won't do anything and just send me home."  Reviewing his records, he had been seen in the emergency room and either left before being evaluated due to the long wait or left after he did not receive the treatment he felt he needed over the last year.  I then asked if he expressed his symptoms to his PCP and if so was a GI referral ordered.  He stated his PCP was mad at him and was not doing anything for him as well.  We then reached out to the PCPs office to see if a GI referral had been placed, but we were unable to reach anybody.  I then placed a referral for rectum bleeding to GI myself.  2. Prostate cancer PSA pending RTC in 6 months for PSA  3. LU TS IPSS score is 26/4, it is worsening Continue conservative management, avoiding bladder irritants and timed voiding's Most bothersome symptoms is/are urgency and urge incontinence UA grossly infected, will send for culture and prescribe antibiotic once culture results are available  4. Erectile dysfunction SHIM score is 16, it is improved Continue sildenafil 100 mg on demand dosing Patient to return in 6 months for [facility] IM and exam  No follow-ups on file.  [name], PA-C   [facility] [address]  [place], [place] [address] [phone]  I spent 40 minutes on the day of the encounter to include pre-visit record review, face-to-face time with the patient,  and post-visit ordering of tests.  - attempting to contact PCP's office to see if they have placed a GI referral - explaining to patient the seriousness of the rectal bleeding

## 2021-02-03 ENCOUNTER — Ambulatory Visit: Payer: Self-pay | Admitting: Urology

## 2021-02-03 ENCOUNTER — Encounter: Payer: Self-pay | Admitting: Urology

## 2021-02-03 DIAGNOSIS — K625 Hemorrhage of anus and rectum: Secondary | ICD-10-CM

## 2021-05-22 ENCOUNTER — Inpatient Hospital Stay: Payer: Medicare HMO | Attending: Radiation Oncology

## 2021-05-22 ENCOUNTER — Ambulatory Visit: Payer: Medicaid Other | Admitting: Radiation Oncology

## 2021-07-25 IMAGING — CT CT HEAD W/O CM
4 series · 17 of 47 positions shown, 19 images · non-contrast
Comparison: 06/28/2018

CLINICAL DATA: Seizure, dizziness, intoxicated

EXAM:
CT HEAD WITHOUT CONTRAST
TECHNIQUE: Contiguous axial images were obtained from the base of the skull
through the vertex without intravenous contrast.

[Series 2: head wo · axial · 0.40mm/px · z∈[-33,+67]mm · 6 of 28 slices shown, 8 images]
[im 4/28  brain]
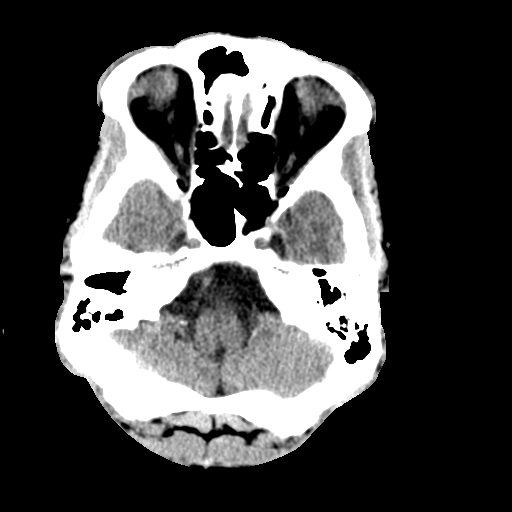
[im 4/28  bone]
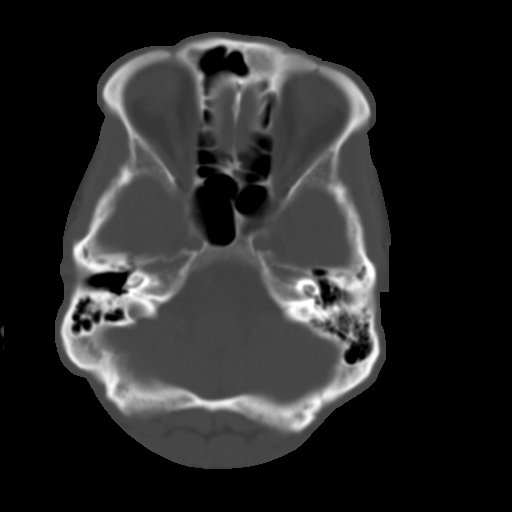
[im 8/28  brain]
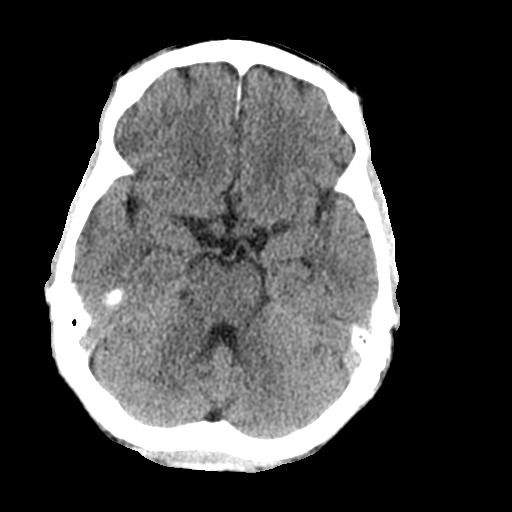
[im 12/28  brain]
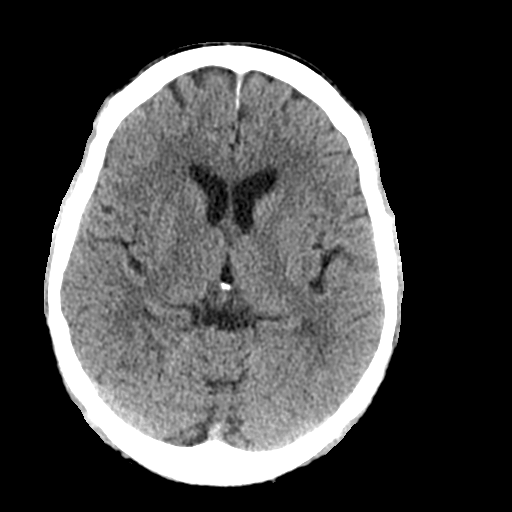
[im 16/28  brain]
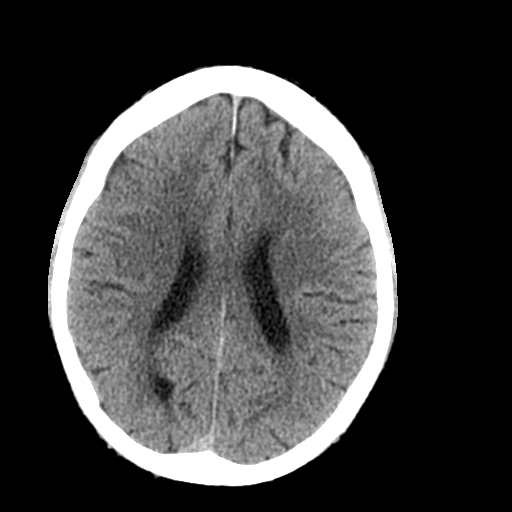
[im 20/28  brain]
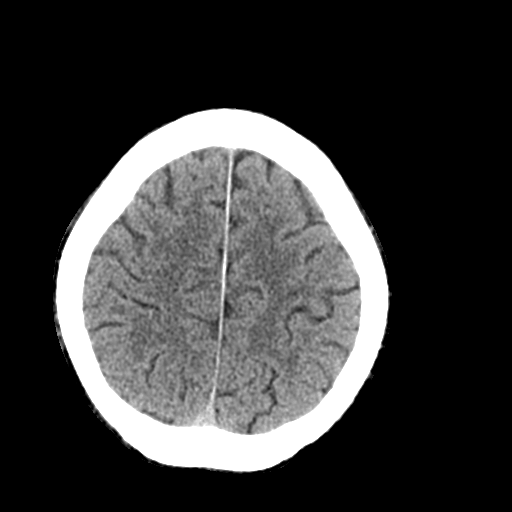
[im 20/28  bone]
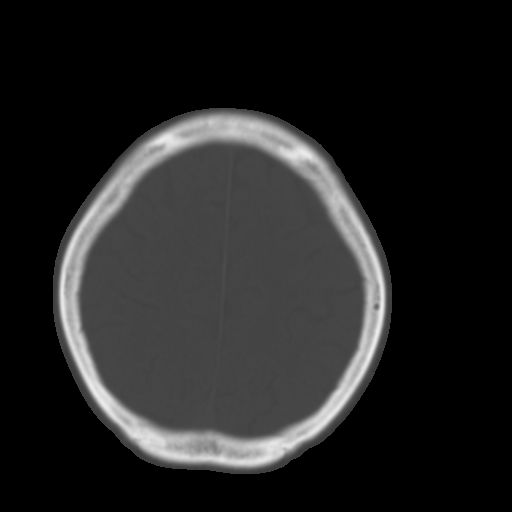
[im 24/28  brain]
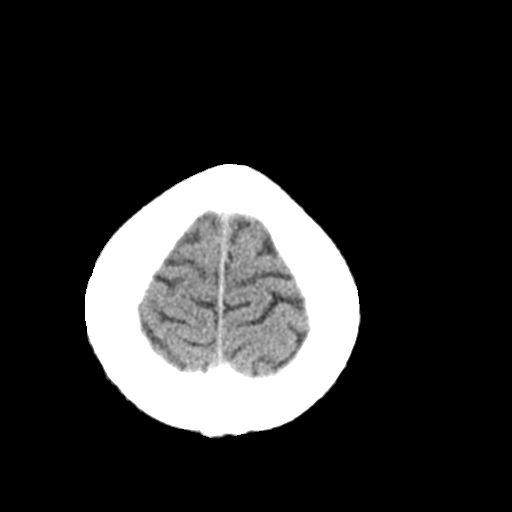

[Series 3: head bone · axial · 0.40mm/px · z∈[-36,+34]mm · 5 of 73 slices shown]
[im 7/73  bone]
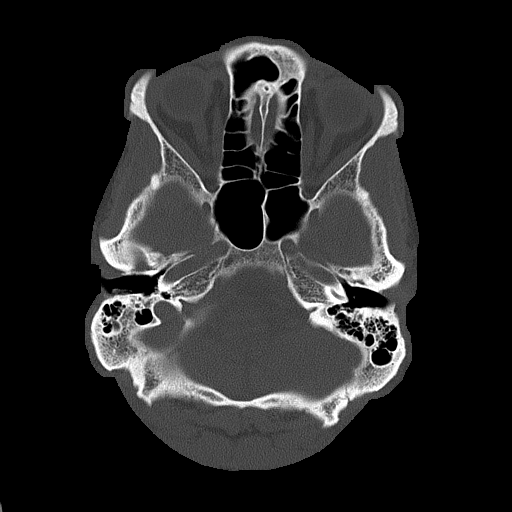
[im 14/73  bone]
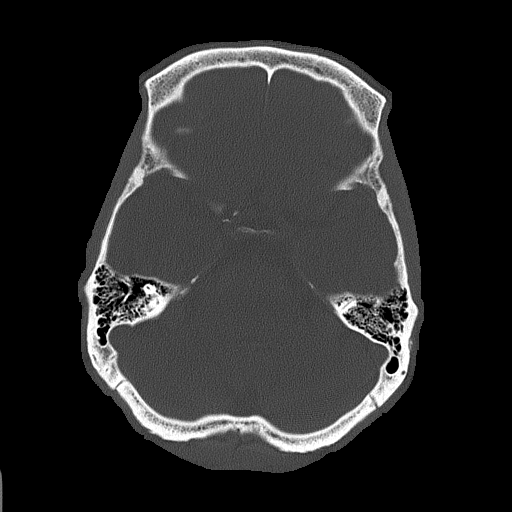
[im 25/73  bone]
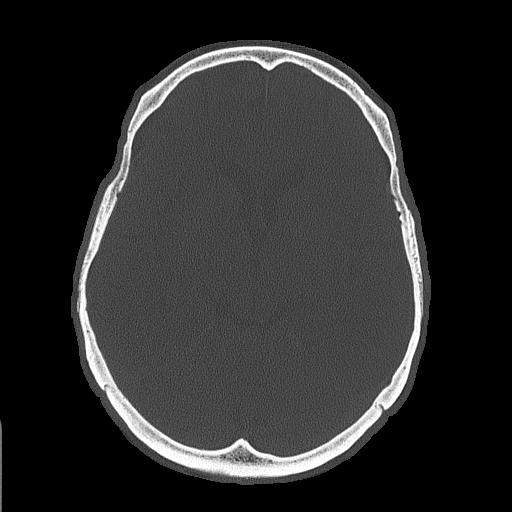
[im 31/73  bone]
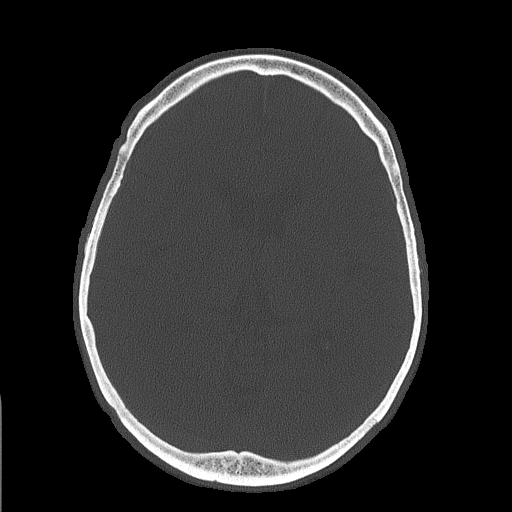
[im 42/73  bone]
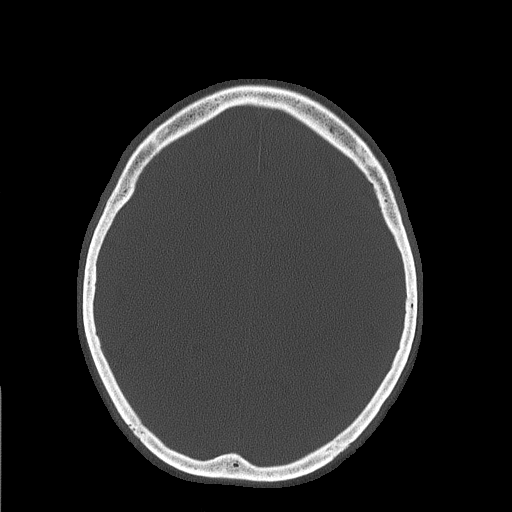

[Series 4: coronal soft tissue · coronal · 0.30mm/px · 3 of 60 slices shown]
[im 20/60  brain]
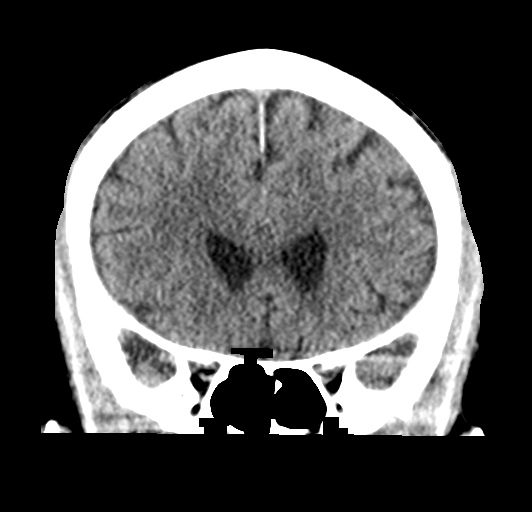
[im 27/60  brain]
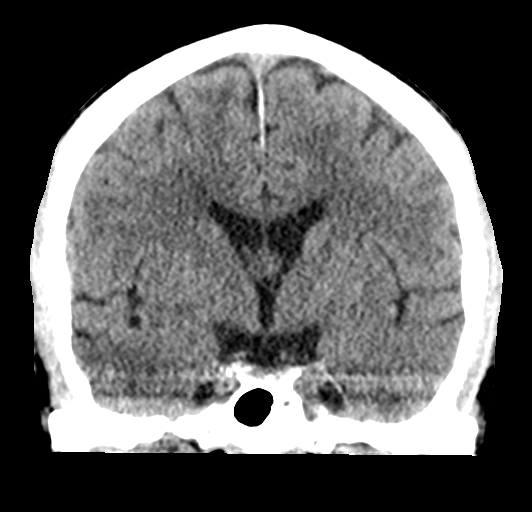
[im 33/60  brain]
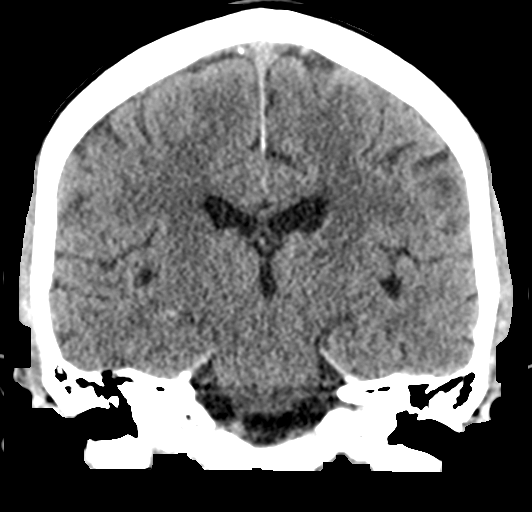

[Series 5: sagittal soft tissue · sagittal · 0.29mm/px · 3 of 48 slices shown]
[im 16/48  brain]
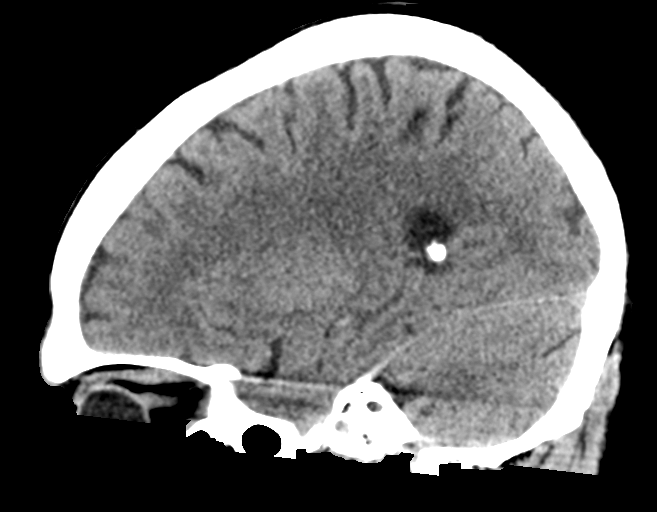
[im 24/48  brain]
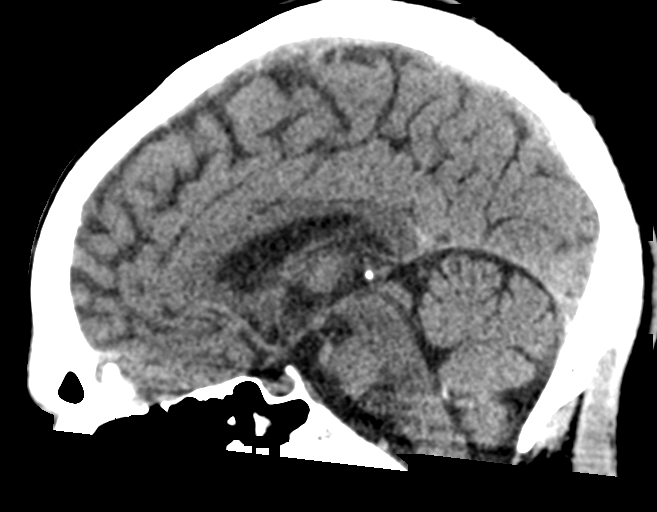
[im 32/48  brain]
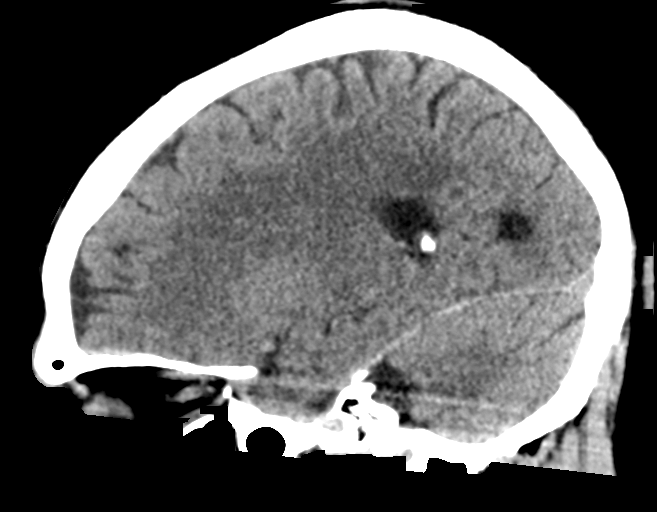

[17 of 47 positions shown; findings below may reference images not displayed]

FINDINGS: Brain: No acute infarct or hemorrhage. Lateral ventricles and
midline structures are stable. No acute extra-axial fluid
collections. No mass effect.

Vascular: No hyperdense vessel or unexpected calcification.

Skull: Normal. Negative for fracture or focal lesion.

Sinuses/Orbits: No acute finding.

Other: None.
IMPRESSION: 1. No acute intracranial process.

## 2021-10-26 ENCOUNTER — Other Ambulatory Visit: Payer: Self-pay

## 2021-10-26 ENCOUNTER — Emergency Department
Admission: EM | Admit: 2021-10-26 | Discharge: 2021-10-27 | Disposition: A | Discharge status: Other Acute Inpatient Hospital | Payer: Medicare HMO | Attending: Emergency Medicine | Admitting: Emergency Medicine

## 2021-10-26 DIAGNOSIS — I10 Essential (primary) hypertension: Secondary | ICD-10-CM | POA: Diagnosis not present

## 2021-10-26 DIAGNOSIS — K649 Unspecified hemorrhoids: Secondary | ICD-10-CM | POA: Diagnosis not present

## 2021-10-26 DIAGNOSIS — F1910 Other psychoactive substance abuse, uncomplicated: Secondary | ICD-10-CM | POA: Insufficient documentation

## 2021-10-26 DIAGNOSIS — F17210 Nicotine dependence, cigarettes, uncomplicated: Secondary | ICD-10-CM | POA: Insufficient documentation

## 2021-10-26 DIAGNOSIS — Z7984 Long term (current) use of oral hypoglycemic drugs: Secondary | ICD-10-CM | POA: Diagnosis not present

## 2021-10-26 DIAGNOSIS — F331 Major depressive disorder, recurrent, moderate: Secondary | ICD-10-CM | POA: Insufficient documentation

## 2021-10-26 DIAGNOSIS — Z8546 Personal history of malignant neoplasm of prostate: Secondary | ICD-10-CM | POA: Insufficient documentation

## 2021-10-26 DIAGNOSIS — E119 Type 2 diabetes mellitus without complications: Secondary | ICD-10-CM | POA: Diagnosis not present

## 2021-10-26 DIAGNOSIS — F32A Depression, unspecified: Secondary | ICD-10-CM

## 2021-10-26 DIAGNOSIS — C61 Malignant neoplasm of prostate: Secondary | ICD-10-CM | POA: Diagnosis present

## 2021-10-26 DIAGNOSIS — Z79899 Other long term (current) drug therapy: Secondary | ICD-10-CM | POA: Insufficient documentation

## 2021-10-26 DIAGNOSIS — Z20822 Contact with and (suspected) exposure to covid-19: Secondary | ICD-10-CM | POA: Diagnosis not present

## 2021-10-26 LAB — COMPREHENSIVE METABOLIC PANEL
ALT: 23 U/L (ref 0–44)
AST: 30 U/L (ref 15–41)
Albumin: 3.6 g/dL (ref 3.5–5.0)
Alkaline Phosphatase: 71 U/L (ref 38–126)
Anion gap: 10 (ref 5–15)
BUN: 17 mg/dL (ref 8–23)
CO2: 25 mmol/L (ref 22–32)
Calcium: 9.4 mg/dL (ref 8.9–10.3)
Chloride: 100 mmol/L (ref 98–111)
Creatinine, Ser: 1.03 mg/dL (ref 0.61–1.24)
GFR, Estimated: >60 mL/min (ref >60)
Glucose, Bld: 88 mg/dL (ref 70–99)
Potassium: 3.5 mmol/L (ref 3.5–5.1)
Sodium: 135 mmol/L (ref 135–145)
Total Bilirubin: 1 mg/dL (ref 0.3–1.2)
Total Protein: 8.2 g/dL — ABNORMAL HIGH (ref 6.5–8.1)

## 2021-10-26 LAB — CBC
HCT: 38.3 % — ABNORMAL LOW (ref 39.0–52.0)
Hemoglobin: 14 g/dL (ref 13.0–17.0)
MCH: 29.4 pg (ref 26.0–34.0)
MCHC: 36.6 g/dL — ABNORMAL HIGH (ref 30.0–36.0)
MCV: 80.5 fL (ref 80.0–100.0)
Platelets: 186 10*3/uL (ref 150–400)
RBC: 4.76 MIL/uL (ref 4.22–5.81)
RDW: 13.7 % (ref 11.5–15.5)
WBC: 8.4 10*3/uL (ref 4.0–10.5)
nRBC: 0 % (ref 0.0–0.2)

## 2021-10-26 LAB — TYPE AND SCREEN
ABO/RH(D): B POS
Antibody Screen: NEGATIVE

## 2021-10-26 MED ORDER — HYDROCORTISONE ACETATE 25 MG RE SUPP: 25.0000 mg | Freq: Two times a day (BID) | RECTAL | 1 refills | Status: DC

## 2021-10-26 MED ORDER — DICYCLOMINE HCL 10 MG PO CAPS
10.0000 mg | ORAL_CAPSULE | Freq: Once | ORAL | Status: AC
  Administered 2021-10-27: 10 mg via ORAL
  Filled 2021-10-26: qty 1

## 2021-10-26 NOTE — ED Triage Notes (Signed)
First Nurse Note:  Arrives via [facility] from home.  C/O lower abdominal pain x 2 days.  Patient noted bright red blood in stool today.  No History of GI bleeding.  Hx HTN, not currently on Meds.  194/96 62 RA Spo2 96% CBG:  109

## 2021-10-26 NOTE — ED Provider Notes (Signed)
[facility] Emergency Department Provider Note  ____________________________________________   I have reviewed the triage vital signs and the nursing notes.   HISTORY  Chief Complaint GI Bleeding   History limited by: Not Limited   HPI Lynx D Ragsdale is a 65 y.o. male who presents to the emergency department today with primary concern for GI bleeding. Patient states that three days ago first noticed some red blood after a bowel movement. Since then he says he has only been passing red blood. Has had some discomfort with it. The patient also is complaining of chronic pain to the incision site of his prostatectomy. Furthermore the patient states he has been feeling depressed and has been having suicidal thoughts.    Records reviewed. Per medical record review patient has a history of polysubstance abuse, depression.  Past Medical History:  Diagnosis Date   Anxiety    Arthritis    Cancer ([place])    prostate   Cataract 2019   Diabetes mellitus without complication (HCC)    GERD (gastroesophageal reflux disease)    Hernia of abdominal wall 2015   History of stomach ulcers    Hypertension    Prostate cancer ([place])    Seizures ([place])    Sleep apnea     Patient Active Problem List   Diagnosis Date Noted   Polysubstance abuse ([place]) 04/18/2020   Prostate cancer ([place]) 02/20/2019    Past Surgical History:  Procedure Laterality Date   CATARACT EXTRACTION W/PHACO Right 03/30/2018   Procedure: CATARACT EXTRACTION PHACO AND INTRAOCULAR LENS PLACEMENT (IOC);  Surgeon: Eulogio Bear, MD;  Location: [facility] ORS;  Service: Ophthalmology;  Laterality: Right;  [place] 01:09.5 AP% 10.4 CDE 7.98 Fluid pack lot # [id number]   CATARACT EXTRACTION W/PHACO Left 06/29/2018   Procedure: CATARACT EXTRACTION PHACO AND INTRAOCULAR LENS PLACEMENT (IOC);  Surgeon: Eulogio Bear, MD;  Location: [facility] ORS;  Service: Ophthalmology;  Laterality: Left;  [place] 01:27.7 AP% 8.2 CDE  7.27 Fluid pack lot # [id number]   EYE SURGERY     HERNIA REPAIR     PROSTATE SURGERY     PROSTATE SURGERY  2016    Prior to Admission medications   Medication Sig Start Date End Date Taking? Authorizing Provider  acetaminophen (TYLENOL) 325 MG tablet Take 650 mg by mouth every 6 (six) hours as needed for moderate pain or headache.    [provider]  atorvastatin (LIPITOR) 40 MG tablet Take 40 mg by mouth at bedtime. 05/22/18   [provider]  canagliflozin (INVOKANA) 300 MG TABS tablet Take 300 mg by mouth daily before breakfast.    [provider]  Difluprednate 0.05 % EMUL Place 1 drop into the right eye 2 (two) times daily.     [provider]  hydrocortisone 2.5 % cream Apply topically 2 (two) times daily. 04/26/19   Chrystal, Eulas Post, MD  JARDIANCE 10 MG TABS tablet TAKE 1 TABLET BY MOUTH IN THE MORNING FOR DIABETES 02/08/19   [provider]  levETIRAcetam (KEPPRA) 500 MG tablet Take 1 tablet (500 mg total) by mouth 2 (two) times daily. 04/29/20   Rudene Re, MD  losartan-hydrochlorothiazide [facility]) 100-25 MG tablet Take 1 tablet by mouth daily.    [provider]  meclizine (ANTIVERT) 12.5 MG tablet Take 1 tablet (12.5 mg total) by mouth 2 (two) times daily as needed for dizziness. 06/28/18   Merlyn Lot, MD  metFORMIN (GLUCOPHAGE) 500 MG tablet Take 500 mg by mouth 2 (two) times  daily with a meal.    [provider]  oxybutynin (DITROPAN-XL) 10 MG 24 hr tablet Take 1 tablet (10 mg total) by mouth daily. 11/22/19   [name] A, PA-C  sildenafil (REVATIO) 20 MG tablet Take 3 to 5 tablets two hours before intercouse on an empty stomach.  Do not take with nitrates. 12/22/18   [name] A, PA-C  sildenafil (VIAGRA) 100 MG tablet Take 1 tablet (100 mg total) by mouth daily as needed for erectile dysfunction. Take two hours prior to intercourse on an empty stomach 01/31/20   [name] A, PA-C     Allergies Penicillins  Family History  Problem Relation Age of Onset   Cancer Mother     Social History Social History   Tobacco Use   Smoking status: Some Days    Packs/day: 2.00    Types: Cigarettes   Smokeless tobacco: Never  Vaping Use   Vaping Use: Never used  Substance Use Topics   Alcohol use: Yes    Alcohol/week: 2.0 standard drinks    Types: 2 Cans of beer per week    Comment: 2 beers twice a week   Drug use: No    Review of Systems Constitutional: No fever/chills Eyes: No visual changes. ENT: No sore throat. Cardiovascular: Denies chest pain. Respiratory: Denies shortness of breath. Gastrointestinal: Positive for rectal bleeding.  Genitourinary: Negative for dysuria. Musculoskeletal: Negative for back pain. Skin: Negative for rash. Neurological: Negative for headaches, focal weakness or numbness.  ____________________________________________   PHYSICAL EXAM:  VITAL SIGNS: ED Triage Vitals  Enc Vitals Group     BP 10/26/21 1443 (!) 205/90     Pulse Rate 10/26/21 1440 60     Resp 10/26/21 1440 16     Temp 10/26/21 1443 97.6 F (36.4 C)     Temp Source 10/26/21 1440 Oral     SpO2 10/26/21 1440 98 %     Weight 10/26/21 1441 114 lb (51.7 kg)     Height 10/26/21 1441 5\' 6"  (1.676 m)     Head Circumference --      Peak Flow --      Pain Score 10/26/21 1441 8   Constitutional: Alert and oriented.  Eyes: Conjunctivae are normal.  ENT      Head: Normocephalic and atraumatic.      Nose: No congestion/rhinnorhea.      Mouth/Throat: Mucous membranes are moist.      Neck: No stridor. Hematological/Lymphatic/Immunilogical: No cervical lymphadenopathy. Cardiovascular: Normal rate, regular rhythm.  No murmurs, rubs, or gallops.  Respiratory: Normal respiratory effort without tachypnea nor retractions. Breath sounds are clear and equal bilaterally. No wheezes/rales/rhonchi. Rectal: Positive for hemorrhoid, no bright red blood on glove, GUIAC  positive.  Genitourinary: Deferred Musculoskeletal: Normal range of motion in all extremities. No lower extremity edema. Neurologic:  Normal speech and language. No gross focal neurologic deficits are appreciated.  Skin:  Skin is warm, dry and intact. No rash noted. Psychiatric: Depressed.  ____________________________________________    LABS (pertinent positives/negatives)  CMP wnl except t pro 8.2 CBC wbc 8.4, hgb 14.0, plt 186  ____________________________________________   EKG  None  ____________________________________________    RADIOLOGY  None  ____________________________________________   PROCEDURES  Procedures  ____________________________________________   INITIAL IMPRESSION / ASSESSMENT AND PLAN / ED COURSE  Pertinent labs & imaging results that were available during my care of the patient were reviewed by me and considered in my medical decision making (see chart for details).  Patient presented to the emergency department today with primary concern for rectal bleeding.  He states it has been 3 days where he is noticed bright red blood.  On exam patient does have hemorrhoids.  No bright red blood on gloved and no active bleeding appreciated.  It was guaiac positive.  At this time I do think likely patient suffering from hemorrhoidal bleed.  Will however check a CT scan given history of prostate cancer to evaluate for any rectal mass or potentially diverticulosis although I do think this is less likely.  Additionally patient discussed worsening depression recently.  Will plan on having psychiatry evaluate.  ____________________________________________   FINAL CLINICAL IMPRESSION(S) / ED DIAGNOSES  Final diagnoses:  Depression, unspecified depression type  Hemorrhoids, unspecified hemorrhoid type     Note: This dictation was prepared with Dragon dictation. Any transcriptional errors that result from this process are unintentional     Nance Pear, MD 10/26/21 [phone]

## 2021-10-26 NOTE — ED Provider Notes (Signed)
Emergency Medicine Provider Triage Evaluation Note  Alexander Avery, a 65 y.o. male  was evaluated in triage.  Pt complains of rectal bleeding.  Patient presents to the ED via EMS from home, with low abdominal pain x2 days.  He noted bright red blood per rectum today as well as rectal pain.  He denies any history of GI bleeding.  Review of Systems  Positive: GI bleeding, rectal pain  Negative: FCS  Physical Exam  BP (!) 205/90   Pulse 60   Temp 97.6 F (36.4 C)   Resp 16   Ht 5\' 6"  (1.676 m)   Wt 51.7 kg   SpO2 98%   BMI 18.40 kg/m  Gen:   Awake, no distress  NAD Resp:  Normal effort CTA MSK:   Moves extremities without difficulty  Other:  ABD: soft, nontender  Medical Decision Making  Medically screening exam initiated at 2:52 PM.  Appropriate orders placed.  Yashar D Greenstein was informed that the remainder of the evaluation will be completed by another provider, this initial triage assessment does not replace that evaluation, and the importance of remaining in the ED until their evaluation is complete.  ED evaluation of GI bleeding and rectal pain with onset this morning.  He notes associated nausea and vomiting.   Melvenia Needles, PA-C 10/26/21 1453    Nena Polio, MD 10/26/21 1754

## 2021-10-27 ENCOUNTER — Encounter: Payer: Self-pay | Admitting: Radiology

## 2021-10-27 ENCOUNTER — Emergency Department: Payer: Medicare HMO

## 2021-10-27 DIAGNOSIS — F1910 Other psychoactive substance abuse, uncomplicated: Secondary | ICD-10-CM

## 2021-10-27 DIAGNOSIS — F331 Major depressive disorder, recurrent, moderate: Secondary | ICD-10-CM | POA: Diagnosis present

## 2021-10-27 LAB — HEMOGLOBIN AND HEMATOCRIT, BLOOD
HCT: 40.7 % (ref 39.0–52.0)
Hemoglobin: 14.9 g/dL (ref 13.0–17.0)

## 2021-10-27 LAB — URINALYSIS, ROUTINE W REFLEX MICROSCOPIC
Bacteria, UA: NONE SEEN
Bilirubin Urine: NEGATIVE
Glucose, UA: NEGATIVE mg/dL
Ketones, ur: NEGATIVE mg/dL
Leukocytes,Ua: NEGATIVE
Nitrite: NEGATIVE
Protein, ur: 100 mg/dL — AB
Specific Gravity, Urine: 1.012 (ref 1.005–1.030)
Squamous Epithelial / HPF: NONE SEEN (ref 0–5)
pH: 8 (ref 5.0–8.0)

## 2021-10-27 LAB — URINE DRUG SCREEN, QUALITATIVE (ARMC ONLY)
Amphetamines, Ur Screen: NOT DETECTED
Barbiturates, Ur Screen: NOT DETECTED
Benzodiazepine, Ur Scrn: NOT DETECTED
Cannabinoid 50 Ng, Ur ~~LOC~~: POSITIVE — AB
Cocaine Metabolite,Ur ~~LOC~~: NOT DETECTED
MDMA (Ecstasy)Ur Screen: NOT DETECTED
Methadone Scn, Ur: NOT DETECTED
Opiate, Ur Screen: NOT DETECTED
Phencyclidine (PCP) Ur S: NOT DETECTED
Tricyclic, Ur Screen: NOT DETECTED

## 2021-10-27 LAB — RESP PANEL BY RT-PCR (FLU A&B, COVID) ARPGX2
Influenza A by PCR: NEGATIVE
Influenza B by PCR: NEGATIVE
SARS Coronavirus 2 by RT PCR: NEGATIVE

## 2021-10-27 LAB — LIPASE, BLOOD: Lipase: 63 U/L — ABNORMAL HIGH (ref 11–51)

## 2021-10-27 MED ORDER — IOHEXOL 300 MG/ML  SOLN
100.0000 mL | Freq: Once | INTRAMUSCULAR | Status: AC | PRN
  Administered 2021-10-27: 100 mL via INTRAVENOUS

## 2021-10-27 MED ORDER — ACETAMINOPHEN 500 MG PO TABS: 1000.0000 mg | ORAL_TABLET | Freq: Once | ORAL | Status: AC

## 2021-10-27 MED ORDER — FAMOTIDINE 20 MG PO TABS
40.0000 mg | ORAL_TABLET | Freq: Once | ORAL | Status: AC
  Administered 2021-10-27: 40 mg via ORAL
  Filled 2021-10-27: qty 2

## 2021-10-27 MED ORDER — ACETAMINOPHEN 500 MG PO TABS
ORAL_TABLET | ORAL | Status: AC
  Administered 2021-10-27: 1000 mg via ORAL
  Filled 2021-10-27: qty 2

## 2021-10-27 NOTE — Consult Note (Signed)
[facility] Face-to-Face Psychiatry Consult   Reason for Consult: GI Bleeding Referring Physician: Dr. Archie Balboa Patient Identification: Alexander Avery MRN:  [id number] Principal Diagnosis: <principal problem not specified> Diagnosis:  Active Problems:   Prostate cancer ([place])   Polysubstance abuse ([place])   MDD (major depressive disorder), recurrent episode, moderate ([place])   Total Time spent with patient: 1 hour  Subjective: "I use some heroin or at least that's what I think." Alexander Avery is a 65 y.o. male patient [facility] ED via POV voluntary. The patient said he came in tonight due to him having problems with his prostrate and wanting help for his substance use. The patient stated he last used heroin on 12.11.22, or it could have been fentanyl. The patient currently has provided urine for his UDS. The patient does have a history of positive UDS. The patient also has prostate cancer and voiced depression due to his illness. The patient was seen face-to-face by this provider; the chart was reviewed and consulted with Dr. Starleen Blue on 10/27/2021 due to the patient's care. It was discussed with the EDP that the patient is requesting treatment for his substance use disorder.  On evaluation, the patient is alert and oriented x 4, easily agitated, depressed but somewhat cooperative, and mood-congruent with affect. The patient does not appear to be responding to internal or external stimuli. Neither is the patient presenting with any delusional thinking. The patient denies auditory or visual hallucinations. The patient admits to suicidal ideation without a plan but denies homicidal ideations. The patient is not presenting with any psychotic or paranoid behaviors. During an encounter with the patient, he could answer most questions appropriately.  HPI: Per Dr. Archie Balboa, Alexander Avery is a 65 y.o. male who presents to the emergency department today with primary concern for GI bleeding. Patient states that three  days ago first noticed some red blood after a bowel movement. Since then he says he has only been passing red blood. Has had some discomfort with it. The patient also is complaining of chronic pain to the incision site of his prostatectomy. Furthermore the patient states he has been feeling depressed and has been having suicidal thoughts.   Past Psychiatric History:  Polysubstance abuse  Depression.  Risk to Self:   Risk to Others:   Prior Inpatient Therapy:   Prior Outpatient Therapy:    Past Medical History:  Past Medical History:  Diagnosis Date   Anxiety    Arthritis    Cancer ([place])    prostate   Cataract 2019   Diabetes mellitus without complication ([place])    GERD (gastroesophageal reflux disease)    Hernia of abdominal wall 2015   History of stomach ulcers    Hypertension    Prostate cancer ([place])    Seizures ([place])    Sleep apnea     Past Surgical History:  Procedure Laterality Date   CATARACT EXTRACTION W/PHACO Right 03/30/2018   Procedure: CATARACT EXTRACTION PHACO AND INTRAOCULAR LENS PLACEMENT ([place]);  Surgeon: Eulogio Bear, MD;  Location: [facility] ORS;  Service: Ophthalmology;  Laterality: Right;  [place] 01:09.5 AP% 10.4 CDE 7.98 Fluid pack lot # [id number]   CATARACT EXTRACTION W/PHACO Left 06/29/2018   Procedure: CATARACT EXTRACTION PHACO AND INTRAOCULAR LENS PLACEMENT (IOC);  Surgeon: Eulogio Bear, MD;  Location: [facility] ORS;  Service: Ophthalmology;  Laterality: Left;  [place] 01:27.7 AP% 8.2 CDE 7.27 Fluid pack lot # [id number]   EYE SURGERY     HERNIA REPAIR  PROSTATE SURGERY     PROSTATE SURGERY  2016   Family History:  Family History  Problem Relation Age of Onset   Cancer Mother    Family Psychiatric  History:  Social History:  Social History   Substance and Sexual Activity  Alcohol Use Yes   Alcohol/week: 2.0 standard drinks   Types: 2 Cans of beer per week   Comment: 2 beers twice a week     Social History   Substance and Sexual Activity   Drug Use No    Social History   Socioeconomic History   Marital status: Single    Spouse name: Not on file   Number of children: Not on file   Years of education: Not on file   Highest education level: Not on file  Occupational History   Not on file  Tobacco Use   Smoking status: Some Days    Packs/day: 2.00    Types: Cigarettes   Smokeless tobacco: Never  Vaping Use   Vaping Use: Never used  Substance and Sexual Activity   Alcohol use: Yes    Alcohol/week: 2.0 standard drinks    Types: 2 Cans of beer per week    Comment: 2 beers twice a week   Drug use: No   Sexual activity: Not on file  Other Topics Concern   Not on file  Social History Narrative   Not on file   Social Determinants of Health   Financial Resource Strain: Not on file  Food Insecurity: Not on file  Transportation Needs: Not on file  Physical Activity: Not on file  Stress: Not on file  Social Connections: Not on file   Additional Social History:    Allergies:   Allergies  Allergen Reactions   Penicillins Hives and Other (See Comments)    Urinate on self, pass out Has patient had a PCN reaction causing immediate rash, facial/tongue/throat swelling, SOB or lightheadedness with hypotension: Yes Has patient had a PCN reaction causing severe rash involving mucus membranes or skin necrosis: No Has patient had a PCN reaction that required hospitalization: Yes- In hospital Has patient had a PCN reaction occurring within the last 10 years: Yes If all of the above answers are "NO", then may proceed with Cephalosporin use.     Labs:  Results for orders placed or performed during the hospital encounter of 10/26/21 (from the past 48 hour(s))  Comprehensive metabolic panel     Status: Abnormal   Collection Time: 10/26/21  2:44 PM  Result Value Ref Range   Sodium 135 135 - 145 mmol/L   Potassium 3.5 3.5 - 5.1 mmol/L   Chloride 100 98 - 111 mmol/L   CO2 25 22 - 32 mmol/L   Glucose, Bld 88 70 - 99  mg/dL    Comment: Glucose reference range applies only to samples taken after fasting for at least 8 hours.   BUN 17 8 - 23 mg/dL   Creatinine, Ser 1.03 0.61 - 1.24 mg/dL   Calcium 9.4 8.9 - 10.3 mg/dL   Total Protein 8.2 (H) 6.5 - 8.1 g/dL   Albumin 3.6 3.5 - 5.0 g/dL   AST 30 15 - 41 U/L   ALT 23 0 - 44 U/L   Alkaline Phosphatase 71 38 - 126 U/L   Total Bilirubin 1.0 0.3 - 1.2 mg/dL   GFR, Estimated >60 >60 mL/min    Comment: (NOTE) Calculated using the CKD-EPI Creatinine Equation (2021)    Anion gap 10 5 -  15    Comment: Performed at [facility], [address] [place], [place] [address]  CBC     Status: Abnormal   Collection Time: 10/26/21  2:44 PM  Result Value Ref Range   WBC 8.4 4.0 - 10.5 K/uL   RBC 4.76 4.22 - 5.81 MIL/uL   Hemoglobin 14.0 13.0 - 17.0 g/dL   HCT 38.3 (L) 39.0 - 52.0 %   MCV 80.5 80.0 - 100.0 fL   MCH 29.4 26.0 - 34.0 pg   MCHC 36.6 (H) 30.0 - 36.0 g/dL   RDW 13.7 11.5 - 15.5 %   Platelets 186 150 - 400 K/uL   nRBC 0.0 0.0 - 0.2 %    Comment: Performed at [facility], [address], [place], [place] [address]  Type and screen [place]     Status: None   Collection Time: 10/26/21  2:44 PM  Result Value Ref Range   ABO/RH(D) B POS    Antibody Screen NEG    Sample Expiration      10/29/2021,2359 Performed at [facility], [address] [place], [place] [address]   Hemoglobin and hematocrit, blood     Status: None   Collection Time: 10/27/21  3:24 AM  Result Value Ref Range   Hemoglobin 14.9 13.0 - 17.0 g/dL   HCT 40.7 39.0 - 52.0 %    Comment: Performed at [facility], [address] [place], [place] [address]  Lipase, blood     Status: Abnormal   Collection Time: 10/27/21  3:24 AM  Result Value Ref Range   Lipase 63 (H) 11 - 51 U/L    Comment: Performed at [facility], [address] [place], [place] [address]    No current facility-administered medications  for this encounter.   Current Outpatient Medications  Medication Sig Dispense Refill   hydrocortisone (ANUSOL-HC) 25 MG suppository Place 1 suppository (25 mg total) rectally every 12 (twelve) hours. 12 suppository 1   acetaminophen (TYLENOL) 325 MG tablet Take 650 mg by mouth every 6 (six) hours as needed for moderate pain or headache.     atorvastatin (LIPITOR) 40 MG tablet Take 40 mg by mouth at bedtime.  4   canagliflozin (INVOKANA) 300 MG TABS tablet Take 300 mg by mouth daily before breakfast.     Difluprednate 0.05 % EMUL Place 1 drop into the right eye 2 (two) times daily.      hydrocortisone 2.5 % cream Apply topically 2 (two) times daily. 30 g 1   JARDIANCE 10 MG TABS tablet TAKE 1 TABLET BY MOUTH IN THE MORNING FOR DIABETES     levETIRAcetam (KEPPRA) 500 MG tablet Take 1 tablet (500 mg total) by mouth 2 (two) times daily. 60 tablet 1   losartan-hydrochlorothiazide (HYZAAR) 100-25 MG tablet Take 1 tablet by mouth daily.     meclizine (ANTIVERT) 12.5 MG tablet Take 1 tablet (12.5 mg total) by mouth 2 (two) times daily as needed for dizziness. 10 tablet 0   metFORMIN (GLUCOPHAGE) 500 MG tablet Take 500 mg by mouth 2 (two) times daily with a meal.     oxybutynin (DITROPAN-XL) 10 MG 24 hr tablet Take 1 tablet (10 mg total) by mouth daily. 90 tablet 0   sildenafil (REVATIO) 20 MG tablet Take 3 to 5 tablets two hours before intercouse on an empty stomach.  Do not take with nitrates. 50 tablet 3   sildenafil (VIAGRA) 100 MG tablet Take 1 tablet (  100 mg total) by mouth daily as needed for erectile dysfunction. Take two hours prior to intercourse on an empty stomach 30 tablet 3    Musculoskeletal: Strength & Muscle Tone: within normal limits Gait & Station: normal Patient leans: N/A  Psychiatric Specialty Exam:  Presentation  General Appearance: Bizarre  Eye Contact:Minimal  Speech:Garbled  Speech Volume:Decreased  Handedness:Right   Mood and Affect   Mood:Irritable  Affect:Inappropriate; Congruent   Thought Process  Thought Processes:Coherent  Descriptions of Associations:Intact  Orientation:Full (Time, Place and Person)  Thought Content:Logical  History of Schizophrenia/Schizoaffective disorder:No  Duration of Psychotic Symptoms:No data recorded Hallucinations:Hallucinations: None  Ideas of Reference:None  Suicidal Thoughts:Suicidal Thoughts: Yes, Passive SI Passive Intent and/or Plan: Without Intent; Without Plan  Homicidal Thoughts:Homicidal Thoughts: No   Sensorium  Memory:Immediate Good; Recent Good; Remote Good  Judgment:Fair  Insight:Fair   Executive Functions  Concentration:Fair  Attention Span:Fair  [place]   Psychomotor Activity  Psychomotor Activity:Psychomotor Activity: Normal   Assets  Assets:Communication Skills; Desire for Improvement; Physical Health; Resilience; Social Support   Sleep  Sleep:Sleep: Fair   Physical Exam: Physical Exam Vitals and nursing note reviewed.  Constitutional:      Appearance: Normal appearance. He is normal weight.  HENT:     Head: Normocephalic and atraumatic.     Right Ear: External ear normal.     Left Ear: External ear normal.     Nose: Nose normal.     Mouth/Throat:     Mouth: Mucous membranes are dry.  Cardiovascular:     Rate and Rhythm: Bradycardia present.  Pulmonary:     Effort: Pulmonary effort is normal.  Musculoskeletal:        General: Normal range of motion.     Cervical back: Normal range of motion and neck supple.  Neurological:     General: No focal deficit present.     Mental Status: He is alert and oriented to person, place, and time. Mental status is at baseline.  Psychiatric:        Attention and Perception: Attention and perception normal.        Mood and Affect: Mood is anxious and depressed. Affect is angry.        Speech: Speech normal.        Behavior: Behavior is  uncooperative and agitated.        Thought Content: Thought content includes suicidal ideation.        Cognition and Memory: Cognition and memory normal.        Judgment: Judgment is impulsive and inappropriate.   Review of Systems  Psychiatric/Behavioral:  Positive for depression and substance abuse. The patient is nervous/anxious.   All other systems reviewed and are negative. Blood pressure (!) 193/70, pulse (!) 53, temperature 97.6 F (36.4 C), resp. rate 17, height 5\' 6"  (1.676 m), weight 51.7 kg, SpO2 99 %. Body mass index is 18.4 kg/m.  Treatment Plan Summary: Plan the patient is requesting treatment for his substance use disorder.   Disposition: Supportive therapy provided about ongoing stressors. The patient is requesting treatment for his substance use disorder.   Caroline Sauger, NP 10/27/2021 4:13 AM

## 2021-10-27 NOTE — BH Assessment (Addendum)
Referral information for Detox treatment faxed to;   [facility] [phone])  [facility] [phone])  [facility] [phone] -or- [phone]),   [facility] [phone])  [facility] [phone])

## 2021-10-27 NOTE — BH Assessment (Signed)
Writer received call from [facility] stating patient was denied due to insurance.

## 2021-10-27 NOTE — ED Notes (Signed)
Patient transported to CT 

## 2021-10-27 NOTE — BH Assessment (Signed)
Patient has been accepted to [facility].  Patient assigned to [facility] B Unit. Accepting physician is Dr. Alcide Clever.  Call report to [phone].  Representative was [employer].   ER Staff is aware of it:  [place], Secretary  Dr. Joni Fears, ER MD  Deneise Lever, Patient's Nurse  Patient is voluntary and will need signed consent form.

## 2021-10-27 NOTE — ED Notes (Signed)
Pt requesting medication for heroin detox. RN informed pt that MD was in critical pt's room. Pt stating, "I've been waiting all night for it." MD messaged in regard to pt's concern.

## 2021-10-27 NOTE — BH Assessment (Signed)
Comprehensive Clinical Assessment (CCA) Note  10/27/2021 Alexander Avery [id number]  Chief Complaint: Patient is a 65 year old male presenting to [facility] ED voluntarily requesting detox treatment for his substance use and depression. During assessment patient appears alert and oriented x4, calm and cooperative. Patient reports "I was feeling like hurting myself" and reports using substances "I last used yesterday." Patient reports his drug of choice "heroin." He reports that he uses heroin daily "$60 worth." Patient reports that he was clean for 2 years in the past and relapsed in 2016 due to a Cancer diagnosis. Patient reports since then he has had poor sleep and poor appetite. Patient reports SI but has no plan, he denies HI/AH/VH Chief Complaint  Patient presents with   Addiction Problem   Visit Diagnosis: Polysubstance Use, Opioid Use    CCA Screening, Triage and Referral (STR)  Patient Reported Information How did you hear about [place]? Self  Referral name: No data recorded Referral phone number: No data recorded  Whom do you see for routine medical problems? No data recorded Practice/Facility Name: No data recorded Practice/Facility Phone Number: No data recorded Name of Contact: No data recorded Contact Number: No data recorded Contact Fax Number: No data recorded Prescriber Name: No data recorded Prescriber Address (if known): No data recorded  What Is the Reason for Your Visit/Call Today? Patient presents voluntarily seeking detox treatment for his substance abuse and depression  How Long Has This Been Causing You Problems? > than 6 months  What Do You Feel Would Help You the Most Today? Alcohol or Drug Use Treatment   Have You Recently Been in Any Inpatient Treatment (Hospital/Detox/Crisis Center/28-Day Program)? No data recorded Name/Location of Program/Hospital:No data recorded How Long Were You There? No data recorded When Were You Discharged? No data recorded  Have  You Ever Received Services From [facility] Before? No data recorded Who Do You See at [facility]? No data recorded  Have You Recently Had Any Thoughts About Hurting Yourself? Yes  Are You Planning to Commit Suicide/Harm Yourself At This time? No   Have you Recently Had Thoughts About [place]? No  Explanation: No data recorded  Have You Used Any Alcohol or Drugs in the Past 24 Hours? Yes  How Long Ago Did You Use Drugs or Alcohol? No data recorded What Did You Use and How Much? Heroin   Do You Currently Have a Therapist/Psychiatrist? No  Name of Therapist/Psychiatrist: No data recorded  Have You Been Recently Discharged From Any Office Practice or Programs? No  Explanation of Discharge From Practice/Program: No data recorded    CCA Screening Triage Referral Assessment Type of Contact: Face-to-Face  Is this Initial or Reassessment? No data recorded Date Telepsych consult ordered in [facility]:  No data recorded Time Telepsych consult ordered in [facility]:  No data recorded  Patient Reported Information Reviewed? No data recorded Patient Left Without Being Seen? No data recorded Reason for Not Completing Assessment: No data recorded  Collateral Involvement: No data recorded  Does Patient Have a Marblemount? No data recorded Name and Contact of Legal Guardian: No data recorded If Minor and Not Living with Parent(s), Who has Custody? No data recorded Is CPS involved or ever been involved? Never  Is APS involved or ever been involved? Never   Patient Determined To Be At Risk for Harm To Self or Others Based on Review of Patient Reported Information or Presenting Complaint? No  Method: No data recorded Availability of  Means: No data recorded Intent: No data recorded Notification Required: No data recorded Additional Information for Danger to Others Potential: No data recorded Additional Comments for Danger to Others Potential: No data recorded Are  There Guns or Other Weapons in Your Home? No data recorded Types of Guns/Weapons: No data recorded Are These Weapons Safely Secured?                            No data recorded Who Could Verify You Are Able To Have These Secured: No data recorded Do You Have any Outstanding Charges, Pending Court Dates, Parole/Probation? No data recorded Contacted To Inform of Risk of Harm To Self or Others: No data recorded  Location of Assessment: [facility] ED   Does Patient Present under Involuntary Commitment? No  IVC Papers Initial File Date: No data recorded  [place] of Residence: [place]   Patient Currently Receiving the Following Services: No data recorded  Determination of Need: Emergent (2 hours)   Options For Referral: No data recorded    CCA Biopsychosocial Intake/Chief Complaint:  No data recorded Current Symptoms/Problems: No data recorded  Patient Reported Schizophrenia/Schizoaffective Diagnosis in Past: No   Strengths: Patient is able to communicate his needs  Preferences: No data recorded Abilities: No data recorded  Type of Services Patient Feels are Needed: No data recorded  Initial Clinical Notes/Concerns: No data recorded  Mental Health Symptoms Depression:   Change in energy/activity; Fatigue; Hopelessness; Increase/decrease in appetite; Sleep (too much or little); Worthlessness   Duration of Depressive symptoms:  Greater than two weeks   Mania:   None   Anxiety:    Difficulty concentrating; Irritability; Restlessness   Psychosis:   None   Duration of Psychotic symptoms: No data recorded  Trauma:   None   Obsessions:   None   Compulsions:   None   Inattention:   None   Hyperactivity/Impulsivity:   None   Oppositional/Defiant Behaviors:   None   Emotional Irregularity:   None   Other Mood/Personality Symptoms:  No data recorded   Mental Status Exam Appearance and self-care  Stature:   Average   Weight:   Average weight    Clothing:   Casual   Grooming:   Normal   Cosmetic use:   None   Posture/gait:   Normal   Motor activity:   Not Remarkable   Sensorium  Attention:   Normal   Concentration:   Normal   Orientation:   X5   Recall/memory:   Normal   Affect and Mood  Affect:   Appropriate   Mood:   Depressed   Relating  Eye contact:   Avoided   Facial expression:   Responsive   Attitude toward examiner:   Cooperative   Thought and Language  Speech flow:  Clear and Coherent   Thought content:   Appropriate to Mood and Circumstances   Preoccupation:   None   Hallucinations:   None   Organization:  No data recorded  [employer] of Knowledge:   Fair   Intelligence:   Average   Abstraction:   Normal   Judgement:   Fair   Art therapist:   Realistic   Insight:   Fair   Decision Making:   Normal   Social Functioning  Social Maturity:   Isolates   Social Judgement:   Normal   Stress  Stressors:   Financial   Coping Ability:   Exhausted  Skill Deficits:   None   Supports:   Support needed     Religion: Religion/Spirituality Are You A Religious Person?: No  Leisure/Recreation: Leisure / Recreation Do You Have Hobbies?: No  Exercise/Diet: Exercise/Diet Do You Exercise?: No Have You Gained or Lost A Significant Amount of Weight in the Past Six Months?: No Do You Follow a Special Diet?: No Do You Have Any Trouble Sleeping?: No   CCA Employment/Education Employment/Work Situation: Employment / Work Technical sales engineer: On disability Why is Patient on Disability: Cancer How Long has Patient Been on Disability: Unknown Patient's Job has Been Impacted by Current Illness: No Has Patient ever Been in the [employer]?: No  Education: Education Is Patient Currently Attending School?: No Did You Have An Individualized Education Program (IIEP): No Did You Have Any Difficulty At School?: No Patient's  Education Has Been Impacted by Current Illness: No   CCA Family/Childhood History Family and Relationship History: Family history Marital status: Single Does patient have children?: No  Childhood History:  Childhood History Did patient suffer any verbal/emotional/physical/sexual abuse as a child?: No Did patient suffer from severe childhood neglect?: No Has patient ever been sexually abused/assaulted/raped as an adolescent or adult?: No Was the patient ever a victim of a crime or a disaster?: No Witnessed domestic violence?: No Has patient been affected by domestic violence as an adult?: No  Child/Adolescent Assessment:     CCA Substance Use Alcohol/Drug Use: Alcohol / Drug Use Pain Medications: See MAR Prescriptions: See MAR Over the Counter: See MAR History of alcohol / drug use?: Yes Substance #1 Name of Substance 1: Heroin 1 - Amount (size/oz): $60 worth 1 - Frequency: daily 1 - Last Use / Amount: 10/26/21 1- Route of Use: Inhaling                       ASAM's:  Six Dimensions of Multidimensional Assessment  Dimension 1:  Acute Intoxication and/or Withdrawal Potential:      Dimension 2:  Biomedical Conditions and Complications:      Dimension 3:  Emotional, Behavioral, or Cognitive Conditions and Complications:     Dimension 4:  Readiness to Change:     Dimension 5:  Relapse, Continued use, or Continued Problem Potential:     Dimension 6:  Recovery/Living Environment:     ASAM Severity Score:    ASAM Recommended Level of Treatment: ASAM Recommended Level of Treatment: Level III Residential Treatment   Substance use Disorder (SUD) Substance Use Disorder (SUD)  Checklist Symptoms of Substance Use: Continued use despite having a persistent/recurrent physical/psychological problem caused/exacerbated by use, Evidence of tolerance, Evidence of withdrawal (Comment), Presence of craving or strong urge to use, Social, occupational, recreational activities  given up or reduced due to use, Continued use despite persistent or recurrent social, interpersonal problems, caused or exacerbated by use, Large amounts of time spent to obtain, use or recover from the substance(s), Persistent desire or unsuccessful efforts to cut down or control use, Recurrent use that results in a failure to fulfill major role obligations (work, school, home)  Recommendations for Services/Supports/Treatments: Recommendations for Services/Supports/Treatments Recommendations For Services/Supports/Treatments: Detox  DSM5 Diagnoses: Patient Active Problem List   Diagnosis Date Noted   Polysubstance abuse ([place]) 04/18/2020   Prostate cancer ([place]) 02/20/2019    Patient Centered Plan: Patient is on the following Treatment Plan(s):  Depression and Substance Abuse   Referrals to Alternative Service(s): Referred to Alternative Service(s):   Place:   Date:   Time:  Referred to Alternative Service(s):   Place:   Date:   Time:    Referred to Alternative Service(s):   Place:   Date:   Time:    Referred to Alternative Service(s):   Place:   Date:   Time:     Alexander Avery, LCAS-A

## 2021-10-27 NOTE — ED Notes (Signed)
Pt c/o nausea, mild shaking and diaphoresis. Pt acting anxious and mildly restless. Pt denies pain. Pt requesting medication for symptoms. No PRN meds listed- contacted EDP.

## 2021-10-27 NOTE — ED Notes (Signed)
RN attempted to call report and RN receiving pt was busy at that time. This RN gave Network engineer name and call back number

## 2021-11-26 ENCOUNTER — Encounter: Payer: Self-pay | Admitting: Psychiatry

## 2021-11-26 ENCOUNTER — Other Ambulatory Visit: Payer: Self-pay

## 2021-11-26 ENCOUNTER — Emergency Department (EMERGENCY_DEPARTMENT_HOSPITAL)
Admission: EM | Admit: 2021-11-26 | Discharge: 2021-11-26 | Disposition: A | Discharge status: Other Acute Inpatient Hospital | Payer: 59 | Source: Home / Self Care | Attending: Emergency Medicine | Admitting: Emergency Medicine

## 2021-11-26 ENCOUNTER — Inpatient Hospital Stay
Admission: AD | Admit: 2021-11-26 | Discharge: 2021-11-30 | DRG: 885 | Disposition: A | Discharge status: Home / Self Care | Payer: 59 | Source: Intra-hospital | Attending: Psychiatry | Admitting: Psychiatry

## 2021-11-26 DIAGNOSIS — R45851 Suicidal ideations: Secondary | ICD-10-CM | POA: Diagnosis present

## 2021-11-26 DIAGNOSIS — I10 Essential (primary) hypertension: Secondary | ICD-10-CM | POA: Insufficient documentation

## 2021-11-26 DIAGNOSIS — Z8546 Personal history of malignant neoplasm of prostate: Secondary | ICD-10-CM | POA: Insufficient documentation

## 2021-11-26 DIAGNOSIS — F1123 Opioid dependence with withdrawal: Secondary | ICD-10-CM | POA: Diagnosis present

## 2021-11-26 DIAGNOSIS — F332 Major depressive disorder, recurrent severe without psychotic features: Secondary | ICD-10-CM | POA: Diagnosis not present

## 2021-11-26 DIAGNOSIS — F1193 Opioid use, unspecified with withdrawal: Secondary | ICD-10-CM

## 2021-11-26 DIAGNOSIS — Z046 Encounter for general psychiatric examination, requested by authority: Secondary | ICD-10-CM | POA: Insufficient documentation

## 2021-11-26 DIAGNOSIS — F1920 Other psychoactive substance dependence, uncomplicated: Secondary | ICD-10-CM | POA: Diagnosis present

## 2021-11-26 DIAGNOSIS — T50902A Poisoning by unspecified drugs, medicaments and biological substances, intentional self-harm, initial encounter: Secondary | ICD-10-CM | POA: Diagnosis present

## 2021-11-26 DIAGNOSIS — F17210 Nicotine dependence, cigarettes, uncomplicated: Secondary | ICD-10-CM | POA: Diagnosis present

## 2021-11-26 DIAGNOSIS — Z20822 Contact with and (suspected) exposure to covid-19: Secondary | ICD-10-CM | POA: Insufficient documentation

## 2021-11-26 DIAGNOSIS — F1910 Other psychoactive substance abuse, uncomplicated: Secondary | ICD-10-CM | POA: Insufficient documentation

## 2021-11-26 DIAGNOSIS — Z880 Allergy status to penicillin: Secondary | ICD-10-CM | POA: Diagnosis not present

## 2021-11-26 DIAGNOSIS — E119 Type 2 diabetes mellitus without complications: Secondary | ICD-10-CM | POA: Diagnosis present

## 2021-11-26 DIAGNOSIS — C61 Malignant neoplasm of prostate: Secondary | ICD-10-CM | POA: Diagnosis present

## 2021-11-26 DIAGNOSIS — Z7984 Long term (current) use of oral hypoglycemic drugs: Secondary | ICD-10-CM | POA: Diagnosis not present

## 2021-11-26 LAB — COMPREHENSIVE METABOLIC PANEL
ALT: 14 U/L (ref 0–44)
AST: 22 U/L (ref 15–41)
Albumin: 3.5 g/dL (ref 3.5–5.0)
Alkaline Phosphatase: 60 U/L (ref 38–126)
Anion gap: 8 (ref 5–15)
BUN: 17 mg/dL (ref 8–23)
CO2: 30 mmol/L (ref 22–32)
Calcium: 9.1 mg/dL (ref 8.9–10.3)
Chloride: 100 mmol/L (ref 98–111)
Creatinine, Ser: 1.36 mg/dL — ABNORMAL HIGH (ref 0.61–1.24)
GFR, Estimated: 57 mL/min — ABNORMAL LOW (ref >60)
Glucose, Bld: 98 mg/dL (ref 70–99)
Potassium: 3.2 mmol/L — ABNORMAL LOW (ref 3.5–5.1)
Sodium: 138 mmol/L (ref 135–145)
Total Bilirubin: 0.6 mg/dL (ref 0.3–1.2)
Total Protein: 7.5 g/dL (ref 6.5–8.1)

## 2021-11-26 LAB — CBC
HCT: 31.8 % — ABNORMAL LOW (ref 39.0–52.0)
Hemoglobin: 11.3 g/dL — ABNORMAL LOW (ref 13.0–17.0)
MCH: 30.3 pg (ref 26.0–34.0)
MCHC: 35.5 g/dL (ref 30.0–36.0)
MCV: 85.3 fL (ref 80.0–100.0)
Platelets: 193 10*3/uL (ref 150–400)
RBC: 3.73 MIL/uL — ABNORMAL LOW (ref 4.22–5.81)
RDW: 14.7 % (ref 11.5–15.5)
WBC: 5.9 10*3/uL (ref 4.0–10.5)
nRBC: 0 % (ref 0.0–0.2)

## 2021-11-26 LAB — URINE DRUG SCREEN, QUALITATIVE (ARMC ONLY)
Amphetamines, Ur Screen: NOT DETECTED
Barbiturates, Ur Screen: NOT DETECTED
Benzodiazepine, Ur Scrn: NOT DETECTED
Cannabinoid 50 Ng, Ur ~~LOC~~: POSITIVE — AB
Cocaine Metabolite,Ur ~~LOC~~: POSITIVE — AB
MDMA (Ecstasy)Ur Screen: NOT DETECTED
Methadone Scn, Ur: NOT DETECTED
Opiate, Ur Screen: POSITIVE — AB
Phencyclidine (PCP) Ur S: NOT DETECTED
Tricyclic, Ur Screen: POSITIVE — AB

## 2021-11-26 LAB — RESP PANEL BY RT-PCR (FLU A&B, COVID) ARPGX2
Influenza A by PCR: NEGATIVE
Influenza B by PCR: NEGATIVE
SARS Coronavirus 2 by RT PCR: NEGATIVE

## 2021-11-26 LAB — ACETAMINOPHEN LEVEL: Acetaminophen (Tylenol), Serum: <10 ug/mL — ABNORMAL LOW (ref 10–30)

## 2021-11-26 LAB — SALICYLATE LEVEL: Salicylate Lvl: <7 mg/dL — ABNORMAL LOW (ref 7.0–30.0)

## 2021-11-26 LAB — ETHANOL: Alcohol, Ethyl (B): <10 mg/dL (ref <10)

## 2021-11-26 MED ORDER — CLONIDINE HCL 0.1 MG PO TABS
0.1000 mg | ORAL_TABLET | ORAL | Status: AC
  Administered 2021-11-28 – 2021-11-30 (×4): 0.1 mg via ORAL
  Filled 2021-11-26 (×4): qty 1

## 2021-11-26 MED ORDER — ALUM & MAG HYDROXIDE-SIMETH 200-200-20 MG/5ML PO SUSP: 30.0000 mL | ORAL | Status: DC | PRN

## 2021-11-26 MED ORDER — CLONIDINE HCL 0.1 MG PO TABS: 0.1000 mg | ORAL_TABLET | ORAL | Status: DC

## 2021-11-26 MED ORDER — METFORMIN HCL 500 MG PO TABS: 500.0000 mg | ORAL_TABLET | Freq: Two times a day (BID) | ORAL | Status: DC

## 2021-11-26 MED ORDER — NAPROXEN 500 MG PO TABS: 500.0000 mg | ORAL_TABLET | Freq: Two times a day (BID) | ORAL | Status: DC | PRN

## 2021-11-26 MED ORDER — LOPERAMIDE HCL 2 MG PO CAPS
2.0000 mg | ORAL_CAPSULE | ORAL | Status: DC | PRN
  Filled 2021-11-26: qty 2

## 2021-11-26 MED ORDER — DICYCLOMINE HCL 20 MG PO TABS
20.0000 mg | ORAL_TABLET | Freq: Four times a day (QID) | ORAL | Status: DC | PRN
  Filled 2021-11-26: qty 1

## 2021-11-26 MED ORDER — CLONIDINE HCL 0.1 MG PO TABS
0.1000 mg | ORAL_TABLET | Freq: Four times a day (QID) | ORAL | Status: AC
  Administered 2021-11-26 – 2021-11-28 (×7): 0.1 mg via ORAL
  Filled 2021-11-26 (×7): qty 1

## 2021-11-26 MED ORDER — METHOCARBAMOL 500 MG PO TABS: 500.0000 mg | ORAL_TABLET | Freq: Three times a day (TID) | ORAL | Status: DC | PRN

## 2021-11-26 MED ORDER — HYDROCHLOROTHIAZIDE 25 MG PO TABS
25.0000 mg | ORAL_TABLET | Freq: Every day | ORAL | Status: DC
  Administered 2021-11-26: 25 mg via ORAL
  Filled 2021-11-26: qty 1

## 2021-11-26 MED ORDER — HYDROXYZINE HCL 25 MG PO TABS
25.0000 mg | ORAL_TABLET | Freq: Four times a day (QID) | ORAL | Status: DC | PRN
  Administered 2021-11-26 – 2021-11-29 (×3): 25 mg via ORAL
  Filled 2021-11-26 (×3): qty 1

## 2021-11-26 MED ORDER — EMPAGLIFLOZIN 10 MG PO TABS
10.0000 mg | ORAL_TABLET | Freq: Every day | ORAL | Status: DC
  Administered 2021-11-26: 10 mg via ORAL
  Filled 2021-11-26: qty 1

## 2021-11-26 MED ORDER — METHOCARBAMOL 500 MG PO TABS
500.0000 mg | ORAL_TABLET | Freq: Three times a day (TID) | ORAL | Status: DC | PRN
  Filled 2021-11-26: qty 1

## 2021-11-26 MED ORDER — NAPROXEN 500 MG PO TABS
500.0000 mg | ORAL_TABLET | Freq: Two times a day (BID) | ORAL | Status: DC | PRN
  Administered 2021-11-26: 500 mg via ORAL
  Filled 2021-11-26 (×3): qty 1

## 2021-11-26 MED ORDER — OXYBUTYNIN CHLORIDE ER 10 MG PO TB24
10.0000 mg | ORAL_TABLET | Freq: Every day | ORAL | Status: DC
  Administered 2021-11-27 – 2021-11-30 (×4): 10 mg via ORAL
  Filled 2021-11-26 (×4): qty 1

## 2021-11-26 MED ORDER — ACETAMINOPHEN 325 MG PO TABS
650.0000 mg | ORAL_TABLET | Freq: Four times a day (QID) | ORAL | Status: DC | PRN
  Administered 2021-11-27 – 2021-11-29 (×2): 650 mg via ORAL
  Filled 2021-11-26 (×2): qty 2

## 2021-11-26 MED ORDER — METFORMIN HCL 500 MG PO TABS
500.0000 mg | ORAL_TABLET | Freq: Two times a day (BID) | ORAL | Status: DC
  Administered 2021-11-26 – 2021-11-30 (×8): 500 mg via ORAL
  Filled 2021-11-26 (×8): qty 1

## 2021-11-26 MED ORDER — MAGNESIUM HYDROXIDE 400 MG/5ML PO SUSP: 30.0000 mL | Freq: Every day | ORAL | Status: DC | PRN

## 2021-11-26 MED ORDER — ONDANSETRON 4 MG PO TBDP: 4.0000 mg | ORAL_TABLET | Freq: Four times a day (QID) | ORAL | Status: DC | PRN

## 2021-11-26 MED ORDER — OXYBUTYNIN CHLORIDE ER 10 MG PO TB24
10.0000 mg | ORAL_TABLET | Freq: Every day | ORAL | Status: DC
  Administered 2021-11-26: 10 mg via ORAL
  Filled 2021-11-26: qty 1

## 2021-11-26 MED ORDER — LEVETIRACETAM 500 MG PO TABS
500.0000 mg | ORAL_TABLET | Freq: Two times a day (BID) | ORAL | Status: DC
  Administered 2021-11-26: 500 mg via ORAL
  Filled 2021-11-26: qty 1

## 2021-11-26 MED ORDER — LOSARTAN POTASSIUM-HCTZ 100-25 MG PO TABS: 1.0000 | ORAL_TABLET | Freq: Every day | ORAL | Status: DC

## 2021-11-26 MED ORDER — LOSARTAN POTASSIUM 25 MG PO TABS
100.0000 mg | ORAL_TABLET | Freq: Every day | ORAL | Status: DC
  Administered 2021-11-27 – 2021-11-30 (×4): 100 mg via ORAL
  Filled 2021-11-26 (×4): qty 4

## 2021-11-26 MED ORDER — LOPERAMIDE HCL 2 MG PO CAPS: 2.0000 mg | ORAL_CAPSULE | ORAL | Status: DC | PRN

## 2021-11-26 MED ORDER — HYDROCHLOROTHIAZIDE 25 MG PO TABS
25.0000 mg | ORAL_TABLET | Freq: Every day | ORAL | Status: DC
  Administered 2021-11-27 – 2021-11-30 (×4): 25 mg via ORAL
  Filled 2021-11-26 (×4): qty 1

## 2021-11-26 MED ORDER — ATORVASTATIN CALCIUM 10 MG PO TABS
40.0000 mg | ORAL_TABLET | Freq: Every day | ORAL | Status: DC
  Administered 2021-11-26 – 2021-11-29 (×4): 40 mg via ORAL
  Filled 2021-11-26 (×5): qty 4

## 2021-11-26 MED ORDER — EMPAGLIFLOZIN 10 MG PO TABS
10.0000 mg | ORAL_TABLET | Freq: Every day | ORAL | Status: DC
  Administered 2021-11-27 – 2021-11-30 (×4): 10 mg via ORAL
  Filled 2021-11-26 (×4): qty 1

## 2021-11-26 MED ORDER — CLONIDINE HCL 0.1 MG PO TABS: 0.1000 mg | ORAL_TABLET | Freq: Every day | ORAL | Status: DC

## 2021-11-26 MED ORDER — ONDANSETRON 4 MG PO TBDP
4.0000 mg | ORAL_TABLET | Freq: Four times a day (QID) | ORAL | Status: DC | PRN
  Administered 2021-11-26: 4 mg via ORAL
  Filled 2021-11-26: qty 1

## 2021-11-26 MED ORDER — LOSARTAN POTASSIUM 50 MG PO TABS
100.0000 mg | ORAL_TABLET | Freq: Every day | ORAL | Status: DC
  Administered 2021-11-26: 100 mg via ORAL
  Filled 2021-11-26: qty 2

## 2021-11-26 MED ORDER — LEVETIRACETAM 500 MG PO TABS
500.0000 mg | ORAL_TABLET | Freq: Two times a day (BID) | ORAL | Status: DC
  Administered 2021-11-26 – 2021-11-30 (×8): 500 mg via ORAL
  Filled 2021-11-26 (×9): qty 1

## 2021-11-26 MED ORDER — ATORVASTATIN CALCIUM 20 MG PO TABS: 40.0000 mg | ORAL_TABLET | Freq: Every day | ORAL | Status: DC

## 2021-11-26 MED ORDER — CLONIDINE HCL 0.1 MG PO TABS
0.1000 mg | ORAL_TABLET | Freq: Four times a day (QID) | ORAL | Status: DC
  Administered 2021-11-26: 0.1 mg via ORAL
  Filled 2021-11-26: qty 1

## 2021-11-26 MED ORDER — HYDROXYZINE HCL 25 MG PO TABS: 25.0000 mg | ORAL_TABLET | Freq: Four times a day (QID) | ORAL | Status: DC | PRN

## 2021-11-26 NOTE — Consult Note (Signed)
[facility] Consult   Reason for Consult:  overdose, intentional Referring Physician:  EDP Patient Identification: Alexander Avery MRN:  [id number] Principal Diagnosis: Major depressive disorder, recurrent severe without psychotic features ([place]) Diagnosis:  Principal Problem:   Major depressive disorder, recurrent severe without psychotic features ([place]) Active Problems:   Polysubstance (including opioids) dependence, daily use ([place])   Total Time spent with patient: 1 hour  Subjective:   Alexander Avery is a 66 y.o. male patient admitted with suicide attempt.  "I tried to commit suicide by taking pills."  HPI:  66 yo male presents to the ED for assistance for depression, reports he overdosed yesterday on 5-6 pills (blood pressure one of these).  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  He is self-medication with heroin, 3-4 bags daily, and cannabis.  Toxicology screen also positive for cocaine.  No current detox symptoms besides fatigue.  No homicidal ideations, hallucinations, or paranoia.  Admitted to [facility] in December for similar symptoms.  Recommended for geriatric psych.   Past Psychiatric History: depression, anxiety, polysubstance use d/o  Risk to Self:  yes Risk to Others:  none Prior Inpatient Therapy:  [facility] Prior Outpatient Therapy:  none   Past Medical History:  Past Medical History:  Diagnosis Date   Anxiety    Arthritis    Cancer ([place])    prostate   Cataract 2019   Diabetes mellitus without complication (HCC)    GERD (gastroesophageal reflux disease)    Hernia of abdominal wall 2015   History of stomach ulcers    Hypertension    Prostate cancer ([place])    Seizures ([place])    Sleep apnea     Past Surgical History:  Procedure Laterality Date   CATARACT EXTRACTION W/PHACO Right 03/30/2018   Procedure: CATARACT EXTRACTION PHACO AND INTRAOCULAR LENS PLACEMENT ([place]);  Surgeon: Eulogio Bear, MD;  Location: [facility] ORS;  Service: Ophthalmology;  Laterality: Right;  [place] 01:09.5 AP% 10.4 CDE 7.98 Fluid pack lot # [id number]   CATARACT EXTRACTION W/PHACO Left 06/29/2018   Procedure: CATARACT EXTRACTION PHACO AND INTRAOCULAR LENS PLACEMENT (IOC);  Surgeon: Eulogio Bear, MD;  Location: [facility] ORS;  Service: Ophthalmology;  Laterality: Left;  [place] 01:27.7 AP% 8.2 CDE 7.27 Fluid pack lot # [id number]   EYE SURGERY     HERNIA REPAIR     PROSTATE SURGERY     PROSTATE SURGERY  2016   Family History:  Family History  Problem Relation Age of Onset   Cancer Mother    Family Psychiatric  History: none Social History:  Social History   Substance and Sexual Activity  Alcohol Use Yes   Alcohol/week: 2.0 standard drinks   Types: 2 Cans of beer per week   Comment: 2 beers twice a week     Social History   Substance and Sexual Activity  Drug Use No    Social History   Socioeconomic History   Marital status: Single    Spouse name: Not on file   Number of children: Not on file   Years of education: Not on file   Highest education level: Not on file  Occupational History   Not on file  Tobacco Use   Smoking status: Some Days    Packs/day: 2.00    Types: Cigarettes   Smokeless tobacco: Never  Vaping Use   Vaping Use: Never used  Substance and Sexual Activity   Alcohol use: Yes  Alcohol/week: 2.0 standard drinks    Types: 2 Cans of beer per week    Comment: 2 beers twice a week   Drug use: No   Sexual activity: Not on file  Other Topics Concern   Not on file  Social History Narrative   Not on file   Social Determinants of Health   Financial Resource Strain: Not on file  Food Insecurity: Not on file  Transportation Needs: Not on file  Physical Activity: Not on file  Stress: Not on file  Social Connections: Not on file   Additional Social History:    Allergies:   Allergies  Allergen Reactions   Penicillins Hives and Other (See Comments)    Urinate on  self, pass out Has patient had a PCN reaction causing immediate rash, facial/tongue/throat swelling, SOB or lightheadedness with hypotension: Yes Has patient had a PCN reaction causing severe rash involving mucus membranes or skin necrosis: No Has patient had a PCN reaction that required hospitalization: Yes- In hospital Has patient had a PCN reaction occurring within the last 10 years: Yes If all of the above answers are "NO", then may proceed with Cephalosporin use.     Labs:  Results for orders placed or performed during the hospital encounter of 11/26/21 (from the past 48 hour(s))  Comprehensive metabolic panel     Status: Abnormal   Collection Time: 11/26/21 11:15 AM  Result Value Ref Range   Sodium 138 135 - 145 mmol/L   Potassium 3.2 (L) 3.5 - 5.1 mmol/L   Chloride 100 98 - 111 mmol/L   CO2 30 22 - 32 mmol/L   Glucose, Bld 98 70 - 99 mg/dL    Comment: Glucose reference range applies only to samples taken after fasting for at least 8 hours.   BUN 17 8 - 23 mg/dL   Creatinine, Ser 1.36 (H) 0.61 - 1.24 mg/dL   Calcium 9.1 8.9 - 10.3 mg/dL   Total Protein 7.5 6.5 - 8.1 g/dL   Albumin 3.5 3.5 - 5.0 g/dL   AST 22 15 - 41 U/L   ALT 14 0 - 44 U/L   Alkaline Phosphatase 60 38 - 126 U/L   Total Bilirubin 0.6 0.3 - 1.2 mg/dL   GFR, Estimated 57 (L) >60 mL/min    Comment: (NOTE) Calculated using the CKD-EPI Creatinine Equation (2021)    Anion gap 8 5 - 15    Comment: Performed at [facility], [address] [place], [place] [address]  Ethanol     Status: None   Collection Time: 11/26/21 11:15 AM  Result Value Ref Range   Alcohol, Ethyl (B) <10 <10 mg/dL    Comment: (NOTE) Lowest detectable limit for serum alcohol is 10 mg/dL.  For medical purposes only. Performed at [facility], [address] [place], [place] [address]   Salicylate level     Status: Abnormal   Collection Time: 11/26/21 11:15 AM  Result Value Ref Range   Salicylate Lvl <9.4 (L) 7.0  - 30.0 mg/dL    Comment: Performed at [facility], [address] [place], [place] [address]  Acetaminophen level     Status: Abnormal   Collection Time: 11/26/21 11:15 AM  Result Value Ref Range   Acetaminophen (Tylenol), Serum <10 (L) 10 - 30 ug/mL    Comment: (NOTE) Therapeutic concentrations vary significantly. A range of 10-30 ug/mL  may be an effective concentration for many patients. However, some  are best treated at concentrations outside  of this range. Acetaminophen concentrations >150 ug/mL at 4 hours after ingestion  and >50 ug/mL at 12 hours after ingestion are often associated with  toxic reactions.  Performed at [facility], [address] [place], [place] [address]   cbc     Status: Abnormal   Collection Time: 11/26/21 11:15 AM  Result Value Ref Range   WBC 5.9 4.0 - 10.5 K/uL   RBC 3.73 (L) 4.22 - 5.81 MIL/uL   Hemoglobin 11.3 (L) 13.0 - 17.0 g/dL   HCT 31.8 (L) 39.0 - 52.0 %   MCV 85.3 80.0 - 100.0 fL   MCH 30.3 26.0 - 34.0 pg   MCHC 35.5 30.0 - 36.0 g/dL   RDW 14.7 11.5 - 15.5 %   Platelets 193 150 - 400 K/uL   nRBC 0.0 0.0 - 0.2 %    Comment: Performed at [facility], [address] [place], [place] [address]  Urine Drug Screen, Qualitative     Status: Abnormal   Collection Time: 11/26/21 11:36 AM  Result Value Ref Range   Tricyclic, Ur Screen POSITIVE (A) NONE DETECTED   Amphetamines, Ur Screen NONE DETECTED NONE DETECTED   MDMA (Ecstasy)Ur Screen NONE DETECTED NONE DETECTED   Cocaine Metabolite,Ur [place] POSITIVE (A) NONE DETECTED   Opiate, Ur Screen POSITIVE (A) NONE DETECTED   Phencyclidine (PCP) Ur S NONE DETECTED NONE DETECTED   Cannabinoid 50 Ng, Ur [place] POSITIVE (A) NONE DETECTED   Barbiturates, Ur Screen NONE DETECTED NONE DETECTED   Benzodiazepine, Ur Scrn NONE DETECTED NONE DETECTED   Methadone Scn, Ur NONE DETECTED NONE DETECTED    Comment: (NOTE) Tricyclics + metabolites, urine    Cutoff 1000  ng/mL Amphetamines + metabolites, urine  Cutoff 1000 ng/mL MDMA (Ecstasy), urine              Cutoff 500 ng/mL Cocaine Metabolite, urine          Cutoff 300 ng/mL Opiate + metabolites, urine        Cutoff 300 ng/mL Phencyclidine (PCP), urine         Cutoff 25 ng/mL Cannabinoid, urine                 Cutoff 50 ng/mL Barbiturates + metabolites, urine  Cutoff 200 ng/mL Benzodiazepine, urine              Cutoff 200 ng/mL Methadone, urine                   Cutoff 300 ng/mL  The urine drug screen provides only a preliminary, unconfirmed analytical test result and should not be used for non-medical purposes. Clinical consideration and professional judgment should be applied to any positive drug screen result due to possible interfering substances. A more specific alternate chemical method must be used in order to obtain a confirmed analytical result. Gas chromatography / mass spectrometry (GC/MS) is the preferred confirm atory method. Performed at [facility], [address] [place], [place] [address]     Current Facility-Administered Medications  Medication Dose Route Frequency Provider Last Rate Last Admin   atorvastatin (LIPITOR) tablet 40 mg  40 mg Oral QHS Vladimir Crofts, MD       empagliflozin (JARDIANCE) tablet 10 mg  10 mg Oral Daily Vladimir Crofts, MD       losartan (COZAAR) tablet 100 mg  100 mg Oral Daily Vladimir Crofts, MD       And   hydrochlorothiazide (HYDRODIURIL) tablet 25 mg  25  mg Oral Daily Vladimir Crofts, MD       levETIRAcetam (KEPPRA) tablet 500 mg  500 mg Oral BID Vladimir Crofts, MD       metFORMIN (GLUCOPHAGE) tablet 500 mg  500 mg Oral BID WC Vladimir Crofts, MD       oxybutynin (DITROPAN-XL) 24 hr tablet 10 mg  10 mg Oral Daily Vladimir Crofts, MD       Current Outpatient Medications  Medication Sig Dispense Refill   acetaminophen (TYLENOL) 325 MG tablet Take 650 mg by mouth every 6 (six) hours as needed for moderate pain or headache.     atorvastatin (LIPITOR)  40 MG tablet Take 40 mg by mouth at bedtime.  4   canagliflozin (INVOKANA) 300 MG TABS tablet Take 300 mg by mouth daily before breakfast.     Difluprednate 0.05 % EMUL Place 1 drop into the right eye 2 (two) times daily.      hydrocortisone (ANUSOL-HC) 25 MG suppository Place 1 suppository (25 mg total) rectally every 12 (twelve) hours. 12 suppository 1   hydrocortisone 2.5 % cream Apply topically 2 (two) times daily. 30 g 1   JARDIANCE 10 MG TABS tablet TAKE 1 TABLET BY MOUTH IN THE MORNING FOR DIABETES     levETIRAcetam (KEPPRA) 500 MG tablet Take 1 tablet (500 mg total) by mouth 2 (two) times daily. 60 tablet 1   losartan-hydrochlorothiazide (HYZAAR) 100-25 MG tablet Take 1 tablet by mouth daily.     meclizine (ANTIVERT) 12.5 MG tablet Take 1 tablet (12.5 mg total) by mouth 2 (two) times daily as needed for dizziness. 10 tablet 0   metFORMIN (GLUCOPHAGE) 500 MG tablet Take 500 mg by mouth 2 (two) times daily with a meal.     oxybutynin (DITROPAN-XL) 10 MG 24 hr tablet Take 1 tablet (10 mg total) by mouth daily. 90 tablet 0   sildenafil (REVATIO) 20 MG tablet Take 3 to 5 tablets two hours before intercouse on an empty stomach.  Do not take with nitrates. 50 tablet 3   sildenafil (VIAGRA) 100 MG tablet Take 1 tablet (100 mg total) by mouth daily as needed for erectile dysfunction. Take two hours prior to intercourse on an empty stomach 30 tablet 3    Musculoskeletal: Strength & Muscle Tone: within normal limits Gait & Station: normal Patient leans: N/A  Psychiatric Specialty Exam: Physical Exam Vitals and nursing note reviewed.  Constitutional:      Appearance: Normal appearance.  HENT:     Head: Normocephalic.     Nose: Nose normal.  Pulmonary:     Effort: Pulmonary effort is normal.  Musculoskeletal:        General: Normal range of motion.     Cervical back: Normal range of motion.  Neurological:     General: No focal deficit present.     Mental Status: He is alert and oriented  to person, place, and time.  Psychiatric:        Attention and Perception: Attention and perception normal.        Mood and Affect: Mood is anxious and depressed.        Speech: Speech normal.        Behavior: Behavior normal. Behavior is cooperative.        Thought Content: Thought content includes suicidal ideation. Thought content includes suicidal plan.        Cognition and Memory: Cognition and memory normal.        Judgment: Judgment normal.  Review of Systems  Constitutional:  Positive for malaise/fatigue.  Psychiatric/Behavioral:  Positive for depression, substance abuse and suicidal ideas. The patient is nervous/anxious.   All other systems reviewed and are negative.  Blood pressure (!) 190/91, pulse 65, temperature 98.8 F (37.1 C), temperature source Oral, resp. rate 16, height 5\' 6"  (1.676 m), weight 51.7 kg, SpO2 97 %.Body mass index is 18.4 kg/m.  General Appearance: Casual  Eye Contact:  Fair  Speech:  Normal Rate  Volume:  Decreased  Mood:  Anxious and Depressed  Affect:  Congruent  Thought Process:  Coherent and Descriptions of Associations: Intact  Orientation:  Full (Time, Place, and Person)  Thought Content:  Rumination  Suicidal Thoughts:  Yes.  with intent/plan  Homicidal Thoughts:  No  Memory:  Immediate;   Fair Recent;   Fair Remote;   Fair  Judgement:  Poor  Insight:  Fair  Psychomotor Activity:  Decreased  Concentration:  Concentration: Fair and Attention Span: Fair  Recall:  [employer] of Knowledge:  Fair  Language:  Good  Akathisia:  No  Handed:  Right  AIMS (if indicated):     Assets:  Housing Leisure Time Resilience  ADL's:  Intact  Cognition:  WNL  Sleep:        Physical Exam: Physical Exam Vitals and nursing note reviewed.  Constitutional:      Appearance: Normal appearance.  HENT:     Head: Normocephalic.     Nose: Nose normal.  Pulmonary:     Effort: Pulmonary effort is normal.  Musculoskeletal:        General: Normal  range of motion.     Cervical back: Normal range of motion.  Neurological:     General: No focal deficit present.     Mental Status: He is alert and oriented to person, place, and time.  Psychiatric:        Attention and Perception: Attention and perception normal.        Mood and Affect: Mood is anxious and depressed.        Speech: Speech normal.        Behavior: Behavior normal. Behavior is cooperative.        Thought Content: Thought content includes suicidal ideation. Thought content includes suicidal plan.        Cognition and Memory: Cognition and memory normal.        Judgment: Judgment normal.   Review of Systems  Constitutional:  Positive for malaise/fatigue.  Psychiatric/Behavioral:  Positive for depression, substance abuse and suicidal ideas. The patient is nervous/anxious.   All other systems reviewed and are negative. Blood pressure (!) 190/91, pulse 65, temperature 98.8 F (37.1 C), temperature source Oral, resp. rate 16, height 5\' 6"  (1.676 m), weight 51.7 kg, SpO2 97 %. Body mass index is 18.4 kg/m.  Treatment Plan Summary: Daily contact with patient to assess and evaluate symptoms and progress in treatment, Medication management, and Plan : Major depressive disorder, recurrent, severe without psychosis: Admit to inpatient geriatric psych  Opiate use d/o: -Started Clonidine opiate withdrawal symptoms  Disposition: Recommend psychiatric Inpatient admission when medically cleared.  Waylan Boga, NP 11/26/2021 1:49 PM

## 2021-11-26 NOTE — ED Triage Notes (Addendum)
Pt to ER under IVC w/BPD (from [facility]). Reports he has been feeling suicidal x2 days. Pt reports taking his BP meds, anxiety meds, and "sleeping pills" in an attempt to hurt himself yesterday. Reports doubling the dose of each.   Per IVC paperwork he states he will probably act on his thoughts if he feels as bad as yesterday and feels like he needs to be somewhere where he can be monitored.   Patient reports increasing stress and recent diagnosis of prostate cancer.   Pt also reports using heroin- last use was yesterday.

## 2021-11-26 NOTE — ED Notes (Signed)
Pt given lunch tray.

## 2021-11-26 NOTE — ED Notes (Signed)
Report given to [facility] in geropsych

## 2021-11-26 NOTE — Progress Notes (Signed)
Pt lying in bed with eyes closed, resps even and unlabored; easily aroused when name called. Pt states "I feel aright." Pt denies pain and SI/HI/AVH at this time. Pt reports that he has not been sleeping well and has trouble falling and staying asleep. He describes his appetite as "good". Pt denies anxiety and depression at this time. He states that his last BM was yesterday. No acute distress noted.

## 2021-11-26 NOTE — BH Assessment (Addendum)
Patient is to be admitted to [facility] by Dr.  Louis Meckel .  Attending Physician will be Dr.  Louis Meckel .   Patient has been assigned to room 27, by [facility] Nurse, Estill Bamberg.    ER staff is aware of the admission: Luann, ER Secretary   Dr. Tamala Julian, ER MD  Jenetta Downer, Patient's Nurse  Seth Bake, Patient Access.

## 2021-11-26 NOTE — Plan of Care (Signed)
°  Problem: Education: Goal: Knowledge of [place] [facility] Education information/materials will improve Outcome: Not Progressing Goal: Emotional status will improve Outcome: Not Progressing Goal: Mental status will improve Outcome: Not Progressing Goal: Verbalization of understanding the information provided will improve Outcome: Not Progressing   Problem: Activity: Goal: Interest or engagement in activities will improve Outcome: Not Progressing Goal: Sleeping patterns will improve Outcome: Not Progressing   Problem: Coping: Goal: Ability to verbalize frustrations and anger appropriately will improve Outcome: Not Progressing Goal: Ability to demonstrate self-control will improve Outcome: Not Progressing   Problem: Safety: Goal: Periods of time without injury will increase Outcome: Not Progressing

## 2021-11-26 NOTE — ED Notes (Signed)
Patient's phone numbers. [phone]

## 2021-11-26 NOTE — ED Notes (Signed)
Patient dressed out at this time with this RN/ Mel EDT. Patient belongings- - [employer] Shark boots  - White socks - Blue Jeans - Black Sweat pants - Wallet/ Keys - [place]

## 2021-11-26 NOTE — BH Assessment (Signed)
Comprehensive Clinical Assessment (CCA) Note  11/26/2021 Alexander Avery [id number]  Alexander Avery, 66 year old male who presents to [facility] ED involuntarily for treatment. Per triage note, Pt to ER under IVC w/BPD (from [facility]). Reports he has been feeling suicidal x2 days. Pt reports taking his BP meds, anxiety meds, and "sleeping pills" in an attempt to hurt himself yesterday. Reports doubling the dose of each. Per IVC paperwork he states he will probably act on his thoughts if he feels as bad as yesterday and feels like he needs to be somewhere where he can be monitored. Patient reports increasing stress and recent diagnosis of prostate cancer. Pt also reports using heroin- last use was yesterday.   During TTS assessment pt presents alert and oriented x 4, anxious but cooperative, and mood-congruent with affect. The pt does not appear to be responding to internal or external stimuli. Neither is the pt presenting with any delusional thinking. Pt verified the information provided to triage RN.   Pt identifies his main complaint to be that he took several different prescription medications to hurt himself. Patient reports this is his 2nd attempt.  Patient states he has prostate cancer and completed surgery about a year ago. Patient admits to using 3-4 bags of heroin daily, marijuana, and drink 3 beers each day. Patient reports going to [employer] last month for similar symptoms. Pt denies HI/AH/VH. Pt is unable to contract for safety.   Per Roselyn Reef, NP, pt is recommended for geriatric psych admission.    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 No data  recorded Practice/Facility Name: No data recorded Practice/Facility Phone Number: No data recorded Name of Contact: No data recorded Contact Number: No data recorded Contact Fax Number: No data recorded Prescriber Name: No data recorded Prescriber Address (if known): No data recorded  What Is the Reason for Your Visit/Call Today? Patient was brought to the ED due to suicidal attempt. Patient reports he took several different pills.  How Long Has This Been Causing You Problems? > than 6 months  What Do You Feel Would Help You the Most Today? Treatment for Depression or other mood problem; Medication(s); Stress Management   Have You Recently Been in Any Inpatient Treatment (Hospital/Detox/Crisis Center/28-Day Program)? No data recorded Name/Location of Program/Hospital:No data recorded How Long Were You There? No data recorded When Were You Discharged? No data recorded  Have You Ever Received Services From [facility] Before? No data recorded Who Do You See at [facility]? No data recorded  Have You Recently Had Any Thoughts About Hurting Yourself? Yes  Are You Planning to Commit Suicide/Harm Yourself At This time? No   Have you Recently Had Thoughts About [place]? No  Explanation: No data recorded  Have You Used Any Alcohol or Drugs in the Past 24 Hours? Yes  How Long Ago Did You Use Drugs or Alcohol? No data recorded What Did You Use and How Much? Heroin   Do You Currently Have a Therapist/Psychiatrist? No  Name of Therapist/Psychiatrist: No data recorded  Have You Been Recently Discharged From Any Office Practice or Programs? No  Explanation of Discharge From Practice/Program: No data  recorded    CCA Screening Triage Referral Assessment Type of Contact: Face-to-Face  Is this Initial or Reassessment? No data recorded Date Telepsych consult ordered in [facility]:  No data recorded Time Telepsych consult ordered in [facility]:  No data recorded  Patient Reported  Information Reviewed? No data recorded Patient Left Without Being Seen? No data recorded Reason for Not Completing Assessment: No data recorded  Collateral Involvement: None provided   Does Patient Have a [place]? No data recorded Name and Contact of Legal Guardian: No data recorded If Minor and Not Living with Parent(s), Who has Custody? n/a  Is CPS involved or ever been involved? Never  Is APS involved or ever been involved? Never   Patient Determined To Be At Risk for Harm To Self or Others Based on Review of Patient Reported Information or Presenting Complaint? No  Method: No data recorded Availability of Means: No data recorded Intent: No data recorded Notification Required: No data recorded Additional Information for Danger to Others Potential: No data recorded Additional Comments for Danger to Others Potential: No data recorded Are There Guns or Other Weapons in Your Home? No data recorded Types of Guns/Weapons: No data recorded Are These Weapons Safely Secured?                            No data recorded Who Could Verify You Are Able To Have These Secured: No data recorded Do You Have any Outstanding Charges, Pending Court Dates, Parole/Probation? No data recorded Contacted To Inform of Risk of Harm To Self or Others: No data recorded  [facility] of Assessment[facility] ED   Does Patient Present under Involuntary Commitment? Yes  IVC Papers Initial File Date: 11/26/21   [place] of Residence: [place]   Patient Currently Receiving the Following Services: Not Receiving Services   Determination of Need: Urgent (48 hours)   Options For Referral: Medication Management; Chemical Dependency Intensive Outpatient Therapy (CDIOP); Geropsychiatric Facility     CCA Biopsychosocial Intake/Chief Complaint:  No data recorded Current Symptoms/Problems: No data recorded  Patient Reported Schizophrenia/Schizoaffective Diagnosis in Past:  No   Strengths: Patient is able to communicate his needs.  Preferences: No data recorded Abilities: No data recorded  Type of Services Patient Feels are Needed: No data recorded  Initial Clinical Notes/Concerns: No data recorded  Mental Health Symptoms Depression:   Change in energy/activity; Worthlessness; Difficulty Concentrating   Duration of Depressive symptoms:  Greater than two weeks   Mania:   None   Anxiety:    Difficulty concentrating; Irritability; Restlessness   Psychosis:   None   Duration of Psychotic symptoms: No data recorded  Trauma:   None   Obsessions:   None   Compulsions:   None   Inattention:   None   Hyperactivity/Impulsivity:   None   Oppositional/Defiant Behaviors:   None   Emotional Irregularity:   Potentially harmful impulsivity; Recurrent suicidal behaviors/gestures/threats   Other Mood/Personality Symptoms:  No data recorded   Mental Status Exam Appearance and self-care  Stature:   Average   Weight:   Average weight   Clothing:   Casual   Grooming:   Normal   Cosmetic use:   None   Posture/gait:   Normal   Motor activity:   Not Remarkable   Sensorium  Attention:   Normal   Concentration:   Normal   Orientation:   X5   Recall/memory:   Normal   Affect  and Mood  Affect:   Appropriate   Mood:   Depressed; Anxious   Relating  Eye contact:   Normal   Facial expression:   Responsive   Attitude toward examiner:   Cooperative   Thought and Language  Speech flow:  Clear and Coherent   Thought content:   Appropriate to Mood and Circumstances   Preoccupation:   None   Hallucinations:   None   Organization:  No data recorded  [employer] of Knowledge:   Fair   Intelligence:   Average   Abstraction:   Normal   Judgement:   Fair   Art therapist:   Realistic   Insight:   Fair   Decision Making:   Normal   Social Functioning  Social Maturity:    Isolates   Social Judgement:   Normal   Stress  Stressors:   Illness   Coping Ability:   Advice worker Deficits:   None   Supports:   Support needed     Religion:    Leisure/Recreation:    Exercise/Diet:     CCA Employment/Education Employment/Work Situation: Employment / Work Technical sales engineer: On disability Why is Patient on Disability: Cancer How Long has Patient Been on Disability: Unknown Patient's Job has Been Impacted by Current Illness: No  Education:     CCA Family/Childhood History Family and Relationship History: Family history Marital status: Single Does patient have children?: No  Childhood History:     Child/Adolescent Assessment:     CCA Substance Use Alcohol/Drug Use: Alcohol / Drug Use Pain Medications: See MAR Prescriptions: See MAR Over the Counter: See MAR History of alcohol / drug use?: Yes Longest period of sobriety (when/how long): Heroin Withdrawal Symptoms: Agitation Substance #1 Name of Substance 1: Heroin 1 - Amount (size/oz): 3-4 bags 1 - Frequency: daily 1 - Last Use / Amount: 11/25/21                       ASAM's:  Six Dimensions of Multidimensional Assessment  Dimension 1:  Acute Intoxication and/or Withdrawal Potential:      Dimension 2:  Biomedical Conditions and Complications:      Dimension 3:  Emotional, Behavioral, or Cognitive Conditions and Complications:     Dimension 4:  Readiness to Change:     Dimension 5:  Relapse, Continued use, or Continued Problem Potential:     Dimension 6:  Recovery/Living Environment:     ASAM Severity Score:    ASAM Recommended Level of Treatment:     Substance use Disorder (SUD) Substance Use Disorder (SUD)  Checklist Symptoms of Substance Use: Continued use despite having a persistent/recurrent physical/psychological problem caused/exacerbated by use, Evidence of tolerance, Evidence of withdrawal (Comment), Presence of craving or  strong urge to use, Social, occupational, recreational activities given up or reduced due to use, Continued use despite persistent or recurrent social, interpersonal problems, caused or exacerbated by use, Large amounts of time spent to obtain, use or recover from the substance(s), Persistent desire or unsuccessful efforts to cut down or control use, Recurrent use that results in a failure to fulfill major role obligations (work, school, home)  Recommendations for Services/Supports/Treatments: Recommendations for Services/Supports/Treatments Recommendations For Services/Supports/Treatments: Detox  DSM5 Diagnoses: Patient Active Problem List   Diagnosis Date Noted   Major depressive disorder, recurrent severe without psychotic features ([place]) 11/26/2021   Polysubstance (including opioids) dependence, daily use ([place]) 11/26/2021   Polysubstance abuse ([place]) 04/18/2020  Prostate cancer [facility]) 02/20/2019    Patient Centered Plan: Patient is on the following Treatment Plan(s):  Depression   Referrals to Alternative Service(s): Referred to Alternative Service(s):   Place:   Date:   Time:    Referred to Alternative Service(s):   Place:   Date:   Time:    Referred to Alternative Service(s):   Place:   Date:   Time:    Referred to Alternative Service(s):   Place:   Date:   Time:     Milynn Quirion Glennon Mac, Counselor, LCAS-A

## 2021-11-26 NOTE — Progress Notes (Signed)
Patient admitted IVC to [facility] from [facility] ED with diagnosis of worsening depression and attempted suicide.  Patient presents to unit ambulatory A&Ox4.  Pt reports he overdosed on sleeping and antihypertensive meds stating, "I have prostate cancer and just wanted to die" Patient endorses depression and anxiety rating both 8/10.  Pt states his main stressors are living alone and prostate cancer diagnosis.  Patient currently denies suicidal ideations, homicidal ideations, audio or visual hallucinations and verbally contracts for safety on unit.  Reports chronic stomach pain 8/10.  Patient endorsees drinking 3 beers a day, snorting heroin daily, marijuana use 2-3 x/ week and smoking 3 cigarettes daily. Patient reports living alone, has one son and 3 grand kids in [place].   Emotional support and reassurance provided throughout admission intake. Afterwards, oriented patient to unit, room and call light, reviewed POC with all questions answered and understanding verbalzied.  Pt compliant with all meds, swallows without difficulty. Patient's gait is steady and denies need for assistive device.   Will continue to monitor with ongoing Q 15 minute safety checks per unit protocol.

## 2021-11-26 NOTE — ED Provider Notes (Signed)
[facility] Provider Note    Event Date/Time   First MD Initiated Contact with Patient 11/26/21 1126     (approximate)   History   Psychiatric Evaluation   HPI  Alexander Avery is a 66 y.o. male who presents to the ED for evaluation of Psychiatric Evaluation   I reviewed [facility] ED visits from November of last year where he was seen for polysubstance abuse and hypertension.  Patient presents to the ED under IVC for evaluation of his mental health in the setting of acute suicidality.  He reports taking double dose of his normal sleeping medicine and blood pressure medicine yesterday trying to hurt himself.  Reports no overdosing or taking of any pills today.  Reports also "getting back into" heroin, which he snorts, denies IVDU.  He reports increasing psychosocial stressors with recent diagnosis of prostate cancer and feeling exasperated and with not many options in his situation.   Physical Exam   Triage Vital Signs: ED Triage Vitals [11/26/21 1112]  Enc Vitals Group     BP (!) 190/91     Pulse Rate 65     Resp 16     Temp 98.8 F (37.1 C)     Temp Source Oral     SpO2 97 %     Weight 113 lb 15.7 oz (51.7 kg)     Height 5\' 6"  (1.676 m)     Head Circumference      Peak Flow      Pain Score 5     Pain Loc      Pain Edu?      Excl. in [place]?     Most recent vital signs: Vitals:   11/26/21 1112  BP: (!) 190/91  Pulse: 65  Resp: 16  Temp: 98.8 F (37.1 C)  SpO2: 97%    General: Awake, no distress.  CV:  Good peripheral perfusion.  Resp:  Normal effort.  Abd:  No distention.  MSK:  No deformity noted.  Neuro:  No focal deficits appreciated. Other:  No signs of opiate withdrawals or toxidromes.   ED Results / Procedures / Treatments   Labs (all labs ordered are listed, but only abnormal results are displayed) Labs Reviewed  COMPREHENSIVE METABOLIC PANEL - Abnormal; Notable for the following components:      Result Value    Potassium 3.2 (*)    Creatinine, Ser 1.36 (*)    GFR, Estimated 57 (*)    All other components within normal limits  SALICYLATE LEVEL - Abnormal; Notable for the following components:   Salicylate Lvl <8.8 (*)    All other components within normal limits  ACETAMINOPHEN LEVEL - Abnormal; Notable for the following components:   Acetaminophen (Tylenol), Serum <10 (*)    All other components within normal limits  CBC - Abnormal; Notable for the following components:   RBC 3.73 (*)    Hemoglobin 11.3 (*)    HCT 31.8 (*)    All other components within normal limits  URINE DRUG SCREEN, QUALITATIVE ([facility] ONLY) - Abnormal; Notable for the following components:   Tricyclic, Ur Screen POSITIVE (*)    Cocaine Metabolite,Ur [place] POSITIVE (*)    Opiate, Ur Screen POSITIVE (*)    Cannabinoid 50 Ng, Ur [place] POSITIVE (*)    All other components within normal limits  ETHANOL    EKG    RADIOLOGY   Official radiology report(s): No results found.  PROCEDURES and INTERVENTIONS:  Procedures  Medications - No data to display   IMPRESSION / MDM / [place] / ED COURSE  I reviewed the triage vital signs and the nursing notes.  66 year old male presents to the ED under IVC due to suicidality.  He reports "overdosing" on medications yesterday, but I see no evidence of acute toxidromes or signs of this.  He looks clinically well to me.  Blood work shows his CKD around baseline.  CBC with very slight hemoglobin drop, and he has no symptoms of blood loss.  We will continue to monitor for this.  I see no barriers to psychiatric evaluation and disposition.  We will uphold IVC due to his continued thoughts of harming himself with a plan, and have psychiatry/TTS come to the patient.  We will reorder his home meds  Clinical Course as of 11/26/21 1257  Thu Nov 26, 2021  1257 The patient has been placed in psychiatric observation due to the need to provide a safe environment for the patient while  obtaining psychiatric consultation and evaluation, as well as ongoing medical and medication management to treat the patient's condition.  The patient has been placed under full IVC at this time.   [DS]    Clinical Course User Index [DS] Vladimir Crofts, MD     FINAL CLINICAL IMPRESSION(S) / ED DIAGNOSES   Final diagnoses:  None     Rx / DC Orders   ED Discharge Orders     None        Note:  This document was prepared using Dragon voice recognition software and may include unintentional dictation errors.   Vladimir Crofts, MD 11/26/21 1304

## 2021-11-27 DIAGNOSIS — F332 Major depressive disorder, recurrent severe without psychotic features: Principal | ICD-10-CM

## 2021-11-27 MED ORDER — ADULT MULTIVITAMIN W/MINERALS CH
1.0000 | ORAL_TABLET | Freq: Every day | ORAL | Status: DC
  Administered 2021-11-27 – 2021-11-29 (×3): 1 via ORAL
  Filled 2021-11-27 (×4): qty 1

## 2021-11-27 MED ORDER — MIRTAZAPINE 15 MG PO TABS
15.0000 mg | ORAL_TABLET | Freq: Every day | ORAL | Status: DC
  Administered 2021-11-27 – 2021-11-29 (×3): 15 mg via ORAL
  Filled 2021-11-27 (×3): qty 1

## 2021-11-27 MED ORDER — RISPERIDONE 1 MG PO TABS
0.5000 mg | ORAL_TABLET | ORAL | Status: DC
  Administered 2021-11-27 – 2021-11-30 (×7): 0.5 mg via ORAL
  Filled 2021-11-27 (×7): qty 1

## 2021-11-27 MED ORDER — LORAZEPAM 1 MG PO TABS
1.0000 mg | ORAL_TABLET | Freq: Two times a day (BID) | ORAL | Status: DC
  Administered 2021-11-27 – 2021-11-30 (×7): 1 mg via ORAL
  Filled 2021-11-27 (×7): qty 1

## 2021-11-27 MED ORDER — GLUCERNA SHAKE PO LIQD
237.0000 mL | Freq: Three times a day (TID) | ORAL | Status: DC
  Administered 2021-11-27 – 2021-11-30 (×10): 237 mL via ORAL
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
## 2021-11-27 NOTE — Progress Notes (Signed)
Patient is A&Ox4. Pt c/o fatigue. Pt denies SI, HI and AVH at this time. Pt is medication complaint. Pt remains safe on the unit at this time.

## 2021-11-27 NOTE — Progress Notes (Signed)
NUTRITION ASSESSMENT  Pt identified as at risk on the Malnutrition Screen Tool  INTERVENTION:  -Glucerna Shake po TID, each supplement provides 220 kcal and 10 grams of protein  -MVI with minerals daily -Double protein portions with meals  NUTRITION DIAGNOSIS: Unintentional weight loss related to sub-optimal intake as evidenced by pt report.   Goal: Pt to meet >/= 90% of their estimated nutrition needs.  Monitor:  PO intake  Assessment:  66 yo male presents to the ED for assistance for depression, reports he overdosed yesterday on 5-6 pills (blood pressure one of these).  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  He is self-medication with heroin, 3-4 bags daily, and cannabis.  Toxicology screen also positive for cocaine.  No current detox symptoms besides fatigue.  No homicidal ideations, hallucinations, or paranoia.  Admitted to [place] in December for similar symptoms.  Recommended for geriatric psych.   Pt admitted with intentional overdose.   Pt with history of polysubstance abuse, consuming 3 beers daily and 3-4 bags of heroin daily.   Pt currently on a carb modified diet. No meal completion data available to assess at this time.   Given underweight status and polysubstance abuse, suspect diet of poor nutritional quality PTA.  Reviewed wt hx; pt has experienced a 5.2% wt loss over the past month, which is significant for time frame.   Suspect pt with malnutrition, however, unable to identify at this time.   Medications reviewed and include keppra  No results found for: HGBA1C PTA DM medications are 10 mg jardiance daily and 500 metformin BID.   Labs reviewed: K: 3.2. Inpatient orders for glycemic control are 10 mg jardiance daily and 500 mg metformin BID.   65 y.o. male  Height: Ht Readings from Last 1 Encounters:  11/26/21 5\' 5"  (1.651 m)    Weight: Wt Readings from Last 1 Encounters:  11/26/21 49 kg    Weight  Hx: Wt Readings from Last 10 Encounters:  11/26/21 49 kg  11/26/21 51.7 kg  10/26/21 51.7 kg  11/18/20 54.9 kg  06/19/20 54 kg  05/23/20 53.2 kg  04/29/20 59 kg  04/26/20 59 kg  04/17/20 60.3 kg  01/31/20 60.9 kg    BMI:  Body mass index is 17.97 kg/m. Pt meets criteria for underweight based on current BMI.  Estimated Nutritional Needs: Kcal: 25-30 kcal/kg Protein: > 1 gram protein/kg Fluid: 1 ml/kcal  Diet Order:  Diet Order             Diet Carb Modified Fluid consistency: Thin; Room service appropriate? Yes  Diet effective now                  Pt is also offered choice of unit snacks mid-morning and mid-afternoon.  Pt is eating as desired.   Lab results and medications reviewed.   Loistine Chance, RD, LDN, [place] Registered Dietitian II Certified Diabetes Care and Education Specialist Please refer to [facility] for RD and/or RD on-call/weekend/after hours pager

## 2021-11-27 NOTE — BHH Suicide Risk Assessment (Signed)
[facility] Admission Suicide Risk Assessment   Nursing information obtained from:  Patient Demographic factors:  Age 65 or older, Living alone, Unemployed, Male Current Mental Status:  NA Loss Factors:  Decline in physical health Historical Factors:  NA Risk Reduction Factors:  NA  Total Time spent with patient: 1 hour Principal Problem: Major depressive disorder, recurrent severe without psychotic features ([place]) Diagnosis:  Principal Problem:   Major depressive disorder, recurrent severe without psychotic features ([place])  Subjective Data:   66 yo male presents to the ED for assistance for depression, reports he overdosed yesterday on 5-6 pills (blood pressure one of these).  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  He is self-medication with heroin, 3-4 bags daily, and cannabis.  Toxicology screen also positive for cocaine.  No current detox symptoms besides fatigue.  No homicidal ideations, hallucinations, or paranoia.  Admitted to [place] in December for similar symptoms.  Recommended for geriatric psych.    Continued Clinical Symptoms:  Alcohol Use Disorder Identification Test Final Score (AUDIT): 35 The "Alcohol Use Disorders Identification Test", Guidelines for Use in Primary Care, Second Edition.  World Pharmacologist [facility]). Score between 0-7:  no or low risk or alcohol related problems. Score between 8-15:  moderate risk of alcohol related problems. Score between 16-19:  high risk of alcohol related problems. Score 20 or above:  warrants further diagnostic evaluation for alcohol dependence and treatment.   CLINICAL FACTORS:   Depression:   Anhedonia Hopelessness Alcohol/Substance Abuse/Dependencies   Musculoskeletal: Strength & Muscle Tone: within normal limits Gait & Station: normal Patient leans: N/A  Psychiatric Specialty Exam:  Presentation  General Appearance: Bizarre  Eye Contact:Minimal  Speech:Garbled  Speech  Volume:Decreased  Handedness:Right   Mood and Affect  Mood:Irritable  Affect:Inappropriate; Congruent   Thought Process  Thought Processes:Coherent  Descriptions of Associations:Intact  Orientation:Full (Time, Place and Person)  Thought Content:Logical  History of Schizophrenia/Schizoaffective disorder:No  Duration of Psychotic Symptoms:No data recorded Hallucinations:No data recorded Ideas of Reference:None  Suicidal Thoughts:No data recorded Homicidal Thoughts:No data recorded  Sensorium  Memory:Immediate Good; Recent Good; Remote Good  Judgment:Fair  Insight:Fair   Executive Functions  Concentration:Fair  Attention Span:Fair  Gray   Psychomotor Activity  Psychomotor Activity:No data recorded  Assets  Assets:Communication Skills; Desire for Improvement; Physical Health; Resilience; Social Support   Sleep  Sleep:No data recorded   Physical Exam: Physical Exam Vitals and nursing note reviewed.  Constitutional:      Appearance: Normal appearance. He is normal weight.  HENT:     Head: Normocephalic and atraumatic.     Nose: Nose normal.     Mouth/Throat:     Pharynx: Oropharynx is clear.  Eyes:     Extraocular Movements: Extraocular movements intact.     Pupils: Pupils are equal, round, and reactive to light.  Cardiovascular:     Rate and Rhythm: Normal rate and regular rhythm.     Pulses: Normal pulses.     Heart sounds: Normal heart sounds.  Pulmonary:     Effort: Pulmonary effort is normal.     Breath sounds: Normal breath sounds.  Abdominal:     General: Abdomen is flat. Bowel sounds are normal.     Palpations: Abdomen is soft.  Genitourinary:    Penis: Normal.      Testes: Normal.     Rectum: Normal.  Musculoskeletal:        General: Normal range of motion.  Cervical back: Normal range of motion and neck supple.  Skin:    General: Skin is warm and dry.  Neurological:      General: No focal deficit present.     Mental Status: He is alert and oriented to person, place, and time.  Psychiatric:        Mood and Affect: Mood normal.        Behavior: Behavior normal.        Thought Content: Thought content normal.        Judgment: Judgment normal.   Review of Systems  Constitutional: Negative.   HENT: Negative.    Eyes: Negative.   Respiratory: Negative.    Cardiovascular: Negative.   Gastrointestinal: Negative.   Genitourinary: Negative.   Musculoskeletal: Negative.   Skin: Negative.   Neurological: Negative.   Endo/Heme/Allergies: Negative.   Psychiatric/Behavioral:  Positive for depression.   Blood pressure (!) 174/84, pulse (!) 55, temperature 97.9 F (36.6 C), temperature source Oral, resp. rate 18, height 5\' 5"  (1.651 m), weight 49 kg, SpO2 100 %. Body mass index is 17.97 kg/m.   COGNITIVE FEATURES THAT CONTRIBUTE TO RISK:  Closed-mindedness    SUICIDE RISK:   Mild:  Suicidal ideation of limited frequency, intensity, duration, and specificity.  There are no identifiable plans, no associated intent, mild dysphoria and related symptoms, good self-control (both objective and subjective assessment), few other risk factors, and identifiable protective factors, including available and accessible social support.  PLAN OF CARE: See orders  I certify that inpatient services furnished can reasonably be expected to improve the patient's condition.   Parks Ranger, DO 11/27/2021, 9:56 AM

## 2021-11-27 NOTE — Progress Notes (Signed)
Recreation Therapy Notes  INPATIENT RECREATION TR PLAN  Patient Details Name: Alexander Avery MRN: [id number] DOB: Aug 24, 1956 Today's Date: 11/27/2021  Rec Therapy Plan Is patient appropriate for Therapeutic Recreation?: Yes Treatment times per week: at least 3 Estimated Length of Stay: 5-7 days TR Treatment/Interventions: Group participation (Comment)  Discharge Criteria Pt will be discharged from therapy if:: Discharged Treatment plan/goals/alternatives discussed and agreed upon by:: Patient/family  Discharge Summary     Rahi Chandonnet 11/27/2021, 3:28 PM

## 2021-11-27 NOTE — Progress Notes (Signed)
Recreation Therapy Notes  INPATIENT RECREATION THERAPY ASSESSMENT  Patient Details Name: Alexander Avery MRN: [id number] DOB: 05-25-1956 Today's Date: 11/27/2021       Information Obtained From: Patient (Patient stated he was too tired.)  Able to Participate in Assessment/Interview:    Patient Presentation:    Reason for Admission (Per Patient):    Patient Stressors:    Coping Skills:      Leisure Interests (2+):     Frequency of Recreation/Participation:    Awareness of Community Resources:     [employer]:     Current Use:    If no, Barriers?:    Expressed Interest in [place] of Residence:     Patient Main Form of Transportation:    Patient Strengths:     Patient Identified Areas of Improvement:     Patient Goal for Hospitalization:     Current SI (including self-harm):     Current HI:     Current AVH:    Staff Intervention Plan:    Consent to Intern Participation:    Katelynn Heidler 11/27/2021, 3:26 PM

## 2021-11-27 NOTE — BHH Counselor (Signed)
CSW made first attempt with pt to complete PSA.   Patient said he was too sleepy and could not wake up enough to complete assessment and requested to complete assessment at another time.  CSW will follow up with pt at a more opportune time to complete assessment.   Ashira Kirsten [place], MSW, LCSW-A 1/13/20231:50 PM

## 2021-11-27 NOTE — BH IP Treatment Plan (Signed)
Interdisciplinary Treatment and Diagnostic Plan Update  11/27/2021 Time of Session: 9:30AM GAUTHAM HEWINS MRN: [id number]  Principal Diagnosis: Major depressive disorder, recurrent severe without psychotic features (HCC)  Secondary Diagnoses: Principal Problem:   Major depressive disorder, recurrent severe without psychotic features (H[facility])   Current Medications:  Current Facility-Administered Medications  Medication Dose Route Frequency Provider Last Rate Last Admin   acetaminophen (TYLENOL) tablet 650 mg  650 mg Oral Q6H PRN Charm Rings, NP   650 mg at 11/27/21 0814   alum & mag hydroxide-simeth (MAALOX/MYLANTA) 200-200-20 MG/5ML suspension 30 mL  30 mL Oral Q4H PRN Charm Rings, NP       atorvastatin (LIPITOR) tablet 40 mg  40 mg Oral QHS Charm Rings, NP   40 mg at 11/26/21 2135   cloNIDine (CATAPRES) tablet 0.1 mg  0.1 mg Oral QID Charm Rings, NP   0.1 mg at 11/26/21 2138   Followed by   Melene Muller ON 11/28/2021] cloNIDine (CATAPRES) tablet 0.1 mg  0.1 mg Oral Lenda Kelp, NP       Followed by   Melene Muller ON 12/01/2021] cloNIDine (CATAPRES) tablet 0.1 mg  0.1 mg Oral QAC breakfast Charm Rings, NP       dicyclomine (BENTYL) tablet 20 mg  20 mg Oral Q6H PRN Charm Rings, NP       empagliflozin (JARDIANCE) tablet 10 mg  10 mg Oral Daily Charm Rings, NP       feeding supplement (GLUCERNA SHAKE) (GLUCERNA SHAKE) liquid 237 mL  237 mL Oral TID BM Sarina Ill, DO       losartan (COZAAR) tablet 100 mg  100 mg Oral Daily Charm Rings, NP       And   hydrochlorothiazide (HYDRODIURIL) tablet 25 mg  25 mg Oral Daily Charm Rings, NP       hydrOXYzine (ATARAX) tablet 25 mg  25 mg Oral Q6H PRN Charm Rings, NP   25 mg at 11/27/21 5645   levETIRAcetam (KEPPRA) tablet 500 mg  500 mg Oral BID Charm Rings, NP   500 mg at 11/26/21 2135   loperamide (IMODIUM) capsule 2-4 mg  2-4 mg Oral PRN Charm Rings, NP       LORazepam (ATIVAN) tablet 1 mg   1 mg Oral BID Sarina Ill, DO       magnesium hydroxide (MILK OF MAGNESIA) suspension 30 mL  30 mL Oral Daily PRN Charm Rings, NP       metFORMIN (GLUCOPHAGE) tablet 500 mg  500 mg Oral BID WC Charm Rings, NP   500 mg at 11/27/21 0756   methocarbamol (ROBAXIN) tablet 500 mg  500 mg Oral Q8H PRN Charm Rings, NP       mirtazapine (REMERON) tablet 15 mg  15 mg Oral QHS Sarina Ill, DO       multivitamin with minerals tablet 1 tablet  1 tablet Oral Daily Sarina Ill, DO       naproxen (NAPROSYN) tablet 500 mg  500 mg Oral BID PRN Charm Rings, NP   500 mg at 11/26/21 1715   ondansetron (ZOFRAN-ODT) disintegrating tablet 4 mg  4 mg Oral Q6H PRN Charm Rings, NP   4 mg at 11/26/21 1658   oxybutynin (DITROPAN-XL) 24 hr tablet 10 mg  10 mg Oral Daily Charm Rings, NP       risperiDONE (RISPERDAL) tablet 0.5 mg  0.5 mg Oral [id number] Parks Ranger, DO       PTA Medications: Medications Prior to Admission  Medication Sig Dispense Refill Last Dose   acetaminophen (TYLENOL) 325 MG tablet Take 650 mg by mouth every 6 (six) hours as needed for moderate pain or headache.      amLODipine (NORVASC) 5 MG tablet Take 5 mg by mouth daily.      atorvastatin (LIPITOR) 40 MG tablet Take 40 mg by mouth at bedtime. (Patient not taking: Reported on 11/26/2021)  4    canagliflozin (INVOKANA) 300 MG TABS tablet Take 300 mg by mouth daily before breakfast. (Patient not taking: Reported on 11/26/2021)      cloNIDine (CATAPRES) 0.1 MG tablet Take 0.1 mg by mouth 2 (two) times daily as needed.      Difluprednate 0.05 % EMUL Place 1 drop into the right eye 2 (two) times daily.  (Patient not taking: Reported on 11/26/2021)      hydrocortisone (ANUSOL-HC) 25 MG suppository Place 1 suppository (25 mg total) rectally every 12 (twelve) hours. 12 suppository 1    hydrocortisone 2.5 % cream Apply topically 2 (two) times daily. (Patient not taking: Reported on 11/26/2021)  30 g 1    JARDIANCE 10 MG TABS tablet TAKE 1 TABLET BY MOUTH IN THE MORNING FOR DIABETES (Patient not taking: Reported on 11/26/2021)      levETIRAcetam (KEPPRA) 500 MG tablet Take 1 tablet (500 mg total) by mouth 2 (two) times daily. (Patient not taking: Reported on 11/26/2021) 60 tablet 1    losartan (COZAAR) 100 MG tablet Take 100 mg by mouth daily. (Patient not taking: Reported on 11/26/2021)      losartan-hydrochlorothiazide (HYZAAR) 100-25 MG tablet Take 1 tablet by mouth daily. (Patient not taking: Reported on 11/26/2021)      meclizine (ANTIVERT) 12.5 MG tablet Take 1 tablet (12.5 mg total) by mouth 2 (two) times daily as needed for dizziness. (Patient not taking: Reported on 11/26/2021) 10 tablet 0    metFORMIN (GLUCOPHAGE) 500 MG tablet Take 500 mg by mouth 2 (two) times daily with a meal. (Patient not taking: Reported on 11/26/2021)      oxybutynin (DITROPAN-XL) 10 MG 24 hr tablet Take 1 tablet (10 mg total) by mouth daily. (Patient not taking: Reported on 11/26/2021) 90 tablet 0    QUEtiapine (SEROQUEL) 100 MG tablet Take 100 mg by mouth at bedtime.      sildenafil (REVATIO) 20 MG tablet Take 3 to 5 tablets two hours before intercouse on an empty stomach.  Do not take with nitrates. (Patient not taking: Reported on 11/26/2021) 50 tablet 3    sildenafil (VIAGRA) 100 MG tablet Take 1 tablet (100 mg total) by mouth daily as needed for erectile dysfunction. Take two hours prior to intercourse on an empty stomach (Patient not taking: Reported on 11/26/2021) 30 tablet 3     Patient Stressors:    Patient Strengths:    Treatment Modalities: Medication Management, Group therapy, Case management,  1 to 1 session with clinician, Psychoeducation, Recreational therapy.   Physician Treatment Plan for Primary Diagnosis: Major depressive disorder, recurrent severe without psychotic features ([place]) Long Term Goal(s): Improvement in symptoms so as ready for discharge   Short Term Goals: Ability to identify  changes in lifestyle to reduce recurrence of condition will improve Ability to verbalize feelings will improve Ability to disclose and discuss suicidal ideas Ability to demonstrate self-control will improve Ability to identify and develop effective coping behaviors will improve Ability  to maintain clinical measurements within normal limits will improve Compliance with prescribed medications will improve Ability to identify triggers associated with substance abuse/mental health issues will improve  Medication Management: Evaluate patient's response, side effects, and tolerance of medication regimen.  Therapeutic Interventions: 1 to 1 sessions, Unit Group sessions and Medication administration.  Evaluation of Outcomes: Not Met  Physician Treatment Plan for Secondary Diagnosis: Principal Problem:   Major depressive disorder, recurrent severe without psychotic features ([place])  Long Term Goal(s): Improvement in symptoms so as ready for discharge   Short Term Goals: Ability to identify changes in lifestyle to reduce recurrence of condition will improve Ability to verbalize feelings will improve Ability to disclose and discuss suicidal ideas Ability to demonstrate self-control will improve Ability to identify and develop effective coping behaviors will improve Ability to maintain clinical measurements within normal limits will improve Compliance with prescribed medications will improve Ability to identify triggers associated with substance abuse/mental health issues will improve     Medication Management: Evaluate patient's response, side effects, and tolerance of medication regimen.  Therapeutic Interventions: 1 to 1 sessions, Unit Group sessions and Medication administration.  Evaluation of Outcomes: Not Met   RN Treatment Plan for Primary Diagnosis: Major depressive disorder, recurrent severe without psychotic features ([place]) Long Term Goal(s): Knowledge of disease and therapeutic  regimen to maintain health will improve  Short Term Goals: Ability to remain free from injury will improve, Ability to demonstrate self-control, Ability to participate in decision making will improve, Ability to verbalize feelings will improve, Ability to disclose and discuss suicidal ideas, Ability to identify and develop effective coping behaviors will improve, and Compliance with prescribed medications will improve  Medication Management: RN will administer medications as ordered by provider, will assess and evaluate patient's response and provide education to patient for prescribed medication. RN will report any adverse and/or side effects to prescribing provider.  Therapeutic Interventions: 1 on 1 counseling sessions, Psychoeducation, Medication administration, Evaluate responses to treatment, Monitor vital signs and CBGs as ordered, Perform/monitor CIWA, COWS, AIMS and Fall Risk screenings as ordered, Perform wound care treatments as ordered.  Evaluation of Outcomes: Not Met   LCSW Treatment Plan for Primary Diagnosis: Major depressive disorder, recurrent severe without psychotic features ([place]) Long Term Goal(s): Safe transition to appropriate next level of care at discharge, Engage patient in therapeutic group addressing interpersonal concerns.  Short Term Goals: Engage patient in aftercare planning with referrals and resources, Increase social support, Increase ability to appropriately verbalize feelings, Increase emotional regulation, Facilitate acceptance of mental health diagnosis and concerns, Identify triggers associated with mental health/substance abuse issues, and Increase skills for wellness and recovery  Therapeutic Interventions: Assess for all discharge needs, 1 to 1 time with Social worker, Explore available resources and support systems, Assess for adequacy in community support network, Educate family and significant other(s) on suicide prevention, Complete Psychosocial  Assessment, Interpersonal group therapy.  Evaluation of Outcomes: Not Met   Progress in Treatment: Attending groups: No. Participating in groups: No. Taking medication as prescribed: Yes. Toleration medication: Yes. Family/Significant other contact made: No, will contact:  when given permission Patient understands diagnosis: Yes. Discussing patient identified problems/goals with staff: Yes. Medical problems stabilized or resolved: Yes. Denies suicidal/homicidal ideation: Yes. Issues/concerns per patient self-inventory: No. Other: None  New problem(s) identified: No, Describe:  none  New Short Term/Long Term Goal(s): Patient to work towards detox, medication management for mood stabilization; elimination of SI thoughts; development of comprehensive mental wellness/sobriety plan.   Patient Goals:  "  stay focused on me, not be down on myself"  Discharge Plan or Barriers: CSW will assist pt in development of appropriate discharge plan  Reason for Continuation of Hospitalization: Depression Medication stabilization Withdrawal symptoms  Estimated Length of Stay: TBD   Scribe for Treatment Team: Delva Derden A [place], Latanya Presser 11/27/2021 10:16 AM

## 2021-11-27 NOTE — Progress Notes (Signed)
Patient is alert and oriented x4. During assessment patient report sleeping on and off. Denies pain, anxiety and depression, but walk up to the nurses station five minute later reporting stomach pain of 7/10 and verbalize anxiety of 5/10. Patient medicated for pain and anxiety. Denies SI, HI, and AVH. Ate breakfast in the day room among staff and peers with good appetite. Remain safe on the unit with 15 minutes safety check.

## 2021-11-28 DIAGNOSIS — F1193 Opioid use, unspecified with withdrawal: Secondary | ICD-10-CM

## 2021-11-28 LAB — LIPID PANEL
Cholesterol: 143 mg/dL (ref 0–200)
HDL: 54 mg/dL (ref >40)
LDL Cholesterol: 73 mg/dL (ref 0–99)
Total CHOL/HDL Ratio: 2.6 RATIO
Triglycerides: 80 mg/dL (ref <150)
VLDL: 16 mg/dL (ref 0–40)

## 2021-11-28 LAB — HEMOGLOBIN A1C
Hgb A1c MFr Bld: 5.8 % — ABNORMAL HIGH (ref 4.8–5.6)
Mean Plasma Glucose: 119.76 mg/dL

## 2021-11-28 NOTE — Progress Notes (Signed)
[facility] MD Progress Note  11/28/2021 12:29 PM Alexander Avery  MRN:  [id number] Subjective: Patient seen for follow-up.  66 year old man presented after overdose with suicidal intent.  Multiple symptoms of major depression.  Also polysubstance abuse currently undergoing opiate withdrawal.  Patient complains of still feeling achy.  Not vomiting.  Not overwhelming pain.  Able to eat okay.  Mood still down but denies any current suicidal ideation.  Denies hallucinations.  Says that he is hoping to get out of the hospital soon so that he can go to [place].  Vital signs stabilizing.  No new labs. Principal Problem: Major depressive disorder, recurrent severe without psychotic features ([place]) Diagnosis: Principal Problem:   Major depressive disorder, recurrent severe without psychotic features ([place]) Active Problems:   Polysubstance abuse ([place])   Opiate withdrawal ([place])  Total Time spent with patient: 30 minutes  Past Psychiatric History: Past history of depression previous substance abuse prior hospitalizations positive.  Multiple medical problems as well with chronic pain  Past Medical History:  Past Medical History:  Diagnosis Date   Anxiety    Arthritis    Cancer ([place])    prostate   Cataract 2019   Diabetes mellitus without complication (HCC)    GERD (gastroesophageal reflux disease)    Hernia of abdominal wall 2015   History of stomach ulcers    Hypertension    Prostate cancer ([place])    Seizures ([place])    Sleep apnea     Past Surgical History:  Procedure Laterality Date   CATARACT EXTRACTION W/PHACO Right 03/30/2018   Procedure: CATARACT EXTRACTION PHACO AND INTRAOCULAR LENS PLACEMENT ([place]);  Surgeon: Eulogio Bear, MD;  Location: [facility] ORS;  Service: Ophthalmology;  Laterality: Right;  [place] 01:09.5 AP% 10.4 CDE 7.98 Fluid pack lot # [id number]   CATARACT EXTRACTION W/PHACO Left 06/29/2018   Procedure: CATARACT EXTRACTION PHACO AND INTRAOCULAR LENS PLACEMENT (IOC);  Surgeon:  Eulogio Bear, MD;  Location: [facility] ORS;  Service: Ophthalmology;  Laterality: Left;  [place] 01:27.7 AP% 8.2 CDE 7.27 Fluid pack lot # [id number]   EYE SURGERY     HERNIA REPAIR     PROSTATE SURGERY     PROSTATE SURGERY  2016   Family History:  Family History  Problem Relation Age of Onset   Cancer Mother    Family Psychiatric  History: See previous Social History:  Social History   Substance and Sexual Activity  Alcohol Use Yes   Alcohol/week: 2.0 standard drinks   Types: 2 Cans of beer per week   Comment: 2 beers twice a week     Social History   Substance and Sexual Activity  Drug Use No    Social History   Socioeconomic History   Marital status: Single    Spouse name: Not on file   Number of children: Not on file   Years of education: Not on file   Highest education level: Not on file  Occupational History   Not on file  Tobacco Use   Smoking status: Some Days    Packs/day: 2.00    Types: Cigarettes   Smokeless tobacco: Never  Vaping Use   Vaping Use: Never used  Substance and Sexual Activity   Alcohol use: Yes    Alcohol/week: 2.0 standard drinks    Types: 2 Cans of beer per week    Comment: 2 beers twice a week   Drug use: No   Sexual activity: Not on file  Other Topics Concern  Not on file  Social History Narrative   Not on file   Social Determinants of Health   Financial Resource Strain: Not on file  Food Insecurity: Not on file  Transportation Needs: Not on file  Physical Activity: Not on file  Stress: Not on file  Social Connections: Not on file   Additional Social History:                         Sleep: Fair  Appetite:  Fair  Current Medications: Current Facility-Administered Medications  Medication Dose Route Frequency Provider Last Rate Last Admin   acetaminophen (TYLENOL) tablet 650 mg  650 mg Oral Q6H PRN Patrecia Pour, NP   650 mg at 11/27/21 0814   alum & mag hydroxide-simeth (MAALOX/MYLANTA) 200-200-20 MG/5ML  suspension 30 mL  30 mL Oral Q4H PRN Patrecia Pour, NP       atorvastatin (LIPITOR) tablet 40 mg  40 mg Oral QHS Patrecia Pour, NP   40 mg at 11/27/21 2126   cloNIDine (CATAPRES) tablet 0.1 mg  0.1 mg Oral Neena Rhymes, NP       Followed by   Derrill Memo ON 12/01/2021] cloNIDine (CATAPRES) tablet 0.1 mg  0.1 mg Oral QAC breakfast Patrecia Pour, NP       dicyclomine (BENTYL) tablet 20 mg  20 mg Oral Q6H PRN Patrecia Pour, NP       empagliflozin (JARDIANCE) tablet 10 mg  10 mg Oral Daily Patrecia Pour, NP   10 mg at 11/28/21 0902   feeding supplement (GLUCERNA SHAKE) (GLUCERNA SHAKE) liquid 237 mL  237 mL Oral TID BM Parks Ranger, DO   237 mL at 11/28/21 0902   losartan (COZAAR) tablet 100 mg  100 mg Oral Daily Patrecia Pour, NP   100 mg at 11/28/21 0901   And   hydrochlorothiazide (HYDRODIURIL) tablet 25 mg  25 mg Oral Daily Patrecia Pour, NP   25 mg at 11/28/21 0900   hydrOXYzine (ATARAX) tablet 25 mg  25 mg Oral Q6H PRN Patrecia Pour, NP   25 mg at 11/27/21 0814   levETIRAcetam (KEPPRA) tablet 500 mg  500 mg Oral BID Patrecia Pour, NP   500 mg at 11/28/21 1610   loperamide (IMODIUM) capsule 2-4 mg  2-4 mg Oral PRN Patrecia Pour, NP       LORazepam (ATIVAN) tablet 1 mg  1 mg Oral BID Parks Ranger, DO   1 mg at 11/28/21 0900   magnesium hydroxide (MILK OF MAGNESIA) suspension 30 mL  30 mL Oral Daily PRN Patrecia Pour, NP       metFORMIN (GLUCOPHAGE) tablet 500 mg  500 mg Oral BID WC Patrecia Pour, NP   500 mg at 11/28/21 0900   methocarbamol (ROBAXIN) tablet 500 mg  500 mg Oral Q8H PRN Patrecia Pour, NP       mirtazapine (REMERON) tablet 15 mg  15 mg Oral QHS Parks Ranger, DO   15 mg at 11/27/21 2127   multivitamin with minerals tablet 1 tablet  1 tablet Oral Daily Parks Ranger, DO   1 tablet at 11/28/21 0900   naproxen (NAPROSYN) tablet 500 mg  500 mg Oral BID PRN Patrecia Pour, NP   500 mg at 11/26/21 1715    ondansetron (ZOFRAN-ODT) disintegrating tablet 4 mg  4 mg Oral Q6H PRN Patrecia Pour, NP  4 mg at 11/26/21 1658   oxybutynin (DITROPAN-XL) 24 hr tablet 10 mg  10 mg Oral Daily Patrecia Pour, NP   10 mg at 11/28/21 0902   risperiDONE (RISPERDAL) tablet 0.5 mg  0.5 mg Oral [id number] Parks Ranger, DO   0.5 mg at 11/28/21 1610    Lab Results:  Results for orders placed or performed during the hospital encounter of 11/26/21 (from the past 48 hour(s))  Resp Panel by RT-PCR (Flu A&B, Covid) Nasopharyngeal Swab     Status: None   Collection Time: 11/26/21  2:55 PM   Specimen: Nasopharyngeal Swab; Nasopharyngeal(NP) swabs in vial transport medium  Result Value Ref Range   SARS Coronavirus 2 by RT PCR NEGATIVE NEGATIVE    Comment: (NOTE) SARS-CoV-2 target nucleic acids are NOT DETECTED.  The SARS-CoV-2 RNA is generally detectable in upper respiratory specimens during the acute phase of infection. The lowest concentration of SARS-CoV-2 viral copies this assay can detect is 138 copies/mL. A negative result does not preclude SARS-Cov-2 infection and should not be used as the sole basis for treatment or other patient management decisions. A negative result may occur with  improper specimen collection/handling, submission of specimen other than nasopharyngeal swab, presence of viral mutation(s) within the areas targeted by this assay, and inadequate number of viral copies(<138 copies/mL). A negative result must be combined with clinical observations, patient history, and epidemiological information. The expected result is Negative.  Fact Sheet for Patients:  EntrepreneurPulse.com.au  Fact Sheet for Healthcare Providers:  IncredibleEmployment.be  This test is no t yet approved or cleared by the [place] FDA and  has been authorized for detection and/or diagnosis of SARS-CoV-2 by FDA under an Emergency Use Authorization (EUA). This EUA will  remain  in effect (meaning this test can be used) for the duration of the COVID-19 declaration under Section 564(b)(1) of the Act, 21 U.S.C.section 360bbb-3(b)(1), unless the authorization is terminated  or revoked sooner.       Influenza A by PCR NEGATIVE NEGATIVE   Influenza B by PCR NEGATIVE NEGATIVE    Comment: (NOTE) The Xpert Xpress SARS-CoV-2/FLU/RSV plus assay is intended as an aid in the diagnosis of influenza from Nasopharyngeal swab specimens and should not be used as a sole basis for treatment. Nasal washings and aspirates are unacceptable for Xpert Xpress SARS-CoV-2/FLU/RSV testing.  Fact Sheet for Patients: EntrepreneurPulse.com.au  Fact Sheet for Healthcare Providers: IncredibleEmployment.be  This test is not yet approved or cleared by the [place] FDA and has been authorized for detection and/or diagnosis of SARS-CoV-2 by FDA under an Emergency Use Authorization (EUA). This EUA will remain in effect (meaning this test can be used) for the duration of the COVID-19 declaration under Section 564(b)(1) of the Act, 21 U.S.C. section 360bbb-3(b)(1), unless the authorization is terminated or revoked.  Performed at [facility], [address] [place], [place] [address]     Blood Alcohol level:  Lab Results  Component Value Date   [facility] <10 11/26/2021   ETH <10 40/98/1191    Metabolic Disorder Labs: No results found for: HGBA1C, MPG No results found for: PROLACTIN No results found for: CHOL, TRIG, HDL, CHOLHDL, VLDL, LDLCALC  Physical Findings: AIMS:  , ,  ,  ,    CIWA:    COWS:  COWS Total Score: 0  Musculoskeletal: Strength & Muscle Tone: within normal limits Gait & Station: normal Patient leans: N/A  Psychiatric Specialty Exam:  Presentation  General Appearance: Bizarre  Eye Contact:Minimal  Speech:Garbled  Speech Volume:Decreased  Handedness:Right   Mood and Affect   Mood:Irritable  Affect:Inappropriate; Congruent   Thought Process  Thought Processes:Coherent  Descriptions of Associations:Intact  Orientation:Full (Time, Place and Person)  Thought Content:Logical  History of Schizophrenia/Schizoaffective disorder:No  Duration of Psychotic Symptoms:No data recorded Hallucinations:No data recorded Ideas of Reference:None  Suicidal Thoughts:No data recorded Homicidal Thoughts:No data recorded  Sensorium  Memory:Immediate Good; Recent Good; Remote Good  Judgment:Fair  Insight:Fair   Executive Functions  Concentration:Fair  Attention Span:Fair  [place]   Psychomotor Activity  Psychomotor Activity:No data recorded  Assets  Assets:Communication Skills; Desire for Improvement; Physical Health; Resilience; Social Support   Sleep  Sleep:No data recorded   Physical Exam: Physical Exam Vitals and nursing note reviewed.  Constitutional:      Appearance: Normal appearance. He is ill-appearing.  HENT:     Head: Normocephalic and atraumatic.     Mouth/Throat:     Pharynx: Oropharynx is clear.  Eyes:     Pupils: Pupils are equal, round, and reactive to light.  Cardiovascular:     Rate and Rhythm: Normal rate and regular rhythm.  Pulmonary:     Effort: Pulmonary effort is normal.     Breath sounds: Normal breath sounds.  Abdominal:     General: Abdomen is flat.     Palpations: Abdomen is soft.  Musculoskeletal:        General: Normal range of motion.  Skin:    General: Skin is warm and dry.  Neurological:     General: No focal deficit present.     Mental Status: He is alert. Mental status is at baseline.  Psychiatric:        Attention and Perception: He is inattentive.        Mood and Affect: Mood is depressed. Affect is blunt.        Speech: Speech is delayed.        Behavior: Behavior is withdrawn.        Thought Content: Thought content normal. Thought content is not  paranoid. Thought content does not include homicidal or suicidal ideation.        Cognition and Memory: Cognition normal.   Review of Systems  Constitutional: Negative.   HENT: Negative.    Eyes: Negative.   Respiratory: Negative.    Cardiovascular: Negative.   Gastrointestinal: Negative.   Musculoskeletal:  Positive for myalgias.  Skin: Negative.   Neurological: Negative.   Psychiatric/Behavioral:  Positive for depression, substance abuse and suicidal ideas. Negative for hallucinations. The patient is nervous/anxious and has insomnia.   Blood pressure (!) 169/69, pulse (!) 54, temperature (!) 97 F (36.1 C), resp. rate 20, height 5\' 5"  (1.651 m), weight 49 kg, SpO2 100 %. Body mass index is 17.97 kg/m.   Treatment Plan Summary: Medication management and Plan patient has pain medication already ordered including Naprosyn and acetaminophen.  Encourage patient to ask for medication if he needs it.  Current orders in place for opiate withdrawal to be completed over a few days.  Current psychiatric medicines mirtazapine and risperidone being tolerated.  No change to any of those.  Because of Risperdal I am ordering a lipid panel and hemoglobin A1c.  Alethia Berthold, MD 11/28/2021, 12:29 PM

## 2021-11-28 NOTE — Group Note (Signed)
LCSW Group Therapy Note  Group Date: 11/28/2021 Start Time: 1300 End Time: 1400   Type of Therapy and Topic:  Group Therapy - Healthy vs Unhealthy Coping Skills  Participation Level:  Did Not Attend   Description of Group The focus of this group was to determine what unhealthy coping techniques typically are used by group members and what healthy coping techniques would be helpful in coping with various problems. Patients were guided in becoming aware of the differences between healthy and unhealthy coping techniques. Patients were asked to identify 2-3 healthy coping skills they would like to learn to use more effectively.  Therapeutic Goals Patients learned that coping is what human beings do all day long to deal with various situations in their lives Patients defined and discussed healthy vs unhealthy coping techniques Patients identified their preferred coping techniques and identified whether these were healthy or unhealthy Patients determined 2-3 healthy coping skills they would like to become more familiar with and use more often. Patients provided support and ideas to each other   Summary of Patient Progress:   X   Therapeutic Modalities Cognitive Behavioral Therapy Motivational Interviewing  Maryjane Hurter 11/28/2021  1:57 PM

## 2021-11-28 NOTE — BHH Suicide Risk Assessment (Addendum)
[facility] INPATIENT:  Family/Significant Other Suicide Prevention Education  Suicide Prevention Education:  Contact Attempts: Alexander Avery (uncle) [phone],  has been identified by the patient as the family member/significant other with whom the patient will be residing, and identified as the person(s) who will aid the patient in the event of a mental health crisis.  With written consent from the patient, two attempts were made to provide suicide prevention education, prior to and/or following the patient's discharge.  We were unsuccessful in providing suicide prevention education.  A suicide education pamphlet was given to the patient to share with family/significant other.  Date and time of first attempt: 11/28/2021 12:08PM Date and time of second attempt: A second attempt will be made at a later time   This CSW placed call to Alexander Avery. [name] answered and spoke with CSW briefly before the call failed. [name] states he was not aware of patient planning to move to [place], [place] and cannot currently house patient. [name] state's patient's cousin, Alexander Avery may be able to assist patient with housing. CSW placed return call after call failed, no answer. Left HIPAA compliant voicemail requesting return call.    Alexander Avery 11/28/2021, 12:12 PM

## 2021-11-28 NOTE — Progress Notes (Signed)
Patient appears as fatigued. Patient is A&O x4. Pt denies SI, HI, and AVH at this time. Pt denied pain. Pt is medication compliant. Pt remains safe on the unit at this time.
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
## 2021-11-29 NOTE — Progress Notes (Signed)
[facility] MD Progress Note  11/29/2021 2:51 PM Alexander Avery  MRN:  [id number] Subjective: Follow-up for this patient with opiate abuse and depression.  Nursing told me that he had been complaining of feeling bad and had asked for a "shot" earlier in the day.  Was not clear exactly in what way he was feeling sick.  By the time I saw him patient said he was feeling better.  Said his mood was feeling okay.  Denied depression or suicidal thought.  Still very focused with wanting to being discharged and says he has a place to go in [place].  Vitals stable.  Tolerating medicine well. Principal Problem: Major depressive disorder, recurrent severe without psychotic features ([place]) Diagnosis: Principal Problem:   Major depressive disorder, recurrent severe without psychotic features ([place]) Active Problems:   Polysubstance abuse ([place])   Opiate withdrawal ([place])  Total Time spent with patient: 30 minutes  Past Psychiatric History: Past history of opiate abuse and depression  Past Medical History:  Past Medical History:  Diagnosis Date   Anxiety    Arthritis    Cancer ([place])    prostate   Cataract 2019   Diabetes mellitus without complication ([place])    GERD (gastroesophageal reflux disease)    Hernia of abdominal wall 2015   History of stomach ulcers    Hypertension    Prostate cancer ([place])    Seizures ([place])    Sleep apnea     Past Surgical History:  Procedure Laterality Date   CATARACT EXTRACTION W/PHACO Right 03/30/2018   Procedure: CATARACT EXTRACTION PHACO AND INTRAOCULAR LENS PLACEMENT ([place]);  Surgeon: Eulogio Bear, MD;  Location: [facility] ORS;  Service: Ophthalmology;  Laterality: Right;  [place] 01:09.5 AP% 10.4 CDE 7.98 Fluid pack lot # [id number]   CATARACT EXTRACTION W/PHACO Left 06/29/2018   Procedure: CATARACT EXTRACTION PHACO AND INTRAOCULAR LENS PLACEMENT (IOC);  Surgeon: Eulogio Bear, MD;  Location: [facility] ORS;  Service: Ophthalmology;  Laterality: Left;  [place] 01:27.7 AP%  8.2 CDE 7.27 Fluid pack lot # [id number]   EYE SURGERY     HERNIA REPAIR     PROSTATE SURGERY     PROSTATE SURGERY  2016   Family History:  Family History  Problem Relation Age of Onset   Cancer Mother    Family Psychiatric  History: See previous Social History:  Social History   Substance and Sexual Activity  Alcohol Use Yes   Alcohol/week: 2.0 standard drinks   Types: 2 Cans of beer per week   Comment: 2 beers twice a week     Social History   Substance and Sexual Activity  Drug Use No    Social History   Socioeconomic History   Marital status: Single    Spouse name: Not on file   Number of children: Not on file   Years of education: Not on file   Highest education level: Not on file  Occupational History   Not on file  Tobacco Use   Smoking status: Some Days    Packs/day: 2.00    Types: Cigarettes   Smokeless tobacco: Never  Vaping Use   Vaping Use: Never used  Substance and Sexual Activity   Alcohol use: Yes    Alcohol/week: 2.0 standard drinks    Types: 2 Cans of beer per week    Comment: 2 beers twice a week   Drug use: No   Sexual activity: Not on file  Other Topics Concern   Not on file  Social History Narrative   Not on file   Social Determinants of Health   Financial Resource Strain: Not on file  Food Insecurity: Not on file  Transportation Needs: Not on file  Physical Activity: Not on file  Stress: Not on file  Social Connections: Not on file   Additional Social History:                         Sleep: Fair  Appetite:  Fair  Current Medications: Current Facility-Administered Medications  Medication Dose Route Frequency Provider Last Rate Last Admin   acetaminophen (TYLENOL) tablet 650 mg  650 mg Oral Q6H PRN Patrecia Pour, NP   650 mg at 11/29/21 0745   alum & mag hydroxide-simeth (MAALOX/MYLANTA) 200-200-20 MG/5ML suspension 30 mL  30 mL Oral Q4H PRN Patrecia Pour, NP       atorvastatin (LIPITOR) tablet 40 mg  40 mg  Oral QHS Patrecia Pour, NP   40 mg at 11/28/21 2102   cloNIDine (CATAPRES) tablet 0.1 mg  0.1 mg Oral Neena Rhymes, NP   0.1 mg at 11/29/21 5176   Followed by   Derrill Memo ON 12/01/2021] cloNIDine (CATAPRES) tablet 0.1 mg  0.1 mg Oral QAC breakfast Patrecia Pour, NP       dicyclomine (BENTYL) tablet 20 mg  20 mg Oral Q6H PRN Patrecia Pour, NP       empagliflozin (JARDIANCE) tablet 10 mg  10 mg Oral Daily Patrecia Pour, NP   10 mg at 11/29/21 0944   feeding supplement (GLUCERNA SHAKE) (GLUCERNA SHAKE) liquid 237 mL  237 mL Oral TID BM Parks Ranger, DO   237 mL at 11/29/21 0946   losartan (COZAAR) tablet 100 mg  100 mg Oral Daily Patrecia Pour, NP   100 mg at 11/29/21 1607   And   hydrochlorothiazide (HYDRODIURIL) tablet 25 mg  25 mg Oral Daily Patrecia Pour, NP   25 mg at 11/29/21 0943   hydrOXYzine (ATARAX) tablet 25 mg  25 mg Oral Q6H PRN Patrecia Pour, NP   25 mg at 11/29/21 0745   levETIRAcetam (KEPPRA) tablet 500 mg  500 mg Oral BID Patrecia Pour, NP   500 mg at 11/29/21 0944   loperamide (IMODIUM) capsule 2-4 mg  2-4 mg Oral PRN Patrecia Pour, NP       LORazepam (ATIVAN) tablet 1 mg  1 mg Oral BID Parks Ranger, DO   1 mg at 11/29/21 0944   magnesium hydroxide (MILK OF MAGNESIA) suspension 30 mL  30 mL Oral Daily PRN Patrecia Pour, NP       metFORMIN (GLUCOPHAGE) tablet 500 mg  500 mg Oral BID WC Patrecia Pour, NP   500 mg at 11/29/21 0736   methocarbamol (ROBAXIN) tablet 500 mg  500 mg Oral Q8H PRN Patrecia Pour, NP       mirtazapine (REMERON) tablet 15 mg  15 mg Oral QHS Parks Ranger, DO   15 mg at 11/28/21 2102   multivitamin with minerals tablet 1 tablet  1 tablet Oral Daily Parks Ranger, DO   1 tablet at 11/29/21 0943   naproxen (NAPROSYN) tablet 500 mg  500 mg Oral BID PRN Patrecia Pour, NP   500 mg at 11/26/21 1715   ondansetron (ZOFRAN-ODT) disintegrating tablet 4 mg  4 mg Oral Q6H PRN Patrecia Pour, NP   4  mg at 11/26/21 1658   oxybutynin (DITROPAN-XL) 24 hr tablet 10 mg  10 mg Oral Daily Patrecia Pour, NP   10 mg at 11/29/21 0944   risperiDONE (RISPERDAL) tablet 0.5 mg  0.5 mg Oral [id number] Parks Ranger, DO   0.5 mg at 11/29/21 6659    Lab Results:  Results for orders placed or performed during the hospital encounter of 11/26/21 (from the past 48 hour(s))  Hemoglobin A1c     Status: Abnormal   Collection Time: 11/28/21  2:00 PM  Result Value Ref Range   Hgb A1c MFr Bld 5.8 (H) 4.8 - 5.6 %    Comment: (NOTE) Pre diabetes:          5.7%-6.4%  Diabetes:              >6.4%  Glycemic control for   <7.0% adults with diabetes    Mean Plasma Glucose 119.76 mg/dL    Comment: Performed at [facility], [place] [address], [place], [place] [address]  Lipid panel     Status: None   Collection Time: 11/28/21  2:00 PM  Result Value Ref Range   Cholesterol 143 0 - 200 mg/dL   Triglycerides 80 <150 mg/dL   HDL 54 >40 mg/dL   Total CHOL/HDL Ratio 2.6 RATIO   VLDL 16 0 - 40 mg/dL   LDL Cholesterol 73 0 - 99 mg/dL    Comment:        Total Cholesterol/HDL:CHD Risk Coronary Heart Disease Risk Table                     Men   Women  1/2 Average Risk   3.4   3.3  Average Risk       5.0   4.4  2 X Average Risk   9.6   7.1  3 X Average Risk  23.4   11.0        Use the calculated Patient Ratio above and the CHD Risk Table to determine the patient's CHD Risk.        ATP III CLASSIFICATION (LDL):  <100     mg/dL   Optimal  100-129  mg/dL   Near or Above                    Optimal  130-159  mg/dL   Borderline  160-189  mg/dL   High  >190     mg/dL   Very High Performed at [facility], [address] [place], [place] [address]     Blood Alcohol level:  Lab Results  Component Value Date   [facility] <10 11/26/2021   ETH <10 90/30/0923    Metabolic Disorder Labs: Lab Results  Component Value Date   HGBA1C 5.8 (H) 11/28/2021   MPG 119.76 11/28/2021   No results  found for: PROLACTIN Lab Results  Component Value Date   CHOL 143 11/28/2021   TRIG 80 11/28/2021   HDL 54 11/28/2021   CHOLHDL 2.6 11/28/2021   VLDL 16 11/28/2021   LDLCALC 73 11/28/2021    Physical Findings: AIMS:  , ,  ,  ,    CIWA:    COWS:  COWS Total Score: 0  Musculoskeletal: Strength & Muscle Tone: within normal limits Gait & Station: normal Patient leans: N/A  Psychiatric Specialty Exam:  Presentation  General Appearance: Bizarre  Eye Contact:Minimal  Speech:Garbled  Speech Volume:Decreased  Handedness:Right   Mood and Affect  Mood:Irritable  Affect:Inappropriate; Congruent   Thought Process  Thought Processes:Coherent  Descriptions of Associations:Intact  Orientation:Full (Time, Place and Person)  Thought Content:Logical  History of Schizophrenia/Schizoaffective disorder:No  Duration of Psychotic Symptoms:No data recorded Hallucinations:No data recorded Ideas of Reference:None  Suicidal Thoughts:No data recorded Homicidal Thoughts:No data recorded  Sensorium  Memory:Immediate Good; Recent Good; Remote Good  Judgment:Fair  Insight:Fair   Executive Functions  Concentration:Fair  Attention Span:Fair  [place]   Psychomotor Activity  Psychomotor Activity:No data recorded  Assets  Assets:Communication Skills; Desire for Improvement; Physical Health; Resilience; Social Support   Sleep  Sleep:No data recorded   Physical Exam: Physical Exam Vitals and nursing note reviewed.  Constitutional:      Appearance: Normal appearance.  HENT:     Head: Normocephalic and atraumatic.     Mouth/Throat:     Pharynx: Oropharynx is clear.  Eyes:     Pupils: Pupils are equal, round, and reactive to light.  Cardiovascular:     Rate and Rhythm: Normal rate and regular rhythm.  Pulmonary:     Effort: Pulmonary effort is normal.     Breath sounds: Normal breath sounds.  Abdominal:      General: Abdomen is flat.     Palpations: Abdomen is soft.  Musculoskeletal:        General: Normal range of motion.  Skin:    General: Skin is warm and dry.  Neurological:     General: No focal deficit present.     Mental Status: He is alert. Mental status is at baseline.  Psychiatric:        Attention and Perception: Attention normal.        Mood and Affect: Mood normal. Affect is blunt.        Speech: Speech normal.        Behavior: Behavior is cooperative.        Thought Content: Thought content normal.        Cognition and Memory: Memory is impaired.   Review of Systems  Constitutional: Negative.   HENT: Negative.    Eyes: Negative.   Respiratory: Negative.    Cardiovascular: Negative.   Gastrointestinal: Negative.   Musculoskeletal: Negative.   Skin: Negative.   Neurological: Negative.   Psychiatric/Behavioral: Negative.    Blood pressure (!) 148/78, pulse (!) 57, temperature 98.2 F (36.8 C), temperature source Oral, resp. rate 18, height 5\' 5"  (1.651 m), weight 49 kg, SpO2 100 %. Body mass index is 17.97 kg/m.   Treatment Plan Summary: Plan supportive counseling and therapy.  Encouraged patient to talk with social work if he really wants to make some plans for discharge or figure out a way to get to [place].  No change to medicine.  Alethia Berthold, MD 11/29/2021, 2:51 PM

## 2021-11-29 NOTE — BHH Suicide Risk Assessment (Signed)
[facility] INPATIENT:  Family/Significant Other Suicide Prevention Education  Suicide Prevention Education:  Contact Attempts: Shellee Milo (uncle) [phone] been identified by the patient as the family member/significant other with whom the patient will be residing, and identified as the person(s) who will aid the patient in the event of a mental health crisis.  With written consent from the patient, two attempts were made to provide suicide prevention education, prior to and/or following the patient's discharge.  We were unsuccessful in providing suicide prevention education.  A suicide education pamphlet was given to the patient to share with family/significant other.  Date and time of first attempt:11/28/2021 12:08PM Date and time of second attempt: 11/29/2021 9:20AM  CSW placed call to Mr. Green, no answer. Left HIPAA compliant voicemail requesting return call.   Kenna Gilbert Delawrence Fridman 11/29/2021, 9:20 AM

## 2021-11-29 NOTE — Progress Notes (Signed)
Patient is alert and oriented x4. Appeared weak and frail. Patient verbalized anxiety of 9/10, depression 5/10. Denies SI, HI, AVH. Patient approaches the nursing station requesting for a shot. Patient report not feeling . Compliant with all due medications. Ate breakfast in the day room among peers with good appetite. Ambulate back to room in no apparent distress. Remain safe on the unit at this time with Q15 minute safety check.

## 2021-11-29 NOTE — Progress Notes (Signed)
Patient is seen in his room, he said that he is tired. Pt came out for snack. Pt voiced no complaints to this Probation officer. He denies SI, HI, and AVH at this time. Pt remains safe on the unit at this time.

## 2021-11-29 NOTE — Group Note (Signed)
LCSW Group Therapy Note  Group Date: 11/29/2021 Start Time: 3361 End Time: 1400   Type of Therapy and Topic:  Group Therapy - How To Cope with Nervousness about Discharge   Participation Level:  Minimal   Description of Group This process group involved identification of patients' feelings about discharge. Some of them are scheduled to be discharged soon, while others are new admissions, but each of them was asked to share thoughts and feelings surrounding discharge from the hospital. One common theme was that they are excited at the prospect of going home, while another was that many of them are apprehensive about sharing why they were hospitalized. Patients were given the opportunity to discuss these feelings with their peers in preparation for discharge.  Therapeutic Goals  Patient will identify their overall feelings about pending discharge. Patient will think about how they might proactively address issues that they believe will once again arise once they get home (i.e. with parents). Patients will participate in discussion about having hope for change.   Summary of Patient Progress: Patient presented late to group due to meeting with the doctor. Patient shared that he is feeling anxious to get out of the hospital and wants to move to [place] where his family resides. Patient left group early.  Therapeutic Modalities Cognitive Behavioral Therapy   Berniece Salines, [place] 11/29/2021  2:08 PM

## 2021-11-30 NOTE — BHH Counselor (Signed)
CSW discussed follow up care with pt. Pt stated that he did not want CSW to schedule or make appointment and said he would find substance use treatment outpatient or inpatient on his own when he arrives on [place]. Pt declined follow up care with CSW.   Orbin Mayeux [place], MSW, LCSW-A 1/16/202311:32 AM

## 2021-11-30 NOTE — BHH Suicide Risk Assessment (Signed)
[facility] Discharge Suicide Risk Assessment   Principal Problem: Major depressive disorder, recurrent severe without psychotic features [facility]) Discharge Diagnoses: Principal Problem:   Major depressive disorder, recurrent severe without psychotic features ([place]) Active Problems:   Polysubstance abuse ([place])   Opiate withdrawal ([place])   Total Time spent with patient: 1 hour  Musculoskeletal: Strength & Muscle Tone: within normal limits Gait & Station: normal Patient leans: N/A  Psychiatric Specialty Exam  Presentation  General Appearance: Bizarre  Eye Contact:Minimal  Speech:Garbled  Speech Volume:Decreased  Handedness:Right   Mood and Affect  Mood:Irritable  Duration of Depression Symptoms: Greater than two weeks  Affect:Inappropriate; Congruent   Thought Process  Thought Processes:Coherent  Descriptions of Associations:Intact  Orientation:Full (Time, Place and Person)  Thought Content:Logical  History of Schizophrenia/Schizoaffective disorder:No  Duration of Psychotic Symptoms:No data recorded Hallucinations:No data recorded Ideas of Reference:None  Suicidal Thoughts:No data recorded Homicidal Thoughts:No data recorded  Sensorium  Memory:Immediate Good; Recent Good; Remote Good  Judgment:Fair  Insight:Fair   Executive Functions  Concentration:Fair  Attention Span:Fair  [place]   Psychomotor Activity  Psychomotor Activity:No data recorded  Assets  Assets:Communication Skills; Desire for Improvement; Physical Health; Resilience; Social Support   Sleep  Sleep:No data recorded  Physical Exam: Physical Exam Vitals and nursing note reviewed.  Constitutional:      Appearance: Normal appearance. He is normal weight.  Neurological:     General: No focal deficit present.     Mental Status: He is alert and oriented to person, place, and time.  Psychiatric:        Attention and Perception: Attention  and perception normal.        Mood and Affect: Mood and affect normal.        Speech: Speech normal.        Behavior: Behavior normal. Behavior is cooperative.        Thought Content: Thought content normal.        Cognition and Memory: Cognition and memory normal.        Judgment: Judgment normal.   Review of Systems  Constitutional: Negative.   HENT: Negative.    Eyes: Negative.   Respiratory: Negative.    Cardiovascular: Negative.   Gastrointestinal: Negative.   Genitourinary: Negative.   Musculoskeletal: Negative.   Skin: Negative.   Neurological: Negative.   Endo/Heme/Allergies: Negative.   Psychiatric/Behavioral: Negative.    Blood pressure (!) 181/68, pulse (!) 59, temperature 98 F (36.7 C), temperature source Oral, resp. rate 18, height 5\' 5"  (1.651 m), weight 49 kg, SpO2 99 %. Body mass index is 17.97 kg/m.  Mental Status Per Nursing Assessment::   On Admission:  NA  Demographic Factors:  Male, Age 37 or older, and Low socioeconomic status  Loss Factors: NA  Historical Factors: Impulsivity  Risk Reduction Factors:   Positive coping skills or problem solving skills  Continued Clinical Symptoms:  Alcohol/Substance Abuse/Dependencies  Cognitive Features That Contribute To Risk:  Polarized thinking    Suicide Risk:  Minimal: No identifiable suicidal ideation.  Patients presenting with no risk factors but with morbid ruminations; may be classified as minimal risk based on the severity of the depressive symptoms    Plan Of Care/Follow-up: D/C [place], DO 11/30/2021, 10:47 AM

## 2021-11-30 NOTE — Plan of Care (Signed)
Problem: Group Participation Goal: STG - Patient will engage in groups without prompting or encouragement from LRT x3 group sessions within 5 recreation therapy group sessions Description: STG - Patient will engage in groups without prompting or encouragement from LRT x3 group sessions within 5 recreation therapy group sessions 11/30/2021 1456 by Ernest Haber, LRT Outcome: Not Applicable 8/40/6986 1483 by Ernest Haber, LRT Outcome: Not Met (add Reason) Note: Patient did not attend any groups.

## 2021-11-30 NOTE — Progress Notes (Signed)
°  [facility] Adult Case Management Discharge Plan :  Will you be returning to the same living situation after discharge:  No. At discharge, do you have transportation home?: Yes,  pt's uncle Do you have the ability to pay for your medications: Yes,  pt has [facility] insurance  Release of information consent forms completed and in the chart;  Patient's signature needed at discharge.  Patient to Follow up at:  Follow-up Information     [facility] Follow up.   Why: Though you have declined follow up services, here is a resource in network with your insurance that provides substance use treatment and behavioral health services. Please call to schedule an appointment as needed. Thanks! Contact information: [address] [place], [place] [address] Phone [phone] Fax [phone]        [facility] Follow up.   Why: Though you have declined follow up services, here is a resource in network with your insurance that provides substance use treatment and behavioral health services. Please call to schedule an appointment as needed. Thanks! Contact information: [place], [place] [address] Phone: [phone]                Next level of care provider has access to [place] and Suicide Prevention discussed: Yes,  completed with pt     Has patient been referred to the Quitline?: Patient refused referral  Patient has been referred for addiction treatment: Pt. refused referral  Alexander Avery, [place] 11/30/2021, 11:50 AM

## 2021-11-30 NOTE — Progress Notes (Signed)
Recreation Therapy Notes  INPATIENT RECREATION TR PLAN  Patient Details Name: BJORN HALLAS MRN: [id number] DOB: 22-Jul-1956 Today's Date: 11/30/2021  Rec Therapy Plan Is patient appropriate for Therapeutic Recreation?: Yes Treatment times per week: at least 3 Estimated Length of Stay: 5-7 days TR Treatment/Interventions: Group participation (Comment)  Discharge Criteria Pt will be discharged from therapy if:: Discharged Treatment plan/goals/alternatives discussed and agreed upon by:: Patient/family  Discharge Summary Short term goals set: Patient will engage in groups without prompting or encouragement from LRT x3 group sessions within 5 recreation therapy group sessions Short term goals met: Not met Reason goals not met: Patient did not attend any groups. Therapeutic equipment acquired: N/A Reason patient discharged from therapy: Discharge from hospital Pt/family agrees with progress & goals achieved: Yes Date patient discharged from therapy: 11/30/21   Wilma Michaelson 11/30/2021, 2:58 PM

## 2021-11-30 NOTE — Discharge Summary (Signed)
Physician Discharge Summary Note  Patient:  Alexander Avery is an 66 y.o., male MRN:  [id number] DOB:  [birth year] Patient phone:  [phone] (home)  Patient address:   [address] [place] [address],  Total Time spent with patient: 1 hour  Date of Admission:  11/26/2021 Date of Discharge: 11/30/2021  Reason for Admission:  66 yo male presents to the ED for assistance for depression, reports he overdosed yesterday on 5-6 pills (blood pressure one of these).  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  He is self-medication with heroin, 3-4 bags daily, and cannabis.  Toxicology screen also positive for cocaine.  No current detox symptoms besides fatigue.  No homicidal ideations, hallucinations, or paranoia.  Admitted to [place] [place] in December for similar symptoms.  Recommended for geriatric psych.   Principal Problem: Major depressive disorder, recurrent severe without psychotic features [facility]) Discharge Diagnoses: Principal Problem:   Major depressive disorder, recurrent severe without psychotic features ([place]) Active Problems:   Polysubstance abuse ([place])   Opiate withdrawal ([place])   Past Psychiatric History: History of rehab., in-patient psychiatric treatment but no out-patient  Past Medical History:  Past Medical History:  Diagnosis Date   Anxiety    Arthritis    Cancer ([place])    prostate   Cataract 2019   Diabetes mellitus without complication ([place])    GERD (gastroesophageal reflux disease)    Hernia of abdominal wall 2015   History of stomach ulcers    Hypertension    Prostate cancer ([place])    Seizures ([place])    Sleep apnea     Past Surgical History:  Procedure Laterality Date   CATARACT EXTRACTION W/PHACO Right 03/30/2018   Procedure: CATARACT EXTRACTION PHACO AND INTRAOCULAR LENS PLACEMENT ([place]);  Surgeon: Alexander Bear, MD;  Location: [facility] ORS;  Service: Ophthalmology;  Laterality: Right;  [place] 01:09.5 AP% 10.4 CDE  7.98 Fluid pack lot # [id number]   CATARACT EXTRACTION W/PHACO Left 06/29/2018   Procedure: CATARACT EXTRACTION PHACO AND INTRAOCULAR LENS PLACEMENT ([facility]);  Surgeon: Alexander Bear, MD;  Location: [facility] ORS;  Service: Ophthalmology;  Laterality: Left;  [place] 01:27.7 AP% 8.2 CDE 7.27 Fluid pack lot # [id number]   EYE SURGERY     HERNIA REPAIR     PROSTATE SURGERY     PROSTATE SURGERY  2016   Family History:  Family History  Problem Relation Age of Onset   Cancer Mother    Family Psychiatric  History: Unknown Social History:  Social History   Substance and Sexual Activity  Alcohol Use Yes   Alcohol/week: 2.0 standard drinks   Types: 2 Cans of beer per week   Comment: 2 beers twice a week     Social History   Substance and Sexual Activity  Drug Use No    Social History   Socioeconomic History   Marital status: Single    Spouse name: Not on file   Number of children: Not on file   Years of education: Not on file   Highest education level: Not on file  Occupational History   Not on file  Tobacco Use   Smoking status: Some Days    Packs/day: 2.00    Types: Cigarettes   Smokeless tobacco: Never  Vaping Use   Vaping Use: Never used  Substance and Sexual Activity   Alcohol use: Yes    Alcohol/week: 2.0 standard drinks    Types: 2 Cans of beer per week  Comment: 2 beers twice a week   Drug use: No   Sexual activity: Not on file  Other Topics Concern   Not on file  Social History Narrative   Not on file   Social Determinants of Health   Financial Resource Strain: Not on file  Food Insecurity: Not on file  Transportation Needs: Not on file  Physical Activity: Not on file  Stress: Not on file  Social Connections: Not on file    Hospital Course: [name] was admitted involuntarily to the geriatric psychiatry unit.  He immediately signed a voluntary form.  Initially, he stated he wanted to go to drug rehab upon discharge.  He was started on CIWA and Ativan.   His blood pressure medications were restarted and Remeron and Risperdal were initiated.  He stated that he felt better and wanted to be discharged.  He states that his uncle was going to pick him up and he was no longer interested in drug rehab.  I informed him that because it was such a short notice it would be [place] his risk of substance abuse.  He stated he did not want any medications on discharge.  He was pleasant and cooperative while on the unit.  On the day of discharge she denied suicidal ideation or homicidal ideation or auditory or visual hallucinations.  His judgment and insight were poor.  Physical Findings: AIMS:  , ,  ,  ,    CIWA:    COWS:  COWS Total Score: 0  Musculoskeletal: Strength & Muscle Tone: within normal limits Gait & Station: normal Patient leans: N/A   Psychiatric Specialty Exam:  Presentation  General Appearance: Bizarre  Eye Contact:Minimal  Speech:Garbled  Speech Volume:Decreased  Handedness:Right   Mood and Affect  Mood:Irritable  Affect:Inappropriate; Congruent   Thought Process  Thought Processes:Coherent  Descriptions of Associations:Intact  Orientation:Full (Time, Place and Person)  Thought Content:Logical  History of Schizophrenia/Schizoaffective disorder:No  Duration of Psychotic Symptoms:No data recorded Hallucinations:No data recorded Ideas of Reference:None  Suicidal Thoughts:No data recorded Homicidal Thoughts:No data recorded  Sensorium  Memory:Immediate Good; Recent Good; Remote Good  Judgment:Fair  Insight:Fair   Executive Functions  Concentration:Fair  Attention Span:Fair  [place]   Psychomotor Activity  Psychomotor Activity:No data recorded  Assets  Assets:Communication Skills; Desire for Improvement; Physical Health; Resilience; Social Support   Sleep  Sleep:No data recorded   Physical Exam: Physical Exam Vitals and nursing  note reviewed.  Constitutional:      Appearance: Normal appearance. He is normal weight.  Neurological:     General: No focal deficit present.     Mental Status: He is alert and oriented to person, place, and time.  Psychiatric:        Attention and Perception: Attention and perception normal.        Mood and Affect: Mood normal.        Speech: Speech normal.        Behavior: Behavior is uncooperative.        Thought Content: Thought content normal.        Cognition and Memory: Cognition and memory normal.        Judgment: Judgment normal.   Review of Systems  Constitutional: Negative.   HENT: Negative.    Eyes: Negative.   Respiratory: Negative.    Cardiovascular: Negative.   Gastrointestinal: Negative.   Genitourinary: Negative.   Musculoskeletal: Negative.   Skin: Negative.   Neurological: Negative.  Endo/Heme/Allergies: Negative.   Psychiatric/Behavioral: Negative.    Blood pressure (!) 181/68, pulse (!) 59, temperature 98 F (36.7 C), temperature source Oral, resp. rate 18, height 5\' 5"  (1.651 m), weight 49 kg, SpO2 99 %. Body mass index is 17.97 kg/m.   Social History   Tobacco Use  Smoking Status Some Days   Packs/day: 2.00   Types: Cigarettes  Smokeless Tobacco Never   Tobacco Cessation:  A prescription for an FDA-approved tobacco cessation medication was offered at discharge and the patient refused   Blood Alcohol level:  Lab Results  Component Value Date   [facility] <10 11/26/2021   ETH <10 34/19/3790    Metabolic Disorder Labs:  Lab Results  Component Value Date   HGBA1C 5.8 (H) 11/28/2021   MPG 119.76 11/28/2021   No results found for: PROLACTIN Lab Results  Component Value Date   CHOL 143 11/28/2021   TRIG 80 11/28/2021   HDL 54 11/28/2021   CHOLHDL 2.6 11/28/2021   VLDL 16 11/28/2021   [place] 73 11/28/2021    See Psychiatric Specialty Exam and Suicide Risk Assessment completed by Attending Physician prior to discharge.  Discharge  destination:  Home  Is patient on multiple antipsychotic therapies at discharge:  No   Has Patient had three or more failed trials of antipsychotic monotherapy by history:  No  Recommended Plan for Multiple Antipsychotic Therapies: NA   Allergies as of 11/30/2021       Reactions   Penicillins Hives, Other (See Comments)   Urinate on self, pass out Has patient had a PCN reaction causing immediate rash, facial/tongue/throat swelling, SOB or lightheadedness with hypotension: Yes Has patient had a PCN reaction causing severe rash involving mucus membranes or skin necrosis: No Has patient had a PCN reaction that required hospitalization: Yes- In hospital Has patient had a PCN reaction occurring within the last 10 years: Yes If all of the above answers are "NO", then may proceed with Cephalosporin use. Other reaction(s): Other (See Comments) Urinate on self, pass out Has patient had a PCN reaction causing immediate rash, facial/tongue/throat swelling, SOB or lightheadedness with hypotension: Yes Has patient had a PCN reaction causing severe rash involving mucus membranes or skin necrosis: No Has patient had a PCN reaction that required hospitalization: Yes- In hospital Has patient had a PCN reaction occurring within the last 10 years: Yes If all of the above answers are "NO", then may proceed with Cephalosporin use. "I pass out"        Medication List     STOP taking these medications    acetaminophen 325 MG tablet Commonly known as: TYLENOL       TAKE these medications      Indication  amLODipine 5 MG tablet Commonly known as: NORVASC Take 5 mg by mouth daily.    atorvastatin 40 MG tablet Commonly known as: LIPITOR Take 40 mg by mouth at bedtime.    canagliflozin 300 MG Tabs tablet Commonly known as: INVOKANA Take 300 mg by mouth daily before breakfast.    cloNIDine 0.1 MG tablet Commonly known as: CATAPRES Take 0.1 mg by mouth 2 (two) times daily as needed.     Difluprednate 0.05 % Emul Place 1 drop into the right eye 2 (two) times daily.    hydrocortisone 2.5 % cream Apply topically 2 (two) times daily.    hydrocortisone 25 MG suppository Commonly known as: ANUSOL-HC Place 1 suppository (25 mg total) rectally every 12 (twelve) hours.    Jardiance 10  MG Tabs tablet Generic drug: empagliflozin TAKE 1 TABLET BY MOUTH IN THE MORNING FOR DIABETES    levETIRAcetam 500 MG tablet Commonly known as: Keppra Take 1 tablet (500 mg total) by mouth 2 (two) times daily.    losartan 100 MG tablet Commonly known as: COZAAR Take 100 mg by mouth daily.    losartan-hydrochlorothiazide 100-25 MG tablet Commonly known as: HYZAAR Take 1 tablet by mouth daily.    meclizine 12.5 MG tablet Commonly known as: ANTIVERT Take 1 tablet (12.5 mg total) by mouth 2 (two) times daily as needed for dizziness.    metFORMIN 500 MG tablet Commonly known as: GLUCOPHAGE Take 500 mg by mouth 2 (two) times daily with a meal.    oxybutynin 10 MG 24 hr tablet Commonly known as: DITROPAN-XL Take 1 tablet (10 mg total) by mouth daily.    QUEtiapine 100 MG tablet Commonly known as: SEROQUEL Take 100 mg by mouth at bedtime.    sildenafil 100 MG tablet Commonly known as: VIAGRA Take 1 tablet (100 mg total) by mouth daily as needed for erectile dysfunction. Take two hours prior to intercourse on an empty stomach    sildenafil 20 MG tablet Commonly known as: REVATIO Take 3 to 5 tablets two hours before intercouse on an empty stomach.  Do not take with nitrates.          Follow-up recommendations: Per Patient   Comments:  AMA  Signed: Parks Ranger, DO 11/30/2021, 10:52 AM

## 2021-11-30 NOTE — Progress Notes (Signed)
Patient alert and oriented x 4.  Affect is restless and anxious. Denies anxiety, SI/HI or AVH.  Patient states they will try to keep themselves safe when they return home. Stated he is leaving [place] and going to [place] discharge instructions with patient including who to call for follow up appointments. Patient stated he had no questions at this time and that his uncle was down stairs waiting for him. Discharge packet given. All belongings returned to patient after verification completed by staff.    Patient escorted by staff off unit at this time for [facility] discharge without complaint.

## 2021-11-30 NOTE — Progress Notes (Signed)
Patient is alert and oriented times 4. Mood and affect appropriate. Patient denies pain. He denies SI, HI, and AVH. Also denies feelings of anxiety and depression at this time. States he slept good last night. Morning meds given whole by mouth W/O difficulty. Ate breakfast in day room- appetite good. Patient remains on unit with Q15 minute checks in place.  

## 2022-04-17 DIAGNOSIS — E119 Type 2 diabetes mellitus without complications: Secondary | ICD-10-CM | POA: Insufficient documentation

## 2022-04-17 DIAGNOSIS — R45851 Suicidal ideations: Secondary | ICD-10-CM | POA: Insufficient documentation

## 2022-04-17 DIAGNOSIS — I10 Essential (primary) hypertension: Secondary | ICD-10-CM | POA: Diagnosis not present

## 2022-04-17 DIAGNOSIS — F332 Major depressive disorder, recurrent severe without psychotic features: Secondary | ICD-10-CM | POA: Insufficient documentation

## 2022-04-17 DIAGNOSIS — F32A Depression, unspecified: Secondary | ICD-10-CM | POA: Diagnosis present

## 2022-04-17 DIAGNOSIS — G8929 Other chronic pain: Secondary | ICD-10-CM | POA: Insufficient documentation

## 2022-04-17 DIAGNOSIS — F14188 Cocaine abuse with other cocaine-induced disorder: Secondary | ICD-10-CM | POA: Insufficient documentation

## 2022-04-17 DIAGNOSIS — F17210 Nicotine dependence, cigarettes, uncomplicated: Secondary | ICD-10-CM | POA: Diagnosis not present

## 2022-04-17 DIAGNOSIS — F1210 Cannabis abuse, uncomplicated: Secondary | ICD-10-CM | POA: Diagnosis not present

## 2022-04-17 DIAGNOSIS — Z8546 Personal history of malignant neoplasm of prostate: Secondary | ICD-10-CM | POA: Diagnosis not present

## 2022-04-17 DIAGNOSIS — F1414 Cocaine abuse with cocaine-induced mood disorder: Secondary | ICD-10-CM | POA: Diagnosis not present

## 2022-04-17 DIAGNOSIS — F1910 Other psychoactive substance abuse, uncomplicated: Secondary | ICD-10-CM | POA: Diagnosis not present

## 2022-04-17 DIAGNOSIS — C61 Malignant neoplasm of prostate: Secondary | ICD-10-CM | POA: Diagnosis not present

## 2022-04-17 NOTE — Consult Note (Incomplete)
[facility] Face-to-Face Psychiatry Consult   Reason for Consult:  *** Referring Physician:  *** Patient Identification: Alexander Avery MRN:  [id number] Principal Diagnosis: <principal problem not specified> Diagnosis:  Active Problems:   * No active hospital problems. *   Total Time spent with patient: {Time; 15 min - 8 hours:17441}  Subjective:   Alexander Avery is a 66 y.o. male patient admitted with ***.  HPI:  ***  Past Psychiatric History: ***  Risk to Self:   Risk to Others:   Prior Inpatient Therapy:   Prior Outpatient Therapy:    Past Medical History:  Past Medical History:  Diagnosis Date  . Anxiety   . Arthritis   . Cancer [facility])    prostate  . Cataract 2019  . Diabetes mellitus without complication ([place])   . GERD (gastroesophageal reflux disease)   . Hernia of abdominal wall 2015  . History of stomach ulcers   . Hypertension   . Prostate cancer ([place])   . Seizures ([place])   . Sleep apnea     Past Surgical History:  Procedure Laterality Date  . CATARACT EXTRACTION W/PHACO Right 03/30/2018   Procedure: CATARACT EXTRACTION PHACO AND INTRAOCULAR LENS PLACEMENT ([facility]);  Surgeon: Eulogio Bear, MD;  Location: [facility] ORS;  Service: Ophthalmology;  Laterality: Right;  [place] 01:09.5 AP% 10.4 CDE 7.98 Fluid pack lot # [id number]  . CATARACT EXTRACTION W/PHACO Left 06/29/2018   Procedure: CATARACT EXTRACTION PHACO AND INTRAOCULAR LENS PLACEMENT ([facility]);  Surgeon: Eulogio Bear, MD;  Location: [facility] ORS;  Service: Ophthalmology;  Laterality: Left;  [place] 01:27.7 AP% 8.2 CDE 7.27 Fluid pack lot # [id number]  . EYE SURGERY    . HERNIA REPAIR    . PROSTATE SURGERY    . PROSTATE SURGERY  2016   Family History:  Family History  Problem Relation Age of Onset  . Cancer Mother    Family Psychiatric  History: *** Social History:  Social History   Substance and Sexual Activity  Alcohol Use Yes  . Alcohol/week: 2.0 standard drinks  . Types: 2 Cans of beer per week    Comment: 2 beers twice a week     Social History   Substance and Sexual Activity  Drug Use No    Social History   Socioeconomic History  . Marital status: Single    Spouse name: Not on file  . Number of children: Not on file  . Years of education: Not on file  . Highest education level: Not on file  Occupational History  . Not on file  Tobacco Use  . Smoking status: Some Days    Packs/day: 2.00    Types: Cigarettes  . Smokeless tobacco: Never  Vaping Use  . Vaping Use: Never used  Substance and Sexual Activity  . Alcohol use: Yes    Alcohol/week: 2.0 standard drinks    Types: 2 Cans of beer per week    Comment: 2 beers twice a week  . Drug use: No  . Sexual activity: Not on file  Other Topics Concern  . Not on file  Social History Narrative  . Not on file   Social Determinants of Health   Financial Resource Strain: Not on file  Food Insecurity: Not on file  Transportation Needs: Not on file  Physical Activity: Not on file  Stress: Not on file  Social Connections: Not on file   Additional Social History:    Allergies:   Allergies  Allergen Reactions  . Penicillins  Hives and Other (See Comments)    Urinate on self, pass out Has patient had a PCN reaction causing immediate rash, facial/tongue/throat swelling, SOB or lightheadedness with hypotension: Yes Has patient had a PCN reaction causing severe rash involving mucus membranes or skin necrosis: No Has patient had a PCN reaction that required hospitalization: Yes- In hospital Has patient had a PCN reaction occurring within the last 10 years: Yes If all of the above answers are "NO", then may proceed with Cephalosporin use.  Other reaction(s): Other (See Comments) Urinate on self, pass out Has patient had a PCN reaction causing immediate rash, facial/tongue/throat swelling, SOB or lightheadedness with hypotension: Yes Has patient had a PCN reaction causing severe rash involving mucus membranes or skin  necrosis: No Has patient had a PCN reaction that required hospitalization: Yes- In hospital Has patient had a PCN reaction occurring within the last 10 years: Yes If all of the above answers are "NO", then may proceed with Cephalosporin use. "I pass out"     Labs: No results found for this or any previous visit (from the past 48 hour(s)).  No current facility-administered medications for this encounter.   Current Outpatient Medications  Medication Sig Dispense Refill  . amLODipine (NORVASC) 5 MG tablet Take 5 mg by mouth daily.    Marland Kitchen atorvastatin (LIPITOR) 40 MG tablet Take 40 mg by mouth at bedtime. (Patient not taking: Reported on 11/26/2021)  4  . canagliflozin (INVOKANA) 300 MG TABS tablet Take 300 mg by mouth daily before breakfast. (Patient not taking: Reported on 11/26/2021)    . cloNIDine (CATAPRES) 0.1 MG tablet Take 0.1 mg by mouth 2 (two) times daily as needed.    . Difluprednate 0.05 % EMUL Place 1 drop into the right eye 2 (two) times daily.  (Patient not taking: Reported on 11/26/2021)    . hydrocortisone (ANUSOL-HC) 25 MG suppository Place 1 suppository (25 mg total) rectally every 12 (twelve) hours. 12 suppository 1  . hydrocortisone 2.5 % cream Apply topically 2 (two) times daily. (Patient not taking: Reported on 11/26/2021) 30 g 1  . JARDIANCE 10 MG TABS tablet TAKE 1 TABLET BY MOUTH IN THE MORNING FOR DIABETES (Patient not taking: Reported on 11/26/2021)    . levETIRAcetam (KEPPRA) 500 MG tablet Take 1 tablet (500 mg total) by mouth 2 (two) times daily. (Patient not taking: Reported on 11/26/2021) 60 tablet 1  . losartan (COZAAR) 100 MG tablet Take 100 mg by mouth daily. (Patient not taking: Reported on 11/26/2021)    . losartan-hydrochlorothiazide (HYZAAR) 100-25 MG tablet Take 1 tablet by mouth daily. (Patient not taking: Reported on 11/26/2021)    . meclizine (ANTIVERT) 12.5 MG tablet Take 1 tablet (12.5 mg total) by mouth 2 (two) times daily as needed for dizziness. (Patient  not taking: Reported on 11/26/2021) 10 tablet 0  . metFORMIN (GLUCOPHAGE) 500 MG tablet Take 500 mg by mouth 2 (two) times daily with a meal. (Patient not taking: Reported on 11/26/2021)    . oxybutynin (DITROPAN-XL) 10 MG 24 hr tablet Take 1 tablet (10 mg total) by mouth daily. (Patient not taking: Reported on 11/26/2021) 90 tablet 0  . QUEtiapine (SEROQUEL) 100 MG tablet Take 100 mg by mouth at bedtime.    . sildenafil (REVATIO) 20 MG tablet Take 3 to 5 tablets two hours before intercouse on an empty stomach.  Do not take with nitrates. (Patient not taking: Reported on 11/26/2021) 50 tablet 3  . sildenafil (VIAGRA) 100 MG tablet Take 1  tablet (100 mg total) by mouth daily as needed for erectile dysfunction. Take two hours prior to intercourse on an empty stomach (Patient not taking: Reported on 11/26/2021) 30 tablet 3    Musculoskeletal: Strength & Muscle Tone: {desc; muscle tone:32375} Gait & Station: {PE GAIT ED OACZ:66063} Patient leans: {Patient Leans:[id number]}            Psychiatric Specialty Exam:  Presentation  General Appearance: Bizarre  Eye Contact:Minimal  Speech:Garbled  Speech Volume:Decreased  Handedness:Right   Mood and Affect  Mood:Irritable  Affect:Inappropriate; Congruent   Thought Process  Thought Processes:Coherent  Descriptions of Associations:Intact  Orientation:Full (Time, Place and Person)  Thought Content:Logical  History of Schizophrenia/Schizoaffective disorder:No  Duration of Psychotic Symptoms:No data recorded Hallucinations:No data recorded Ideas of Reference:None  Suicidal Thoughts:No data recorded Homicidal Thoughts:No data recorded  Sensorium  Memory:Immediate Good; Recent Good; Remote Good  Judgment:Fair  Insight:Fair   Executive Functions  Concentration:Fair  Attention Span:Fair  [place]   Psychomotor Activity  Psychomotor Activity:No data recorded  Assets   Assets:Communication Skills; Desire for Improvement; Physical Health; Resilience; Social Support   Sleep  Sleep:No data recorded  Physical Exam: Physical Exam Vitals and nursing note reviewed.   ROS There were no vitals taken for this visit. There is no height or weight on file to calculate BMI.  Treatment Plan Summary: {CHL [facility] MD [place] KZSW:[id number]}  Disposition: {CHL [facility] Consult DUKG:25427}  Deloria Lair, NP 04/17/2022 11:54 PM

## 2022-04-17 NOTE — ED Triage Notes (Signed)
FIRST NURSE NOTE:  Pt arrived via [facility] from home, pt was found outside, pt c/o stomach cramps and having thoughts of harming himself and others.   VSS with EMS.

## 2022-04-17 NOTE — ED Triage Notes (Signed)
Pt presents to ER via ems from home c/o upper abd pain that started yesterday .  Pt endorses hx of prostate Ca, and states he is "tired of dealing with the cancer."  Pt endorses suicidal thought at this time along with thoughts of hurting others.  Pt endorses n/v/d associated with his abd pain.  Pt states he is not currently on chemo or radiation for cancer, though cancer is currently in remission.  Pt states he has been losing weight, despite not losing appetite.  Pt is currently A&O x4 at this time in NAD in triage.

## 2022-04-17 NOTE — Consult Note (Signed)
[facility] Face-to-Face Psychiatry Consult   Reason for Consult:  Psychiatric evaluation Referring Physician:  Dr. Karma Greaser Patient Identification: Alexander Avery MRN:  [id number] Principal Diagnosis: <principal problem not specified> Diagnosis:  Active Problems:   Prostate cancer ([place])   Polysubstance abuse ([place])   Major depressive disorder, recurrent severe without psychotic features ([place])   Total Time spent with patient: 45 minutes  Subjective:   "I cant take it anymore"  HPI:  Psych Assessment  Alexander Avery, 66 y.o., male patient seen  this provider; chart reviewed and consulted with Dr. Karma Greaser on 04/18/22.  Patient presents to the er with a history of prostate cancer and polysubstance abuse.  On evaluation Alexander Avery reports that he has been feeling really "down" and has been contemplating suicide since early during the day.  He has chronic pain as a result of his prostate cancer and says he cannot take it any longer. He reports living alone and states that he was scared to go home out of fear of what he might do to himself.  He says hes had these thoughts before and was hospitalized.  Per chart review, patient was admitted in January for SI.  Currently, patient UDS+ for marijuana and cocaine.    During evaluation Alexander Avery is laying  in bed; he is alert/oriented x 4; calm/cooperative/depressed; and mood congruent with affect.  Patient is speaking in a low tone and slowed pace; with fair eye contact.  His thought process is coherent and relevant; There is no indication that he is currently responding to internal/external stimuli or experiencing delusional thought content.  Patient endorses suicidal/self-harm and homicidal ideation.  There is no evidence of  psychosis, and paranoia.  Patient has remained calm throughout assessment and has answered questions appropriately.     Recommendations:  Inpatient Psychiatric hospitalization  Past Psychiatric History:  Polysubstance abuse   Risk to Self:   Risk to Others:   Prior Inpatient Therapy:   Prior Outpatient Therapy:    Past Medical History:  Past Medical History:  Diagnosis Date   Anxiety    Arthritis    Cancer ([place])    prostate   Cataract 2019   Diabetes mellitus without complication ([place])    GERD (gastroesophageal reflux disease)    Hernia of abdominal wall 2015   History of stomach ulcers    Hypertension    Prostate cancer ([place])    Seizures ([place])    Sleep apnea     Past Surgical History:  Procedure Laterality Date   CATARACT EXTRACTION W/PHACO Right 03/30/2018   Procedure: CATARACT EXTRACTION PHACO AND INTRAOCULAR LENS PLACEMENT ([place]);  Surgeon: Eulogio Bear, MD;  Location: [facility] ORS;  Service: Ophthalmology;  Laterality: Right;  [place] 01:09.5 AP% 10.4 CDE 7.98 Fluid pack lot # [id number]   CATARACT EXTRACTION W/PHACO Left 06/29/2018   Procedure: CATARACT EXTRACTION PHACO AND INTRAOCULAR LENS PLACEMENT (IOC);  Surgeon: Eulogio Bear, MD;  Location: [facility] ORS;  Service: Ophthalmology;  Laterality: Left;  [place] 01:27.7 AP% 8.2 CDE 7.27 Fluid pack lot # [id number]   EYE SURGERY     HERNIA REPAIR     PROSTATE SURGERY     PROSTATE SURGERY  2016   Family History:  Family History  Problem Relation Age of Onset   Cancer Mother    Family Psychiatric  History: uhnknown Social History:  Social History   Substance and Sexual Activity  Alcohol Use Yes   Alcohol/week: 2.0 standard drinks   Types: 2 Cans  of beer per week   Comment: 2 beers twice a week     Social History   Substance and Sexual Activity  Drug Use No    Social History   Socioeconomic History   Marital status: Single    Spouse name: Not on file   Number of children: Not on file   Years of education: Not on file   Highest education level: Not on file  Occupational History   Not on file  Tobacco Use   Smoking status: Some Days    Packs/day: 2.00    Types: Cigarettes   Smokeless tobacco: Never   Vaping Use   Vaping Use: Never used  Substance and Sexual Activity   Alcohol use: Yes    Alcohol/week: 2.0 standard drinks    Types: 2 Cans of beer per week    Comment: 2 beers twice a week   Drug use: No   Sexual activity: Not on file  Other Topics Concern   Not on file  Social History Narrative   Not on file   Social Determinants of Health   Financial Resource Strain: Not on file  Food Insecurity: Not on file  Transportation Needs: Not on file  Physical Activity: Not on file  Stress: Not on file  Social Connections: Not on file   Additional Social History:    Allergies:   Allergies  Allergen Reactions   Penicillins Hives and Other (See Comments)    Urinate on self, pass out Has patient had a PCN reaction causing immediate rash, facial/tongue/throat swelling, SOB or lightheadedness with hypotension: Yes Has patient had a PCN reaction causing severe rash involving mucus membranes or skin necrosis: No Has patient had a PCN reaction that required hospitalization: Yes- In hospital Has patient had a PCN reaction occurring within the last 10 years: Yes If all of the above answers are "NO", then may proceed with Cephalosporin use.  Other reaction(s): Other (See Comments) Urinate on self, pass out Has patient had a PCN reaction causing immediate rash, facial/tongue/throat swelling, SOB or lightheadedness with hypotension: Yes Has patient had a PCN reaction causing severe rash involving mucus membranes or skin necrosis: No Has patient had a PCN reaction that required hospitalization: Yes- In hospital Has patient had a PCN reaction occurring within the last 10 years: Yes If all of the above answers are "NO", then may proceed with Cephalosporin use. "I pass out"     Labs:  Results for orders placed or performed during the hospital encounter of 04/18/22 (from the past 48 hour(s))  Comprehensive metabolic panel     Status: Abnormal   Collection Time: 04/18/22 12:03 AM   Result Value Ref Range   Sodium 142 135 - 145 mmol/L   Potassium 3.6 3.5 - 5.1 mmol/L   Chloride 109 98 - 111 mmol/L   CO2 30 22 - 32 mmol/L   Glucose, Bld 81 70 - 99 mg/dL    Comment: Glucose reference range applies only to samples taken after fasting for at least 8 hours.   BUN 19 8 - 23 mg/dL   Creatinine, Ser 1.11 0.61 - 1.24 mg/dL   Calcium 8.8 (L) 8.9 - 10.3 mg/dL   Total Protein 7.6 6.5 - 8.1 g/dL   Albumin 3.8 3.5 - 5.0 g/dL   AST 32 15 - 41 U/L   ALT 22 0 - 44 U/L   Alkaline Phosphatase 60 38 - 126 U/L   Total Bilirubin 0.9 0.3 - 1.2 mg/dL   GFR,  Estimated >60 >60 mL/min    Comment: (NOTE) Calculated using the CKD-EPI Creatinine Equation (2021)    Anion gap 3 (L) 5 - 15    Comment: Performed at [facility], [address] [place], [place] [address]  Ethanol     Status: None   Collection Time: 04/18/22 12:03 AM  Result Value Ref Range   Alcohol, Ethyl (B) <10 <10 mg/dL    Comment: (NOTE) Lowest detectable limit for serum alcohol is 10 mg/dL.  For medical purposes only. Performed at [facility], [address] [place], [place] [address]   Salicylate level     Status: Abnormal   Collection Time: 04/18/22 12:03 AM  Result Value Ref Range   Salicylate Lvl <8.2 (L) 7.0 - 30.0 mg/dL    Comment: Performed at [facility], [address] [place], [place] [address]  Acetaminophen level     Status: Abnormal   Collection Time: 04/18/22 12:03 AM  Result Value Ref Range   Acetaminophen (Tylenol), Serum <10 (L) 10 - 30 ug/mL    Comment: (NOTE) Therapeutic concentrations vary significantly. A range of 10-30 ug/mL  may be an effective concentration for many patients. However, some  are best treated at concentrations outside of this range. Acetaminophen concentrations >150 ug/mL at 4 hours after ingestion  and >50 ug/mL at 12 hours after ingestion are often associated with  toxic reactions.  Performed at [facility], [address] [place], [place] [address]   cbc     Status: Abnormal   Collection Time: 04/18/22 12:03 AM  Result Value Ref Range   WBC 4.3 4.0 - 10.5 K/uL   RBC 4.38 4.22 - 5.81 MIL/uL   Hemoglobin 12.7 (L) 13.0 - 17.0 g/dL   HCT 35.8 (L) 39.0 - 52.0 %   MCV 81.7 80.0 - 100.0 fL   MCH 29.0 26.0 - 34.0 pg   MCHC 35.5 30.0 - 36.0 g/dL   RDW 14.5 11.5 - 15.5 %   Platelets 162 150 - 400 K/uL   nRBC 0.0 0.0 - 0.2 %    Comment: Performed at [facility], [address] [place], [place] [address]  Urine Drug Screen, Qualitative     Status: Abnormal   Collection Time: 04/18/22 12:03 AM  Result Value Ref Range   Tricyclic, Ur Screen NONE DETECTED NONE DETECTED   Amphetamines, Ur Screen NONE DETECTED NONE DETECTED   MDMA (Ecstasy)Ur Screen NONE DETECTED NONE DETECTED   Cocaine Metabolite,Ur [place] POSITIVE (A) NONE DETECTED   Opiate, Ur Screen NONE DETECTED NONE DETECTED   Phencyclidine (PCP) Ur S NONE DETECTED NONE DETECTED   Cannabinoid 50 Ng, Ur [place] POSITIVE (A) NONE DETECTED   Barbiturates, Ur Screen NONE DETECTED NONE DETECTED   Benzodiazepine, Ur Scrn NONE DETECTED NONE DETECTED   Methadone Scn, Ur NONE DETECTED NONE DETECTED    Comment: (NOTE) Tricyclics + metabolites, urine    Cutoff 1000 ng/mL Amphetamines + metabolites, urine  Cutoff 1000 ng/mL MDMA (Ecstasy), urine              Cutoff 500 ng/mL Cocaine Metabolite, urine          Cutoff 300 ng/mL Opiate + metabolites, urine        Cutoff 300 ng/mL Phencyclidine (PCP), urine         Cutoff 25 ng/mL Cannabinoid, urine                 Cutoff 50 ng/mL Barbiturates + metabolites, urine  Cutoff 200 ng/mL Benzodiazepine, urine              Cutoff 200 ng/mL Methadone, urine                   Cutoff 300 ng/mL  The urine drug screen provides only a preliminary, unconfirmed analytical test result and should not be used for non-medical purposes. Clinical consideration and professional judgment should be applied to any positive drug  screen result due to possible interfering substances. A more specific alternate chemical method must be used in order to obtain a confirmed analytical result. Gas chromatography / mass spectrometry (GC/MS) is the preferred confirm atory method. Performed at [facility], [address] [place], [place] [address]   Lipase, blood     Status: None   Collection Time: 04/18/22 12:03 AM  Result Value Ref Range   Lipase 32 11 - 51 U/L    Comment: Performed at [facility], [address] [place], [place] [address]  Urinalysis, Routine w reflex microscopic     Status: Abnormal   Collection Time: 04/18/22 12:03 AM  Result Value Ref Range   Color, Urine YELLOW (A) YELLOW   APPearance CLEAR (A) CLEAR   Specific Gravity, Urine 1.021 1.005 - 1.030   pH 5.0 5.0 - 8.0   Glucose, UA NEGATIVE NEGATIVE mg/dL   Hgb urine dipstick NEGATIVE NEGATIVE   Bilirubin Urine NEGATIVE NEGATIVE   Ketones, ur NEGATIVE NEGATIVE mg/dL   Protein, ur 30 (A) NEGATIVE mg/dL   Nitrite NEGATIVE NEGATIVE   Leukocytes,Ua NEGATIVE NEGATIVE   RBC / HPF 0-5 0 - 5 RBC/hpf   WBC, UA 6-10 0 - 5 WBC/hpf   Bacteria, UA NONE SEEN NONE SEEN   Squamous Epithelial / LPF NONE SEEN 0 - 5   Mucus PRESENT     Comment: Performed at [facility], [address] [place], [place] [address]    No current facility-administered medications for this encounter.   Current Outpatient Medications  Medication Sig Dispense Refill   SUBOXONE 8-2 MG FILM Place 1 strip under the tongue daily.     cloNIDine (CATAPRES) 0.1 MG tablet Take 0.1 mg by mouth 2 (two) times daily as needed.      Musculoskeletal: Strength & Muscle Tone: within normal limits Gait & Station: normal Patient leans: N/A    Psychiatric Specialty Exam:  Presentation  General Appearance: Casual; Appropriate for Environment  Eye Contact:Fair  Speech:Blocked; Slow  Speech Volume:Decreased  Handedness:Right   Mood and Affect   Mood:Depressed  Affect:Flat; Tearful   Thought Process  Thought Processes:Coherent  Descriptions of Associations:Intact  Orientation:Full (Time, Place and Person)  Thought Content:Logical; WDL  History of Schizophrenia/Schizoaffective disorder:No  Duration of Psychotic Symptoms:No data recorded Hallucinations:Hallucinations: None  Ideas of Reference:None  Suicidal Thoughts:Suicidal Thoughts: Yes, Active SI Active Intent and/or Plan: Without Plan  Homicidal Thoughts:Homicidal Thoughts: Yes, Passive HI Passive Intent and/or Plan: Without Plan   Sensorium  Memory:Immediate Fair  Judgment:Impaired  Insight:Fair   Executive Functions  Concentration:Fair  Attention Span:Fair  [place]   Psychomotor Activity  Psychomotor Activity:Psychomotor Activity: Normal  Assets  Assets:Communication Skills; Desire for Improvement; Financial Resources/Insurance; Housing   Sleep  Sleep:Sleep: Poor  Physical Exam: Physical Exam Vitals and nursing note reviewed.  Constitutional:      Appearance: He is normal weight.  HENT:     Head: Normocephalic and atraumatic.  Eyes:     Extraocular Movements: Extraocular movements intact.  Pupils: Pupils are equal, round, and reactive to light.  Pulmonary:     Effort: Pulmonary effort is normal.  Musculoskeletal:        General: Normal range of motion.  Skin:    General: Skin is dry.  Neurological:     Mental Status: He is oriented to person, place, and time.  Psychiatric:        Attention and Perception: Attention normal.        Mood and Affect: Mood is depressed.        Speech: Speech is delayed.        Behavior: Behavior is slowed and withdrawn. Behavior is cooperative.        Thought Content: Thought content includes homicidal and suicidal ideation.        Cognition and Memory: Cognition and memory normal.        Judgment: Judgment is impulsive and inappropriate.    Review of Systems  Psychiatric/Behavioral:  Positive for depression, substance abuse and suicidal ideas. Negative for hallucinations. The patient is not nervous/anxious.   All other systems reviewed and are negative. Blood pressure (!) 182/80, pulse 60, temperature 98.5 F (36.9 C), temperature source Oral, resp. rate 16, height '5\' 5"'$  (1.651 m), weight 50.3 kg, SpO2 96 %. Body mass index is 18.47 kg/m.  Treatment Plan Summary: Daily contact with patient to assess and evaluate symptoms and progress in treatment, Medication management, and Plan   Plan:  Review of chart, vital signs, medications, and notes. 1-Individual and group therapy 2-Medication management for depression:  Medications reviewed with the patient and he stated no untoward effects, no changes made 3-Coping skills for depression, and anxiety,  4-Continue crisis stabilization and management 5-Address health issues--monitoring vital signs, stable 6-Treatment plan in progress to prevent relapse of depression and anxiety  Disposition: Recommend psychiatric Inpatient admission when medically cleared. Supportive therapy provided about ongoing stressors. Refer to IOP. Discussed crisis plan, support from social network, calling 911, coming to the Emergency Department, and calling Suicide Hotline.  Deloria Lair, NP 04/18/2022 2:07 AM

## 2022-04-18 ENCOUNTER — Emergency Department
Admission: EM | Admit: 2022-04-18 | Discharge: 2022-04-18 | Disposition: A | Discharge status: Home / Self Care | Payer: Medicare Other | Attending: Emergency Medicine | Admitting: Emergency Medicine

## 2022-04-18 ENCOUNTER — Other Ambulatory Visit: Payer: Self-pay

## 2022-04-18 DIAGNOSIS — C61 Malignant neoplasm of prostate: Secondary | ICD-10-CM

## 2022-04-18 DIAGNOSIS — F1910 Other psychoactive substance abuse, uncomplicated: Secondary | ICD-10-CM

## 2022-04-18 DIAGNOSIS — F332 Major depressive disorder, recurrent severe without psychotic features: Secondary | ICD-10-CM | POA: Diagnosis present

## 2022-04-18 DIAGNOSIS — F1414 Cocaine abuse with cocaine-induced mood disorder: Secondary | ICD-10-CM

## 2022-04-18 DIAGNOSIS — I10 Essential (primary) hypertension: Secondary | ICD-10-CM

## 2022-04-18 DIAGNOSIS — G8929 Other chronic pain: Secondary | ICD-10-CM

## 2022-04-18 DIAGNOSIS — F32A Depression, unspecified: Secondary | ICD-10-CM

## 2022-04-18 LAB — URINALYSIS, ROUTINE W REFLEX MICROSCOPIC
Bacteria, UA: NONE SEEN
Bilirubin Urine: NEGATIVE
Glucose, UA: NEGATIVE mg/dL
Hgb urine dipstick: NEGATIVE
Ketones, ur: NEGATIVE mg/dL
Leukocytes,Ua: NEGATIVE
Nitrite: NEGATIVE
Protein, ur: 30 mg/dL — AB
Specific Gravity, Urine: 1.021 (ref 1.005–1.030)
Squamous Epithelial / HPF: NONE SEEN (ref 0–5)
pH: 5 (ref 5.0–8.0)

## 2022-04-18 LAB — COMPREHENSIVE METABOLIC PANEL
ALT: 22 U/L (ref 0–44)
AST: 32 U/L (ref 15–41)
Albumin: 3.8 g/dL (ref 3.5–5.0)
Alkaline Phosphatase: 60 U/L (ref 38–126)
Anion gap: 3 — ABNORMAL LOW (ref 5–15)
BUN: 19 mg/dL (ref 8–23)
CO2: 30 mmol/L (ref 22–32)
Calcium: 8.8 mg/dL — ABNORMAL LOW (ref 8.9–10.3)
Chloride: 109 mmol/L (ref 98–111)
Creatinine, Ser: 1.11 mg/dL (ref 0.61–1.24)
GFR, Estimated: >60 mL/min (ref >60)
Glucose, Bld: 81 mg/dL (ref 70–99)
Potassium: 3.6 mmol/L (ref 3.5–5.1)
Sodium: 142 mmol/L (ref 135–145)
Total Bilirubin: 0.9 mg/dL (ref 0.3–1.2)
Total Protein: 7.6 g/dL (ref 6.5–8.1)

## 2022-04-18 LAB — URINE DRUG SCREEN, QUALITATIVE (ARMC ONLY)
Amphetamines, Ur Screen: NOT DETECTED
Barbiturates, Ur Screen: NOT DETECTED
Benzodiazepine, Ur Scrn: NOT DETECTED
Cannabinoid 50 Ng, Ur ~~LOC~~: POSITIVE — AB
Cocaine Metabolite,Ur ~~LOC~~: POSITIVE — AB
MDMA (Ecstasy)Ur Screen: NOT DETECTED
Methadone Scn, Ur: NOT DETECTED
Opiate, Ur Screen: NOT DETECTED
Phencyclidine (PCP) Ur S: NOT DETECTED
Tricyclic, Ur Screen: NOT DETECTED

## 2022-04-18 LAB — CBC
HCT: 35.8 % — ABNORMAL LOW (ref 39.0–52.0)
Hemoglobin: 12.7 g/dL — ABNORMAL LOW (ref 13.0–17.0)
MCH: 29 pg (ref 26.0–34.0)
MCHC: 35.5 g/dL (ref 30.0–36.0)
MCV: 81.7 fL (ref 80.0–100.0)
Platelets: 162 10*3/uL (ref 150–400)
RBC: 4.38 MIL/uL (ref 4.22–5.81)
RDW: 14.5 % (ref 11.5–15.5)
WBC: 4.3 10*3/uL (ref 4.0–10.5)
nRBC: 0 % (ref 0.0–0.2)

## 2022-04-18 LAB — LIPASE, BLOOD: Lipase: 32 U/L (ref 11–51)

## 2022-04-18 LAB — ETHANOL: Alcohol, Ethyl (B): <10 mg/dL (ref <10)

## 2022-04-18 LAB — SALICYLATE LEVEL: Salicylate Lvl: <7 mg/dL — ABNORMAL LOW (ref 7.0–30.0)

## 2022-04-18 LAB — ACETAMINOPHEN LEVEL: Acetaminophen (Tylenol), Serum: <10 ug/mL — ABNORMAL LOW (ref 10–30)

## 2022-04-18 MED ORDER — BUPRENORPHINE HCL-NALOXONE HCL 8-2 MG SL SUBL: 1.0000 | SUBLINGUAL_TABLET | Freq: Once | SUBLINGUAL | Status: DC

## 2022-04-18 MED ORDER — BUPRENORPHINE HCL-NALOXONE HCL 8-2 MG SL SUBL
1.0000 | SUBLINGUAL_TABLET | Freq: Every day | SUBLINGUAL | Status: DC
  Administered 2022-04-18: 1 via SUBLINGUAL
  Filled 2022-04-18: qty 1

## 2022-04-18 NOTE — ED Notes (Signed)
Patient reports history of cancer, diabetes and hypertension.  Reports that he has been thinking about killing himself.  Patient reports history of depression with admission recently.

## 2022-04-18 NOTE — ED Notes (Signed)
Pt dressed out in triage room with this RN and Arby Barrette, EDT.  Pt belongings   Black t-shirt  Black sweat pants  White tennis shoes  Black ball cap  White t-shirt  White tank top  [employer] rx meds (suboxone)  Red and black rx glasses  Black wallet Photo ID 2 packs of cigarettes  Maroon scrub pants Green lighter

## 2022-04-18 NOTE — ED Provider Notes (Signed)
Patient seen by psych and provided with resources, cleared. No indication for admission at this time. Outpt f/u encouraged.   Duffy Bruce, MD 04/18/22 1017

## 2022-04-18 NOTE — ED Provider Notes (Signed)
[facility] Provider Note    Event Date/Time   First MD Initiated Contact with Patient 04/18/22 0017     (approximate)   History   Abdominal Pain and Psychiatric Evaluation   HPI  Alexander Avery is a 66 y.o. male with a history of prostate cancer and depression.  He presents voluntarily reporting persistent pain in his abdomen that he believes is secondary to his prostate cancer and worsening depression.  He says he wants to die because he is tired of dealing with his medical situation.  He is not undergoing any treatment.  He has not had any worse nausea, vomiting, abdominal pain, nor dysuria than usual, he is just tired of dealing with the symptoms.  He denies fever, shortness of breath, and chest pain.     Physical Exam   Triage Vital Signs: ED Triage Vitals  Enc Vitals Group     BP 04/18/22 0003 (!) 182/80     Pulse Rate 04/18/22 0003 60     Resp 04/18/22 0003 16     Temp 04/18/22 0003 98.5 F (36.9 C)     Temp Source 04/18/22 0003 Oral     SpO2 04/18/22 0003 96 %     Weight 04/18/22 0004 50.3 kg (111 lb)     Height 04/18/22 0004 1.651 m ('5\' 5"'$ )     Head Circumference --      Peak Flow --      Pain Score 04/18/22 0004 6     Pain Loc --      Pain Edu? --      Excl. in [place]? --     Most recent vital signs: Vitals:   04/18/22 0003  BP: (!) 182/80  Pulse: 60  Resp: 16  Temp: 98.5 F (36.9 C)  SpO2: 96%     General: Awake, no distress.  CV:  Good peripheral perfusion.  Resp:  Normal effort.  Abd:  No distention.  No tenderness to palpation of the abdomen. Other:  Patient is calm and cooperative.  Reports depression and suicidal ideation.  Denies any pain at this time and says he just wants to sleep.   ED Results / Procedures / Treatments   Labs (all labs ordered are listed, but only abnormal results are displayed) Labs Reviewed  COMPREHENSIVE METABOLIC PANEL - Abnormal; Notable for the following components:      Result Value    Calcium 8.8 (*)    Anion gap 3 (*)    All other components within normal limits  SALICYLATE LEVEL - Abnormal; Notable for the following components:   Salicylate Lvl <5.7 (*)    All other components within normal limits  ACETAMINOPHEN LEVEL - Abnormal; Notable for the following components:   Acetaminophen (Tylenol), Serum <10 (*)    All other components within normal limits  CBC - Abnormal; Notable for the following components:   Hemoglobin 12.7 (*)    HCT 35.8 (*)    All other components within normal limits  URINE DRUG SCREEN, QUALITATIVE ([facility] ONLY) - Abnormal; Notable for the following components:   Cocaine Metabolite,Ur [place] POSITIVE (*)    Cannabinoid 50 Ng, Ur [place] POSITIVE (*)    All other components within normal limits  URINALYSIS, ROUTINE W REFLEX MICROSCOPIC - Abnormal; Notable for the following components:   Color, Urine YELLOW (*)    APPearance CLEAR (*)    Protein, ur 30 (*)    All other components within normal limits  SARS CORONAVIRUS  2 BY RT PCR  ETHANOL  LIPASE, BLOOD    PROCEDURES:  Critical Care performed: No  Procedures   MEDICATIONS ORDERED IN ED: Medications - No data to display   IMPRESSION / MDM / [place] / ED COURSE  I reviewed the triage vital signs and the nursing notes.                              Differential diagnosis includes, but is not limited to, depression, suicidal ideation, adjustment disorder, mood disorder, chronic pain, metabolic or electrolyte abnormality, acute infection, complication of cancer.  Patient's presentation is most consistent with severe exacerbation of chronic illness.  The patient has been admitted to psychiatry before for his depression and associated with his chronic medical condition.  He is reporting that his medical symptoms are essentially stable and chronic.  His vital signs are reassuring other than hypertension.  Physical exam is reassuring with no tenderness to palpation of the abdomen and no  respiratory difficulties.  As per protocol, I ordered the following labs as part of the patient's medical and psychiatric evaluation:  CBC, CMP, ethanol level, acetaminophen level, salicylate level, urine drug screen, respiratory viral panel.  Labs are generally unremarkable and stable other than urine drug screen positive for cocaine and cannabinoids.  No evidence of acute infection or metabolic abnormality.  No indication for emergent imaging at this time.  Consulting psychiatry.  The patient has been placed in psychiatric observation due to the need to provide a safe environment for the patient while obtaining psychiatric consultation and evaluation, as well as ongoing medical and medication management to treat the patient's condition.  The patient has not been placed under full IVC at this time.    Clinical Course as of 04/18/22 0430  Sun Apr 18, 2022  0144 Alexander Avery with psychiatry consulted on the patient and agrees to admit him to the behavioral unit. [CF]    Clinical Course User Index [CF] Hinda Kehr, MD     FINAL CLINICAL IMPRESSION(S) / ED DIAGNOSES   Final diagnoses:  Depression, unspecified depression type  Other chronic pain  Essential hypertension     Rx / DC Orders   ED Discharge Orders     None        Note:  This document was prepared using Dragon voice recognition software and may include unintentional dictation errors.   Hinda Kehr, MD 04/18/22 0430

## 2022-04-18 NOTE — ED Notes (Signed)
Psych NP speaking with patient

## 2022-04-18 NOTE — ED Notes (Signed)
Returned suboxone from pharmacy to patient

## 2022-04-18 NOTE — BH Assessment (Signed)
Writer spoke with the patient to complete an updated/reassessment. Patient denies SI/HI and AV/H. Patient is connected with [facility] and staring their SAIOP this week.

## 2022-04-18 NOTE — ED Notes (Signed)
Pt has not had his clonidine today for his BP. Pt states he has it in his belongings and will take it as soon as he leaves. Denies any symptoms of HTN at this time.

## 2022-04-18 NOTE — Consult Note (Signed)
[facility] Face-to-Face Psychiatry Consult   Reason for Consult:  suicidal ideations after using cocaine Referring Physician:  EDP Patient Identification: Alexander Avery MRN:  [id number] Principal Diagnosis: Cocaine abuse with cocaine-induced mood disorder ([place]) Diagnosis:  Principal Problem:   Cocaine abuse with cocaine-induced mood disorder ([place]) Active Problems:   Major depressive disorder, recurrent severe without psychotic features ([place])   Prostate cancer ([place])   Polysubstance abuse ([place])   Total Time spent with patient: 45 minutes  Subjective:   Alexander Avery is a 66 y.o. male patient admitted with cocaine abuse with suicidal ideations.  HPI:  66 yo male with cocaine abuse and suicidal ideations on admission.  Today, he reports being "alright" as he heartily eats his breakfast.  When asked about the suicidal ideations, he states, "they left."  Denies withdrawal symptoms, homicidal ideations, hallucinations, and paranoia.  He request to leave so he can follow up with substance abuse IOP at [facility] that he starts tomorrow.  Past Psychiatric History: polysubstance use d/o, depression  Risk to Self:  none Risk to Others:  none Prior Inpatient Therapy:  a few Prior Outpatient Therapy:  [facility]  Past Medical History:  Past Medical History:  Diagnosis Date   Anxiety    Arthritis    Cancer ([place])    prostate   Cataract 2019   Diabetes mellitus without complication ([place])    GERD (gastroesophageal reflux disease)    Hernia of abdominal wall 2015   History of stomach ulcers    Hypertension    Prostate cancer ([place])    Seizures ([place])    Sleep apnea     Past Surgical History:  Procedure Laterality Date   CATARACT EXTRACTION W/PHACO Right 03/30/2018   Procedure: CATARACT EXTRACTION PHACO AND INTRAOCULAR LENS PLACEMENT ([place]);  Surgeon: Eulogio Bear, MD;  Location: [facility] ORS;  Service: Ophthalmology;  Laterality: Right;  [place] 01:09.5 AP% 10.4 CDE 7.98 Fluid pack lot # [id number]    CATARACT EXTRACTION W/PHACO Left 06/29/2018   Procedure: CATARACT EXTRACTION PHACO AND INTRAOCULAR LENS PLACEMENT (IOC);  Surgeon: Eulogio Bear, MD;  Location: [facility] ORS;  Service: Ophthalmology;  Laterality: Left;  [place] 01:27.7 AP% 8.2 CDE 7.27 Fluid pack lot # [id number]   EYE SURGERY     HERNIA REPAIR     PROSTATE SURGERY     PROSTATE SURGERY  2016   Family History:  Family History  Problem Relation Age of Onset   Cancer Mother    Family Psychiatric  History: none Social History:  Social History   Substance and Sexual Activity  Alcohol Use Yes   Alcohol/week: 2.0 standard drinks   Types: 2 Cans of beer per week   Comment: 2 beers twice a week     Social History   Substance and Sexual Activity  Drug Use No    Social History   Socioeconomic History   Marital status: Single    Spouse name: Not on file   Number of children: Not on file   Years of education: Not on file   Highest education level: Not on file  Occupational History   Not on file  Tobacco Use   Smoking status: Some Days    Packs/day: 2.00    Types: Cigarettes   Smokeless tobacco: Never  Vaping Use   Vaping Use: Never used  Substance and Sexual Activity   Alcohol use: Yes    Alcohol/week: 2.0 standard drinks    Types: 2 Cans of beer per week  Comment: 2 beers twice a week   Drug use: No   Sexual activity: Not on file  Other Topics Concern   Not on file  Social History Narrative   Not on file   Social Determinants of Health   Financial Resource Strain: Not on file  Food Insecurity: Not on file  Transportation Needs: Not on file  Physical Activity: Not on file  Stress: Not on file  Social Connections: Not on file   Additional Social History:    Allergies:   Allergies  Allergen Reactions   Penicillins Hives and Other (See Comments)    Urinate on self, pass out Has patient had a PCN reaction causing immediate rash, facial/tongue/throat swelling, SOB or lightheadedness with  hypotension: Yes Has patient had a PCN reaction causing severe rash involving mucus membranes or skin necrosis: No Has patient had a PCN reaction that required hospitalization: Yes- In hospital Has patient had a PCN reaction occurring within the last 10 years: Yes If all of the above answers are "NO", then may proceed with Cephalosporin use.  Other reaction(s): Other (See Comments) Urinate on self, pass out Has patient had a PCN reaction causing immediate rash, facial/tongue/throat swelling, SOB or lightheadedness with hypotension: Yes Has patient had a PCN reaction causing severe rash involving mucus membranes or skin necrosis: No Has patient had a PCN reaction that required hospitalization: Yes- In hospital Has patient had a PCN reaction occurring within the last 10 years: Yes If all of the above answers are "NO", then may proceed with Cephalosporin use. "I pass out"     Labs:  Results for orders placed or performed during the hospital encounter of 04/18/22 (from the past 48 hour(s))  Comprehensive metabolic panel     Status: Abnormal   Collection Time: 04/18/22 12:03 AM  Result Value Ref Range   Sodium 142 135 - 145 mmol/L   Potassium 3.6 3.5 - 5.1 mmol/L   Chloride 109 98 - 111 mmol/L   CO2 30 22 - 32 mmol/L   Glucose, Bld 81 70 - 99 mg/dL    Comment: Glucose reference range applies only to samples taken after fasting for at least 8 hours.   BUN 19 8 - 23 mg/dL   Creatinine, Ser 1.11 0.61 - 1.24 mg/dL   Calcium 8.8 (L) 8.9 - 10.3 mg/dL   Total Protein 7.6 6.5 - 8.1 g/dL   Albumin 3.8 3.5 - 5.0 g/dL   AST 32 15 - 41 U/L   ALT 22 0 - 44 U/L   Alkaline Phosphatase 60 38 - 126 U/L   Total Bilirubin 0.9 0.3 - 1.2 mg/dL   GFR, Estimated >60 >60 mL/min    Comment: (NOTE) Calculated using the CKD-EPI Creatinine Equation (2021)    Anion gap 3 (L) 5 - 15    Comment: Performed at [facility], [address] [place], [place] [address]  Ethanol     Status: None    Collection Time: 04/18/22 12:03 AM  Result Value Ref Range   Alcohol, Ethyl (B) <10 <10 mg/dL    Comment: (NOTE) Lowest detectable limit for serum alcohol is 10 mg/dL.  For medical purposes only. Performed at [facility], [address] [place],  [address]   Salicylate level     Status: Abnormal   Collection Time: 04/18/22 12:03 AM  Result Value Ref Range   Salicylate Lvl <0.8 (L) 7.0 - 30.0 mg/dL    Comment: Performed at [facility], [address] [place], [place] [address]  Acetaminophen level     Status: Abnormal   Collection Time: 04/18/22 12:03 AM  Result Value Ref Range   Acetaminophen (Tylenol), Serum <10 (L) 10 - 30 ug/mL    Comment: (NOTE) Therapeutic concentrations vary significantly. A range of 10-30 ug/mL  may be an effective concentration for many patients. However, some  are best treated at concentrations outside of this range. Acetaminophen concentrations >150 ug/mL at 4 hours after ingestion  and >50 ug/mL at 12 hours after ingestion are often associated with  toxic reactions.  Performed at [facility], [address] [place], [place] [address]   cbc     Status: Abnormal   Collection Time: 04/18/22 12:03 AM  Result Value Ref Range   WBC 4.3 4.0 - 10.5 K/uL   RBC 4.38 4.22 - 5.81 MIL/uL   Hemoglobin 12.7 (L) 13.0 - 17.0 g/dL   HCT 35.8 (L) 39.0 - 52.0 %   MCV 81.7 80.0 - 100.0 fL   MCH 29.0 26.0 - 34.0 pg   MCHC 35.5 30.0 - 36.0 g/dL   RDW 14.5 11.5 - 15.5 %   Platelets 162 150 - 400 K/uL   nRBC 0.0 0.0 - 0.2 %    Comment: Performed at [facility], [address], [place], [place] [address]  Urine Drug Screen, Qualitative     Status: Abnormal   Collection Time: 04/18/22 12:03 AM  Result Value Ref Range   Tricyclic, Ur Screen NONE DETECTED NONE DETECTED   Amphetamines, Ur Screen NONE DETECTED NONE DETECTED   MDMA (Ecstasy)Ur Screen NONE DETECTED NONE DETECTED   Cocaine Metabolite,Ur [place] POSITIVE (A)  NONE DETECTED   Opiate, Ur Screen NONE DETECTED NONE DETECTED   Phencyclidine (PCP) Ur S NONE DETECTED NONE DETECTED   Cannabinoid 50 Ng, Ur [place] POSITIVE (A) NONE DETECTED   Barbiturates, Ur Screen NONE DETECTED NONE DETECTED   Benzodiazepine, Ur Scrn NONE DETECTED NONE DETECTED   Methadone Scn, Ur NONE DETECTED NONE DETECTED    Comment: (NOTE) Tricyclics + metabolites, urine    Cutoff 1000 ng/mL Amphetamines + metabolites, urine  Cutoff 1000 ng/mL MDMA (Ecstasy), urine              Cutoff 500 ng/mL Cocaine Metabolite, urine          Cutoff 300 ng/mL Opiate + metabolites, urine        Cutoff 300 ng/mL Phencyclidine (PCP), urine         Cutoff 25 ng/mL Cannabinoid, urine                 Cutoff 50 ng/mL Barbiturates + metabolites, urine  Cutoff 200 ng/mL Benzodiazepine, urine              Cutoff 200 ng/mL Methadone, urine                   Cutoff 300 ng/mL  The urine drug screen provides only a preliminary, unconfirmed analytical test result and should not be used for non-medical purposes. Clinical consideration and professional judgment should be applied to any positive drug screen result due to possible interfering substances. A more specific alternate chemical method must be used in order to obtain a confirmed analytical result. Gas chromatography / mass spectrometry (GC/MS) is the preferred confirm atory method. Performed at [facility], [address] [place], [place] [address]   Lipase, blood     Status: None   Collection Time: 04/18/22 12:03 AM  Result  Value Ref Range   Lipase 32 11 - 51 U/L    Comment: Performed at [facility], [address] [place], [place] [address]  Urinalysis, Routine w reflex microscopic     Status: Abnormal   Collection Time: 04/18/22 12:03 AM  Result Value Ref Range   Color, Urine YELLOW (A) YELLOW   APPearance CLEAR (A) CLEAR   Specific Gravity, Urine 1.021 1.005 - 1.030   pH 5.0 5.0 - 8.0   Glucose, UA NEGATIVE NEGATIVE  mg/dL   Hgb urine dipstick NEGATIVE NEGATIVE   Bilirubin Urine NEGATIVE NEGATIVE   Ketones, ur NEGATIVE NEGATIVE mg/dL   Protein, ur 30 (A) NEGATIVE mg/dL   Nitrite NEGATIVE NEGATIVE   Leukocytes,Ua NEGATIVE NEGATIVE   RBC / HPF 0-5 0 - 5 RBC/hpf   WBC, UA 6-10 0 - 5 WBC/hpf   Bacteria, UA NONE SEEN NONE SEEN   Squamous Epithelial / LPF NONE SEEN 0 - 5   Mucus PRESENT     Comment: Performed at [facility], [address] [place], [place] [address]    Current Facility-Administered Medications  Medication Dose Route Frequency Provider Last Rate Last Admin   buprenorphine-naloxone (SUBOXONE) 8-2 mg per SL tablet 1 tablet  1 tablet Sublingual Daily Duffy Bruce, MD   1 tablet at 04/18/22 0955   Current Outpatient Medications  Medication Sig Dispense Refill   SUBOXONE 8-2 MG FILM Place 1 strip under the tongue daily.     cloNIDine (CATAPRES) 0.1 MG tablet Take 0.1 mg by mouth 2 (two) times daily as needed.      Musculoskeletal: Strength & Muscle Tone: within normal limits Gait & Station: normal Patient leans: N/A  Psychiatric Specialty Exam: Physical Exam Vitals and nursing note reviewed.  Constitutional:      Appearance: He is well-developed.  HENT:     Head: Normocephalic.  Neurological:     General: No focal deficit present.     Mental Status: He is alert and oriented to person, place, and time.  Psychiatric:        Attention and Perception: Attention and perception normal.        Mood and Affect: Mood is anxious.        Speech: Speech normal.        Behavior: Behavior normal. Behavior is cooperative.        Thought Content: Thought content normal.        Cognition and Memory: Cognition and memory normal.        Judgment: Judgment normal.    Review of Systems  Psychiatric/Behavioral:  Positive for substance abuse. The patient is nervous/anxious.   All other systems reviewed and are negative.  Blood pressure (!) 182/80, pulse 60, temperature 98.5 F  (36.9 C), temperature source Oral, resp. rate 16, height '5\' 5"'$  (1.651 m), weight 50.3 kg, SpO2 96 %.Body mass index is 18.47 kg/m.  General Appearance: Disheveled  Eye Contact:  Good  Speech:  Normal Rate  Volume:  Normal  Mood:  Anxious  Affect:  Congruent  Thought Process:  Coherent and Descriptions of Associations: Intact  Orientation:  Full (Time, Place, and Person)  Thought Content:  WDL and Logical  Suicidal Thoughts:  No  Homicidal Thoughts:  No  Memory:  Immediate;   Good Recent;   Good Remote;   Good  Judgement:  Fair  Insight:  Fair  Psychomotor Activity:  Normal  Concentration:  Concentration: Fair and Attention Span: Fair  Recall:  [employer]  of Knowledge:  Good  Language:  Good  Akathisia:  No  Handed:  Right  AIMS (if indicated):     Assets:  Housing Leisure Time Physical Health Resilience Social Support  ADL's:  Intact  Cognition:  WNL  Sleep:         Physical Exam: Physical Exam Vitals and nursing note reviewed.  Constitutional:      Appearance: He is well-developed.  HENT:     Head: Normocephalic.  Neurological:     General: No focal deficit present.     Mental Status: He is alert and oriented to person, place, and time.  Psychiatric:        Attention and Perception: Attention and perception normal.        Mood and Affect: Mood is anxious.        Speech: Speech normal.        Behavior: Behavior normal. Behavior is cooperative.        Thought Content: Thought content normal.        Cognition and Memory: Cognition and memory normal.        Judgment: Judgment normal.   Review of Systems  Psychiatric/Behavioral:  Positive for substance abuse. The patient is nervous/anxious.   All other systems reviewed and are negative. Blood pressure (!) 182/80, pulse 60, temperature 98.5 F (36.9 C), temperature source Oral, resp. rate 16, height '5\' 5"'$  (1.651 m), weight 50.3 kg, SpO2 96 %. Body mass index is 18.47 kg/m.  Treatment Plan Summary: Cocaine  abuse with cocaine induced mood disorder: Follow up with [facility]  Disposition: No evidence of imminent risk to self or others at present.   Patient does not meet criteria for psychiatric inpatient admission.  Waylan Boga, NP 04/18/2022 10:01 AM

## 2022-04-18 NOTE — BH Assessment (Signed)
Comprehensive Clinical Assessment (CCA) Note  04/18/2022 Alexander Avery [id number]  Chief Complaint:  Chief Complaint  Patient presents with   Abdominal Pain   Psychiatric Evaluation   Visit Diagnosis:  Prostate cancer ([place])   Polysubstance abuse ([place])   Major depressive disorder, recurrent severe without psychotic features ([place])       Alexander Avery, 66 y.o., male patient seen  this provider. Patient explains that he is battling cancer and has begun to have suicidal ideations secondary to chronic illness and chronic pain. Per his report he recently began to have auditory hallucinations. PT presents as flat but is appropriate and answers most questions appropriately throughout the duration of the assessment. He explains that he began experiencing suicidal auditions approximately 2 days ago but denies having any plan or intent. When asked if he currently had thoughts of hurting others, patient denied. He confirms that hes been crying compliant with medication request since most recent discharge from [facility] in January of this year. A behavioral health assessment has been completed including evaluation of the patient, collecting collateral history:, reviewing available medical/clinic records, evaluating his unique risk and protective factors, and discussing treatment recommendations.     CCA Screening, Triage and Referral (STR)  Patient Reported Information How did you hear about [place]? Walk in  Referral name: No data recorded Referral phone number: No data recorded  Whom do you see for routine medical problems? No data recorded Practice/Facility Name: No data recorded Practice/Facility Phone Number: No data recorded Name of Contact: No data recorded Contact Number: No data recorded Contact Fax Number: No data recorded Prescriber Name: No data recorded Prescriber Address (if known): No data recorded  What Is the Reason for Your  Visit/Call Today? Pt presents with complaints of recent HI/AH and active SI.  How Long Has This Been Causing You Problems? <Week  What Do You Feel Would Help You the Most Today? Treatment for Depression or other mood problem; Medication(s); Stress Management   Have You Recently Been in Any Inpatient Treatment (Hospital/Detox/Crisis Center/28-Day Program)? Yes  Name/Location of Program/Hospital: [facility] How Long Were You There? No data recorded When Were You Discharged? 11/2021  Have You Ever Received Services From [employer] Before? No data recorded Who Do You See at [facility]? No data recorded  Have You Recently Had Any Thoughts About Hurting Yourself? Yes  Are You Planning to Commit Suicide/Harm Yourself At This time? No   Have you Recently Had Thoughts About Dent? Yes  Explanation: No data recorded  Have You Used Any Alcohol or Drugs in the Past 24 Hours? Yes  How Long Ago Did You Use Drugs or Alcohol? No data recorded What Did You Use and How Much? N/A  Do You Currently Have a Therapist/Psychiatrist? No  Name of Therapist/Psychiatrist: No data recorded  Have You Been Recently Discharged From Any Office Practice or Programs? Yes  Explanation of Discharge From Practice/Program: 11/2021  INPATIENT [facility]     CCA Screening Triage Referral Assessment Type of Contact: Face-to-Face  Is this Initial or Reassessment?  Initial  Collateral Involvement: None provided   Does Patient Have a Court Appointed Legal Guardian? No data recorded Name and Contact of Legal Guardian: No data recorded If Minor and Not Living with Parent(s), Who has Custody? n/a    Patient Determined To Be At Risk for Harm To Self or Others Based on Review of Patient Reported Information or Presenting  Complaint? Yes, for Self-Harm  Method: No data recorded Availability of Means: No data recorded Intent: No data recorded Notification Required: No data  recorded Additional Information for Danger to Others Potential: No data recorded Additional Comments for Danger to Others Potential: No data recorded Are There Guns or Other Weapons in Your Home? No data recorded Types of Guns/Weapons: No data recorded Are These Weapons Safely Secured?                            No data recorded Who Could Verify You Are Able To Have These Secured: No data recorded Do You Have any Outstanding Charges, Pending Court Dates, Parole/Probation? No data recorded Contacted To Inform of Risk of Harm To Self or Others: No data recorded  Location of Assessment: [facility] ED   Does Patient Present under Involuntary Commitment? No  IVC Papers Initial File Date: 11/26/21   [place] of Residence: [place]   Patient Currently Receiving the Following Services: Not Receiving Services   Determination of Need: Urgent (48 hours)   Options For Referral: Inpatient Hospitalization     CCA Biopsychosocial Intake/Chief Complaint:   Current Symptoms/Problems:   Patient Reported Schizophrenia/Schizoaffective Diagnosis in Past: No   Strengths: Patient is seekings support.  Preferences: No data recorded Abilities: No data recorded  Type of Services Patient Feels are Needed: Inpatient admission  Initial Clinical Notes/Concerns: No data recorded  Mental Health Symptoms Depression:   Change in energy/activity; Worthlessness; Difficulty Concentrating   Duration of Depressive symptoms:  Greater than two weeks   Mania:   None   Anxiety:    Difficulty concentrating; Irritability; Restlessness; Worrying   Psychosis:   Hallucinations   Duration of Psychotic symptoms:  Less than six months   Trauma:   Irritability/anger; Re-experience of traumatic event   Obsessions:   None   Compulsions:   None   Inattention:   None   Hyperactivity/Impulsivity:   None   Oppositional/Defiant Behaviors:   None   Emotional Irregularity:   Potentially harmful  impulsivity; Recurrent suicidal behaviors/gestures/threats   Other Mood/Personality Symptoms:  No data recorded   Mental Status Exam Appearance and self-care  Stature:   Average   Weight:   Average weight   Clothing:   Age-appropriate   Grooming:   Normal   Cosmetic use:   None   Posture/gait:   Normal   Motor activity:   Not Remarkable   Sensorium  Attention:   Normal   Concentration:   Normal   Orientation:   X5   Recall/memory:   Normal   Affect and Mood  Affect:   Appropriate; Depressed   Mood:   Depressed; Anxious   Relating  Eye contact:   Normal   Facial expression:   Responsive; Depressed   Attitude toward examiner:   Cooperative   Thought and Language  Speech flow:  Clear and Coherent   Thought content:   Appropriate to Mood and Circumstances   Preoccupation:   None   Hallucinations:   None   Organization:  No data recorded  [employer] of Knowledge:   Fair   Intelligence:   Average   Abstraction:   Normal   Judgement:   Fair   Art therapist:   Realistic   Insight:   Fair   Decision Making:   Normal   Social Functioning  Social Maturity:   Isolates   Social Judgement:   Normal   Stress  Stressors:   Illness   Coping Ability:   Advice worker Deficits:   None   Supports:   Support needed     Religion: Religion/Spirituality Are You A Religious Person?: No  Leisure/Recreation: Leisure / Recreation Do You Have Hobbies?: No  Exercise/Diet: Exercise/Diet Do You Exercise?: No Have You Gained or Lost A Significant Amount of Weight in the Past Six Months?: No Do You Follow a Special Diet?: No Do You Have Any Trouble Sleeping?: No           DSM5 Diagnoses: Patient Active Problem List   Diagnosis Date Noted   Opiate withdrawal ([place]) 11/28/2021   Major depressive disorder, recurrent severe without psychotic features ([place]) 11/26/2021   Polysubstance (including  opioids) dependence, daily use ([place]) 11/26/2021   Polysubstance abuse ([place]) 04/18/2020   Prostate cancer ([place]) 02/20/2019    Patient Centered Plan: Patient is on the following Treatment Plan(s):  Depression   Referrals to Alternative Service(s): Referred to Alternative Service(s):   Place:   Date:   Time:    Referred to Alternative Service(s):   Place:   Date:   Time:    Referred to Alternative Service(s):   Place:   Date:   Time:    Referred to Alternative Service(s):   Place:   Date:   Time:      '@BHCOLLABOFCARE'$ @  Laretta Alstrom

## 2022-04-30 ENCOUNTER — Encounter: Payer: Self-pay | Admitting: Urology

## 2022-05-22 ENCOUNTER — Emergency Department: Payer: Medicare Other

## 2022-05-22 ENCOUNTER — Emergency Department
Admission: EM | Admit: 2022-05-22 | Discharge: 2022-05-22 | Disposition: A | Discharge status: Home / Self Care | Payer: Medicare Other | Attending: Student in an Organized Health Care Education/Training Program | Admitting: Student in an Organized Health Care Education/Training Program

## 2022-05-22 ENCOUNTER — Other Ambulatory Visit: Payer: Self-pay

## 2022-05-22 DIAGNOSIS — W01198A Fall on same level from slipping, tripping and stumbling with subsequent striking against other object, initial encounter: Secondary | ICD-10-CM | POA: Diagnosis not present

## 2022-05-22 DIAGNOSIS — R42 Dizziness and giddiness: Secondary | ICD-10-CM | POA: Insufficient documentation

## 2022-05-22 DIAGNOSIS — M25552 Pain in left hip: Secondary | ICD-10-CM

## 2022-05-22 DIAGNOSIS — S0990XA Unspecified injury of head, initial encounter: Secondary | ICD-10-CM | POA: Diagnosis present

## 2022-05-22 DIAGNOSIS — W19XXXA Unspecified fall, initial encounter: Secondary | ICD-10-CM

## 2022-05-22 DIAGNOSIS — R296 Repeated falls: Secondary | ICD-10-CM | POA: Insufficient documentation

## 2022-05-22 DIAGNOSIS — F1910 Other psychoactive substance abuse, uncomplicated: Secondary | ICD-10-CM | POA: Diagnosis not present

## 2022-05-22 LAB — COMPREHENSIVE METABOLIC PANEL
ALT: 44 U/L (ref 0–44)
AST: 83 U/L — ABNORMAL HIGH (ref 15–41)
Albumin: 3.5 g/dL (ref 3.5–5.0)
Alkaline Phosphatase: 54 U/L (ref 38–126)
Anion gap: 3 — ABNORMAL LOW (ref 5–15)
BUN: 26 mg/dL — ABNORMAL HIGH (ref 8–23)
CO2: 28 mmol/L (ref 22–32)
Calcium: 9 mg/dL (ref 8.9–10.3)
Chloride: 109 mmol/L (ref 98–111)
Creatinine, Ser: 1.18 mg/dL (ref 0.61–1.24)
GFR, Estimated: >60 mL/min (ref >60)
Glucose, Bld: 81 mg/dL (ref 70–99)
Potassium: 3.8 mmol/L (ref 3.5–5.1)
Sodium: 140 mmol/L (ref 135–145)
Total Bilirubin: 1 mg/dL (ref 0.3–1.2)
Total Protein: 7.2 g/dL (ref 6.5–8.1)

## 2022-05-22 LAB — CBC WITH DIFFERENTIAL/PLATELET
Abs Immature Granulocytes: 0.01 10*3/uL (ref 0.00–0.07)
Basophils Absolute: 0 10*3/uL (ref 0.0–0.1)
Basophils Relative: 1 %
Eosinophils Absolute: 0.2 10*3/uL (ref 0.0–0.5)
Eosinophils Relative: 4 %
HCT: 34.6 % — ABNORMAL LOW (ref 39.0–52.0)
Hemoglobin: 12.3 g/dL — ABNORMAL LOW (ref 13.0–17.0)
Immature Granulocytes: 0 %
Lymphocytes Relative: 26 %
Lymphs Abs: 1.4 10*3/uL (ref 0.7–4.0)
MCH: 29.1 pg (ref 26.0–34.0)
MCHC: 35.5 g/dL (ref 30.0–36.0)
MCV: 81.8 fL (ref 80.0–100.0)
Monocytes Absolute: 0.7 10*3/uL (ref 0.1–1.0)
Monocytes Relative: 13 %
Neutro Abs: 3.1 10*3/uL (ref 1.7–7.7)
Neutrophils Relative %: 56 %
Platelets: 133 10*3/uL — ABNORMAL LOW (ref 150–400)
RBC: 4.23 MIL/uL (ref 4.22–5.81)
RDW: 14.9 % (ref 11.5–15.5)
WBC: 5.5 10*3/uL (ref 4.0–10.5)
nRBC: 0 % (ref 0.0–0.2)

## 2022-05-22 LAB — URINALYSIS, ROUTINE W REFLEX MICROSCOPIC
Bacteria, UA: NONE SEEN
Bilirubin Urine: NEGATIVE
Glucose, UA: NEGATIVE mg/dL
Hgb urine dipstick: NEGATIVE
Ketones, ur: NEGATIVE mg/dL
Leukocytes,Ua: NEGATIVE
Nitrite: NEGATIVE
Protein, ur: 30 mg/dL — AB
Specific Gravity, Urine: 1.026 (ref 1.005–1.030)
pH: 5 (ref 5.0–8.0)

## 2022-05-22 LAB — TROPONIN I (HIGH SENSITIVITY)
Troponin I (High Sensitivity): 40 ng/L — ABNORMAL HIGH (ref <18)
Troponin I (High Sensitivity): 31 ng/L — ABNORMAL HIGH (ref <18)

## 2022-05-22 LAB — URINE DRUG SCREEN, QUALITATIVE (ARMC ONLY)
Amphetamines, Ur Screen: NOT DETECTED
Barbiturates, Ur Screen: NOT DETECTED
Benzodiazepine, Ur Scrn: NOT DETECTED
Cannabinoid 50 Ng, Ur ~~LOC~~: POSITIVE — AB
Cocaine Metabolite,Ur ~~LOC~~: POSITIVE — AB
MDMA (Ecstasy)Ur Screen: NOT DETECTED
Methadone Scn, Ur: NOT DETECTED
Opiate, Ur Screen: NOT DETECTED
Phencyclidine (PCP) Ur S: NOT DETECTED
Tricyclic, Ur Screen: NOT DETECTED

## 2022-05-22 MED ORDER — ACETAMINOPHEN 325 MG PO TABS
650.0000 mg | ORAL_TABLET | Freq: Once | ORAL | Status: AC
  Administered 2022-05-22: 650 mg via ORAL
  Filled 2022-05-22 (×2): qty 2

## 2022-05-22 MED ORDER — LIDOCAINE 5 % EX PTCH
1.0000 | MEDICATED_PATCH | CUTANEOUS | Status: DC
  Administered 2022-05-22: 1 via TRANSDERMAL
  Filled 2022-05-22 (×2): qty 1

## 2022-05-22 NOTE — ED Notes (Signed)
Repeat trop sent

## 2022-05-22 NOTE — ED Triage Notes (Signed)
Pt states he frequently falls, pt states he gets dizzy and he can feel it coming and then he falls. Pt states he has fallen 4 times in the past month. Pt states he fell yesterday and hit face, backside, left hip. Pt walking in lobby.

## 2022-05-22 NOTE — ED Notes (Signed)
See triage note. Pt to ED for frequent falls, last fall was yesterday. Imaging orders already placed. Pt usually feels dizzy before he falls. Pt was ambulatory in lobby. Warm blankets provided to pt.

## 2022-05-22 NOTE — ED Notes (Signed)
Pt was asking for food. Other RN offered crackers, pt stated he would go to cafeteria for food then come back later. RN explained to pt that this is not possible.

## 2022-05-22 NOTE — ED Notes (Signed)
This rn entered room to find patient stading in room. This Rn assisted pt back to bed, placed posey alarm on patient. Pt assisted to toilet via wheelchair and assisted to use toilet. Pt informed to pull assist string when finished with bathroom

## 2022-05-22 NOTE — ED Provider Notes (Signed)
[facility] Provider Note    Event Date/Time   First MD Initiated Contact with Patient 05/22/22 1225     (approximate)   History   Fall   HPI  Alexander Avery is a 66 y.o. male history of polysubstance abuse presents to the ER for evaluation of frequent falls.  States that sometimes he will get dizzy and fall.  Is happened several times over the past month.  Had a fall yesterday fell on his left hip and head.  Denies any chest pain or pressure no nausea or vomiting.     Physical Exam   Triage Vital Signs: ED Triage Vitals  Enc Vitals Group     BP 05/22/22 1217 (!) 152/90     Pulse Rate 05/22/22 1217 61     Resp 05/22/22 1217 16     Temp 05/22/22 1217 98 F (36.7 C)     Temp Source 05/22/22 1217 Oral     SpO2 05/22/22 1217 98 %     Weight 05/22/22 1218 109 lb (49.4 kg)     Height 05/22/22 1218 '5\' 6"'$  (1.676 m)     Head Circumference --      Peak Flow --      Pain Score 05/22/22 1217 9     Pain Loc --      Pain Edu? --      Excl. in [place]? --     Most recent vital signs: Vitals:   05/22/22 1525 05/22/22 1526  BP: (!) 166/82   Pulse: (!) 52 (!) 52  Resp: 12   Temp:    SpO2: 98% 98%     Constitutional: Alert  Eyes: Conjunctivae are normal.  Head: Atraumatic. Nose: No congestion/rhinnorhea. Mouth/Throat: Mucous membranes are moist.   Neck: Painless ROM.  Cardiovascular:   Good peripheral circulation. Respiratory: Normal respiratory effort.  No retractions.  Gastrointestinal: Soft and nontender.  Musculoskeletal:  no deformity, no pain with logroll.  Compartments soft.  No evidence of external trauma.  Left foot with some skin breakdown between the toes but no cellulitis no purulence no abscess Neurologic:  MAE spontaneously. No gross focal neurologic deficits are appreciated.  Skin:  Skin is warm, dry and intact. No rash noted. Psychiatric: Mood and affect are normal. Speech and behavior are normal.    ED Results / Procedures /  Treatments   Labs (all labs ordered are listed, but only abnormal results are displayed) Labs Reviewed  CBC WITH DIFFERENTIAL/PLATELET - Abnormal; Notable for the following components:      Result Value   Hemoglobin 12.3 (*)    HCT 34.6 (*)    Platelets 133 (*)    All other components within normal limits  COMPREHENSIVE METABOLIC PANEL - Abnormal; Notable for the following components:   BUN 26 (*)    AST 83 (*)    Anion gap 3 (*)    All other components within normal limits  URINALYSIS, ROUTINE W REFLEX MICROSCOPIC - Abnormal; Notable for the following components:   Color, Urine YELLOW (*)    APPearance HAZY (*)    Protein, ur 30 (*)    All other components within normal limits  URINE DRUG SCREEN, QUALITATIVE ([facility] ONLY) - Abnormal; Notable for the following components:   Cocaine Metabolite,Ur [place] POSITIVE (*)    Cannabinoid 50 Ng, Ur [place] POSITIVE (*)    All other components within normal limits  TROPONIN I (HIGH SENSITIVITY) - Abnormal; Notable for the following components:  Troponin I (High Sensitivity) 40 (*)    All other components within normal limits  TROPONIN I (HIGH SENSITIVITY) - Abnormal; Notable for the following components:   Troponin I (High Sensitivity) 31 (*)    All other components within normal limits     EKG  ED ECG REPORT I, Merlyn Lot, the attending physician, personally viewed and interpreted this ECG.   Date: 05/22/2022  EKG Time: 14:35  Rate: 50  Rhythm: sinus  Axis: normal  Intervals:normal qt  ST&T Change: inferolateral t wave inv new from prior. No stemi or depressions    RADIOLOGY Please see ED Course for my review and interpretation.  I personally reviewed all radiographic images ordered to evaluate for the above acute complaints and reviewed radiology reports and findings.  These findings were personally discussed with the patient.  Please see medical record for radiology report.    PROCEDURES:  Critical Care performed:  No  Procedures   MEDICATIONS ORDERED IN ED: Medications  lidocaine (LIDODERM) 5 % 1 patch (1 patch Transdermal Patch Applied 05/22/22 1623)  acetaminophen (TYLENOL) tablet 650 mg (650 mg Oral Given 05/22/22 1359)     IMPRESSION / MDM / ASSESSMENT AND PLAN / ED COURSE  I reviewed the triage vital signs and the nursing notes.                              Differential diagnosis includes, but is not limited to, dehydration, electrolyte abnormality, substance abuse, dysrhythmia, ACS, SDH, IPH, fracture  Patient presented to the ER for evaluation of symptoms as described above.  Presentation complicated by his age respecters history of substance abuse.  This presenting complaint could reflect a potentially life-threatening illness therefore the patient will be placed on continuous pulse oximetry and telemetry for monitoring.  Laboratory evaluation will be sent to evaluate for the above complaints.  Radiographs and ct imaging will be ordered for the above differential.   Clinical Course as of 05/22/22 1625  Sat May 22, 2022  1310 X-ray on my review and interpretation does not show any evidence of fracture or dislocation. [PR]  1426 Troponin mildly elevated.  Patient is cocaine positive denying any chest pain.  Have lower suspicion for ACS, will repeat. [PR]  1612 Repeat troponin not rising.  I suspect this elevation is likely secondary to recent cocaine and substance use not consistent with ACS.  He is denying any chest pain.  Awaiting CT imaging for report. [PR]  4098 Patient reassessed.  Has been chest pain-free.  Does admit to using cocaine in the past 48 hours.  He is asking for pain patch for his left hip.  He is able to ambulate.  Also requesting referral to podiatry regarding pain between his toes.  Neurovascular intact distally no sign of cellulitis or infection.  Does appear stable and appropriate for outpatient follow-up. [PR]    Clinical Course User Index [PR] Merlyn Lot, MD      FINAL CLINICAL IMPRESSION(S) / ED DIAGNOSES   Final diagnoses:  Pain of left hip  Fall, initial encounter  Substance abuse [facility])     Rx / DC Orders   ED Discharge Orders     None        Note:  This document was prepared using Dragon voice recognition software and may include unintentional dictation errors.    Merlyn Lot, MD 05/22/22 [phone]

## 2022-05-22 NOTE — ED Notes (Signed)
Pt states he will get a cab and gives verbal consent to DC

## 2022-05-30 ENCOUNTER — Emergency Department: Payer: Medicare Other

## 2022-05-30 ENCOUNTER — Other Ambulatory Visit: Payer: Self-pay

## 2022-05-30 ENCOUNTER — Emergency Department
Admission: EM | Admit: 2022-05-30 | Discharge: 2022-05-30 | Disposition: A | Discharge status: Home / Self Care | Payer: Medicare Other | Attending: Emergency Medicine | Admitting: Emergency Medicine

## 2022-05-30 DIAGNOSIS — R600 Localized edema: Secondary | ICD-10-CM | POA: Insufficient documentation

## 2022-05-30 DIAGNOSIS — R7989 Other specified abnormal findings of blood chemistry: Secondary | ICD-10-CM | POA: Insufficient documentation

## 2022-05-30 DIAGNOSIS — E119 Type 2 diabetes mellitus without complications: Secondary | ICD-10-CM | POA: Insufficient documentation

## 2022-05-30 DIAGNOSIS — I10 Essential (primary) hypertension: Secondary | ICD-10-CM | POA: Insufficient documentation

## 2022-05-30 DIAGNOSIS — M7989 Other specified soft tissue disorders: Secondary | ICD-10-CM | POA: Diagnosis present

## 2022-05-30 DIAGNOSIS — R609 Edema, unspecified: Secondary | ICD-10-CM

## 2022-05-30 LAB — CBC WITH DIFFERENTIAL/PLATELET
Abs Immature Granulocytes: 0.01 10*3/uL (ref 0.00–0.07)
Basophils Absolute: 0 10*3/uL (ref 0.0–0.1)
Basophils Relative: 1 %
Eosinophils Absolute: 0.1 10*3/uL (ref 0.0–0.5)
Eosinophils Relative: 2 %
HCT: 34.4 % — ABNORMAL LOW (ref 39.0–52.0)
Hemoglobin: 12 g/dL — ABNORMAL LOW (ref 13.0–17.0)
Immature Granulocytes: 0 %
Lymphocytes Relative: 14 %
Lymphs Abs: 0.9 10*3/uL (ref 0.7–4.0)
MCH: 28.8 pg (ref 26.0–34.0)
MCHC: 34.9 g/dL (ref 30.0–36.0)
MCV: 82.7 fL (ref 80.0–100.0)
Monocytes Absolute: 0.4 10*3/uL (ref 0.1–1.0)
Monocytes Relative: 6 %
Neutro Abs: 4.7 10*3/uL (ref 1.7–7.7)
Neutrophils Relative %: 77 %
Platelets: 160 10*3/uL (ref 150–400)
RBC: 4.16 MIL/uL — ABNORMAL LOW (ref 4.22–5.81)
RDW: 14.9 % (ref 11.5–15.5)
WBC: 6.1 10*3/uL (ref 4.0–10.5)
nRBC: 0 % (ref 0.0–0.2)

## 2022-05-30 LAB — COMPREHENSIVE METABOLIC PANEL
ALT: 29 U/L (ref 0–44)
AST: 37 U/L (ref 15–41)
Albumin: 3.3 g/dL — ABNORMAL LOW (ref 3.5–5.0)
Alkaline Phosphatase: 56 U/L (ref 38–126)
Anion gap: 4 — ABNORMAL LOW (ref 5–15)
BUN: 23 mg/dL (ref 8–23)
CO2: 30 mmol/L (ref 22–32)
Calcium: 9 mg/dL (ref 8.9–10.3)
Chloride: 107 mmol/L (ref 98–111)
Creatinine, Ser: 1.03 mg/dL (ref 0.61–1.24)
GFR, Estimated: >60 mL/min (ref >60)
Glucose, Bld: 91 mg/dL (ref 70–99)
Potassium: 3.7 mmol/L (ref 3.5–5.1)
Sodium: 141 mmol/L (ref 135–145)
Total Bilirubin: 0.6 mg/dL (ref 0.3–1.2)
Total Protein: 6.7 g/dL (ref 6.5–8.1)

## 2022-05-30 LAB — BRAIN NATRIURETIC PEPTIDE: B Natriuretic Peptide: 236.6 pg/mL — ABNORMAL HIGH (ref 0.0–100.0)

## 2022-05-30 LAB — LIPASE, BLOOD: Lipase: 38 U/L (ref 11–51)

## 2022-05-30 MED ORDER — FUROSEMIDE 20 MG PO TABS: 20.0000 mg | ORAL_TABLET | Freq: Every day | ORAL | 11 refills | Status: DC

## 2022-05-30 MED ORDER — FUROSEMIDE 40 MG PO TABS
40.0000 mg | ORAL_TABLET | Freq: Once | ORAL | Status: AC
  Administered 2022-05-30: 40 mg via ORAL
  Filled 2022-05-30: qty 1

## 2022-05-30 MED ORDER — POTASSIUM CHLORIDE CRYS ER 20 MEQ PO TBCR: 20.0000 meq | EXTENDED_RELEASE_TABLET | Freq: Once | ORAL | 0 refills | Status: DC

## 2022-05-30 NOTE — Discharge Instructions (Addendum)
You can take 20 mg of Lasix once daily for 3 days.  While you are taking your Lasix, please take 20 mEq of potassium chloride to keep your potassium levels from dropping. If you continue to have leg swelling, you can continue Lasix for 4 more days.  You will take 20 mg daily for 4 more days.  If your swelling has resolved, you do not have to continue Lasix after day 3.

## 2022-05-30 NOTE — ED Provider Notes (Signed)
[facility] Provider Note  Patient Contact: 4:17 PM (approximate)   History   Leg Swelling   HPI  Alexander Avery is a 66 y.o. male with a history of hypertension, GERD, diabetes, seizures and anxiety presents to the emergency department with bilateral lower extremity swelling.  Patient was evaluated for similar symptoms on 05/22/2022.  He states that the swelling in his legs has worsened.  Patient denies recent weight gain, chest pain, chest tightness or shortness of breath.  No nausea, vomiting or abdominal pain.      Physical Exam   Triage Vital Signs: ED Triage Vitals  Enc Vitals Group     BP 05/30/22 1356 (!) 162/75     Pulse Rate 05/30/22 1356 61     Resp 05/30/22 1356 19     Temp 05/30/22 1356 98 F (36.7 C)     Temp src --      SpO2 05/30/22 1356 100 %     Weight --      Height --      Head Circumference --      Peak Flow --      Pain Score 05/30/22 1333 8     Pain Loc --      Pain Edu? --      Excl. in [place]? --     Most recent vital signs: Vitals:   05/30/22 1356 05/30/22 1856  BP: (!) 162/75 (!) 174/77  Pulse: 61 (!) 53  Resp: 19 16  Temp: 98 F (36.7 C)   SpO2: 100% 98%     General: Alert and in no acute distress. Eyes:  PERRL. EOMI. Head: No acute traumatic findings ENT:      Nose: No congestion/rhinnorhea.      Mouth/Throat: Mucous membranes are moist. Neck: No stridor. No cervical spine tenderness to palpation. Cardiovascular:  Good peripheral perfusion Respiratory: Normal respiratory effort without tachypnea or retractions. Lungs CTAB. Good air entry to the bases with no decreased or absent breath sounds. Gastrointestinal: Bowel sounds 4 quadrants. Soft and nontender to palpation. No guarding or rigidity. No palpable masses. No distention. No CVA tenderness. Musculoskeletal: Full range of motion to all extremities.  Neurologic:  No gross focal neurologic deficits are appreciated.  Skin: Patient has 2+ pitting edema  visualized bilaterally.    ED Results / Procedures / Treatments   Labs (all labs ordered are listed, but only abnormal results are displayed) Labs Reviewed  CBC WITH DIFFERENTIAL/PLATELET - Abnormal; Notable for the following components:      Result Value   RBC 4.16 (*)    Hemoglobin 12.0 (*)    HCT 34.4 (*)    All other components within normal limits  COMPREHENSIVE METABOLIC PANEL - Abnormal; Notable for the following components:   Albumin 3.3 (*)    Anion gap 4 (*)    All other components within normal limits  BRAIN NATRIURETIC PEPTIDE - Abnormal; Notable for the following components:   B Natriuretic Peptide 236.6 (*)    All other components within normal limits  LIPASE, BLOOD  URINALYSIS, ROUTINE W REFLEX MICROSCOPIC        RADIOLOGY  I personally viewed and evaluated these images as part of my medical decision making, as well as reviewing the written report by the radiologist.  ED Provider Interpretation: I personally reviewed venous ultrasound and there was no evidence of DVT.   PROCEDURES:  Critical Care performed: No  Procedures   MEDICATIONS ORDERED IN ED: Medications  furosemide (LASIX) tablet 40 mg (has no administration in time range)     IMPRESSION / MDM / ASSESSMENT AND PLAN / ED COURSE  I reviewed the triage vital signs and the nursing notes.                             Assessment and plan Leg swelling 66 year old male presents to the emergency department with bilateral lower extremity swelling  Patient was hypertensive at triage but vital signs otherwise reassuring.  CBC and CMP largely unremarkable.  Lipase within range.  BNP mildly elevated.  Chest x-ray unremarkable.  Bilateral venous ultrasound showed no signs of DVT.  Patient was given 40 mg of Lasix in the emergency department and will be discharged with 20 mg of Lasix once daily.  I recommended reassessment after 3 days.  If patient continues to have swelling, will recommend  continuing Lasix for 4 more days.  Patient was also prescribed a short course of potassium chloride to take with Lasix.  Plan of care was reviewed with attending Dr. Ellender Hose who agrees with management at this time.   FINAL CLINICAL IMPRESSION(S) / ED DIAGNOSES   Final diagnoses:  Peripheral edema     Rx / DC Orders   ED Discharge Orders          Ordered    furosemide (LASIX) 20 MG tablet  Daily        05/30/22 1818    potassium chloride SA (KLOR-CON M) 20 MEQ tablet   Once        05/30/22 1818             Note:  This document was prepared using Dragon voice recognition software and may include unintentional dictation errors.   Vallarie Mare [place], PA-C 05/30/22 Lanetta Inch    Duffy Bruce, MD 06/07/22 [phone]

## 2022-05-30 NOTE — ED Triage Notes (Signed)
Pt comes via EMs with c/o bilateral leg sweling. Pt states he was seen here recently for same with no new dx. Pt states pain and swelling has gotten worse. Pt denies any SOB or CP.  Pt states constant sharp and throbbing. 8/10 pain.  165/80  Pt does have prostate cancer. Pt is from [facility].

## 2022-06-02 NOTE — Progress Notes (Deleted)
01/31/2019 12:45 PM   Alexander Avery Aug 24, 1956 [id number]  Referring provider: Danelle Berry, NP [address] [place],  [place] [address]  No chief complaint on file.  Urological history 1. Prostate cancer - PSA pending - diagnosed at [facility] - iPSA 15.0 - DRE nodularity and induration in the left lobe.  Biopsy was performed on 02/05/2015.  His prostate was 20.5 grams PSA density of 0.73.   Gleason score 6 and 7 (3+4) involving all 6 biopsies zones - RRP on 04/21/2015 -pathology showed a Gleason score 7 involving 25% of the gland with negative surgical margins - ADT 04/2019 - Completed salvage radiation therapy 07/2019   2. Urethral stricture - underwent urethral dilation for a stricture at the vesicourethral anastomosis on November 2016 with Leander Rams sounds - cytoscopy with Dr. Bernardo Heater on 09/15/2018, no evidence of urethral stricture or bladder neck contracture  3. LU TS -Contributing factors of age, prostate cancer, pelvic radiation, pelvic surgery, cocaine use, hypertension, sleep apnea, diabetes, arthritis, depression, anxiety and diuretics - I PSS *** - PVR *** mL  4. ED - contributing factors of age, pelvic radiation, prostate cancer, DM, HTN, HLD, hypothyroidism, sleep apnea, stress, anxiety, alcohol abuse and smoking - SHIM *** - sildenafil 100 mg on-demand-dosing   HPI: Alexander Avery is a 66 y.o. male who presents today for follow up.         Score: 1-7 Severe ED 8-11 Moderate ED 12-16 Mild-Moderate ED 17-21 Mild ED 22-25 No ED  PMH: Past Medical History:  Diagnosis Date   Anxiety    Arthritis    Cancer ([place])    prostate   Cataract 2019   Diabetes mellitus without complication (HCC)    GERD (gastroesophageal reflux disease)    Hernia of abdominal wall 2015   History of stomach ulcers    Hypertension    Prostate cancer ([place])    Seizures ([place])    Sleep apnea     Surgical History: Past Surgical History:  Procedure  Laterality Date   CATARACT EXTRACTION W/PHACO Right 03/30/2018   Procedure: CATARACT EXTRACTION PHACO AND INTRAOCULAR LENS PLACEMENT ([place]);  Surgeon: Eulogio Bear, MD;  Location: [facility] ORS;  Service: Ophthalmology;  Laterality: Right;  [place] 01:09.5 AP% 10.4 CDE 7.98 Fluid pack lot # [id number]   CATARACT EXTRACTION W/PHACO Left 06/29/2018   Procedure: CATARACT EXTRACTION PHACO AND INTRAOCULAR LENS PLACEMENT (I[facility]);  Surgeon: Eulogio Bear, MD;  Location: [facility] ORS;  Service: Ophthalmology;  Laterality: Left;  [place] 01:27.7 AP% 8.2 CDE 7.27 Fluid pack lot # [id number]   EYE SURGERY     HERNIA REPAIR     PROSTATE SURGERY     PROSTATE SURGERY  2016    Home Medications:  Allergies as of 06/03/2022       Reactions   Penicillins Hives, Other (See Comments)   Urinate on self, pass out Has patient had a PCN reaction causing immediate rash, facial/tongue/throat swelling, SOB or lightheadedness with hypotension: Yes Has patient had a PCN reaction causing severe rash involving mucus membranes or skin necrosis: No Has patient had a PCN reaction that required hospitalization: Yes- In hospital Has patient had a PCN reaction occurring within the last 10 years: Yes If all of the above answers are "NO", then may proceed with Cephalosporin use. Other reaction(s): Other (See Comments) Urinate on self, pass out Has patient had a PCN reaction causing immediate rash, facial/tongue/throat swelling, SOB or lightheadedness with hypotension: Yes Has patient had a PCN  reaction causing severe rash involving mucus membranes or skin necrosis: No Has patient had a PCN reaction that required hospitalization: Yes- In hospital Has patient had a PCN reaction occurring within the last 10 years: Yes If all of the above answers are "NO", then may proceed with Cephalosporin use. "I pass out"        Medication List        Accurate as of June 02, 2022 12:45 PM. If you have any questions, ask your nurse or doctor.           cloNIDine 0.1 MG tablet Commonly known as: CATAPRES Take 0.1 mg by mouth 2 (two) times daily as needed.   furosemide 20 MG tablet Commonly known as: Lasix Take 1 tablet (20 mg total) by mouth daily for 7 days.   potassium chloride SA 20 MEQ tablet Commonly known as: KLOR-CON M Take 1 tablet (20 mEq total) by mouth once for 1 dose.   Suboxone 8-2 MG Film Generic drug: Buprenorphine HCl-Naloxone HCl Place 1 strip under the tongue daily.        Allergies:  Allergies  Allergen Reactions   Penicillins Hives and Other (See Comments)    Urinate on self, pass out Has patient had a PCN reaction causing immediate rash, facial/tongue/throat swelling, SOB or lightheadedness with hypotension: Yes Has patient had a PCN reaction causing severe rash involving mucus membranes or skin necrosis: No Has patient had a PCN reaction that required hospitalization: Yes- In hospital Has patient had a PCN reaction occurring within the last 10 years: Yes If all of the above answers are "NO", then may proceed with Cephalosporin use.  Other reaction(s): Other (See Comments) Urinate on self, pass out Has patient had a PCN reaction causing immediate rash, facial/tongue/throat swelling, SOB or lightheadedness with hypotension: Yes Has patient had a PCN reaction causing severe rash involving mucus membranes or skin necrosis: No Has patient had a PCN reaction that required hospitalization: Yes- In hospital Has patient had a PCN reaction occurring within the last 10 years: Yes If all of the above answers are "NO", then may proceed with Cephalosporin use. "I pass out"     Family History: Family History  Problem Relation Age of Onset   Cancer Mother     Social History:  reports that he has been smoking cigarettes. He has been smoking an average of 2 packs per day. He has never used smokeless tobacco. He reports current alcohol use of about 2.0 standard drinks of alcohol per week. He reports  that he does not use drugs.  ROS: For pertinent review of systems please refer to history of present illness  Physical Exam: There were no vitals taken for this visit.  Constitutional:  Well nourished. Alert and oriented, No acute distress. HEENT: [place] AT, moist mucus membranes.  Trachea midline Cardiovascular: No clubbing, cyanosis, or edema. Respiratory: Normal respiratory effort, no increased work of breathing. GU: No CVA tenderness.  No bladder fullness or masses.  Patient with circumcised/uncircumcised phallus. ***Foreskin easily retracted***  Urethral meatus is patent.  No penile discharge. No penile lesions or rashes. Scrotum without lesions, cysts, rashes and/or edema.  Testicles are located scrotally bilaterally. No masses are appreciated in the testicles. Left and right epididymis are normal. Rectal: Patient with  normal sphincter tone. Anus and perineum without scarring or rashes. No rectal masses are appreciated. Prostate is approximately *** grams, *** nodules are appreciated. Seminal vesicles are normal. Neurologic: Grossly intact, no focal deficits, moving all 4 extremities.  Psychiatric: Normal mood and affect.   Laboratory Data:    Latest Ref Rng & Units 05/30/2022    4:17 PM 05/22/2022    1:19 PM 04/18/2022   12:03 AM  CMP  Glucose 70 - 99 mg/dL 91  81  81   BUN 8 - 23 mg/dL '23  26  19   '$ Creatinine 0.61 - 1.24 mg/dL 1.03  1.18  1.11   Sodium 135 - 145 mmol/L 141  140  142   Potassium 3.5 - 5.1 mmol/L 3.7  3.8  3.6   Chloride 98 - 111 mmol/L 107  109  109   CO2 22 - 32 mmol/L '30  28  30   '$ Calcium 8.9 - 10.3 mg/dL 9.0  9.0  8.8   Total Protein 6.5 - 8.1 g/dL 6.7  7.2  7.6   Total Bilirubin 0.3 - 1.2 mg/dL 0.6  1.0  0.9   Alkaline Phos 38 - 126 U/L 56  54  60   AST 15 - 41 U/L 37  83  32   ALT 0 - 44 U/L 29  44  22        Latest Ref Rng & Units 05/30/2022    4:17 PM 05/22/2022    1:19 PM 04/18/2022   12:03 AM  CBC  WBC 4.0 - 10.5 K/uL 6.1  5.5  4.3   Hemoglobin 13.0 -  17.0 g/dL 12.0  12.3  12.7   Hematocrit 39.0 - 52.0 % 34.4  34.6  35.8   Platelets 150 - 400 K/uL 160  133  162     Component     Latest Ref Rng 05/22/2022  Specific Gravity, UA     1.005 - 1.030    pH, UA     5.0 - 7.5    Color, UA     Yellow    Appearance Ur     Clear    Leukocytes,UA     Negative    Protein,UA     Negative/Trace    Glucose, UA     NEGATIVE mg/dL NEGATIVE   Ketones, UA     Negative    RBC, UA     Negative    Bilirubin, UA     Negative    Urobilinogen, Ur     0.2 - 1.0 mg/dL   Nitrite, UA     Negative    Color, Urine     YELLOW  YELLOW !   Appearance     CLEAR  HAZY !   Specific Gravity, Urine     1.005 - 1.030  1.026   pH     5.0 - 8.0  5.0   Hgb urine dipstick     NEGATIVE  NEGATIVE   Bilirubin Urine     NEGATIVE  NEGATIVE   Ketones, ur     NEGATIVE mg/dL NEGATIVE   Protein     NEGATIVE mg/dL 30 !   Nitrite     NEGATIVE  NEGATIVE   Leukocytes,Ua     NEGATIVE  NEGATIVE   WBC, UA     0 - 5 WBC/hpf 0-5   Bacteria, UA     NONE SEEN  NONE SEEN   Squamous Epithelial / LPF     0 - 5  0-5   RBC / HPF     0 - 5 RBC/hpf 0-5   Mucus PRESENT   Hyaline Casts, UA PRESENT   Microscopic Examination   RBC, Urine  0 - 2 /hpf   Epithelial Cells (non renal)     0 - 10 /hpf   Mucus, UA     Not Estab.      Legend: ! Abnormal I have reviewed the labs.  Pertinent Imaging N/A     Assessment & Plan:    1. Prostate cancer PSA pending RTC in 6 months for PSA  2. LU TS IPSS score is 26/4, it is worsening Continue conservative management, avoiding bladder irritants and timed voiding's Most bothersome symptoms is/are urgency and urge incontinence UA grossly infected, will send for culture and prescribe antibiotic once culture results are available  3. Erectile dysfunction SHIM score is 16, it is improved Continue sildenafil 100 mg on demand dosing Patient to return in 6 months for SH IM and exam  No follow-ups on file.  [name], PA-C   [facility] [address]  [place], [place] [address] [phone]  I spent 40 minutes on the day of the encounter to include pre-visit record review, face-to-face time with the patient, and post-visit ordering of tests.  - attempting to contact PCP's office to see if they have placed a GI referral - explaining to patient the seriousness of the rectal bleeding

## 2022-06-03 ENCOUNTER — Ambulatory Visit: Payer: Medicare Other | Admitting: Urology

## 2022-06-03 DIAGNOSIS — R399 Unspecified symptoms and signs involving the genitourinary system: Secondary | ICD-10-CM

## 2022-06-03 DIAGNOSIS — N529 Male erectile dysfunction, unspecified: Secondary | ICD-10-CM

## 2022-06-03 DIAGNOSIS — C61 Malignant neoplasm of prostate: Secondary | ICD-10-CM

## 2022-06-03 NOTE — Progress Notes (Incomplete)
01/31/2019 7:01 AM   Alexander Avery 1956/03/01 [id number]  Referring provider: Danelle Berry, NP [address] [place],  [place] [address]  No chief complaint on file.  Urological history 1. Prostate cancer - PSA pending - diagnosed at [facility] - iPSA 15.0 - DRE nodularity and induration in the left lobe.  Biopsy was performed on 02/05/2015.  His prostate was 20.5 grams PSA density of 0.73.   Gleason score 6 and 7 (3+4) involving all 6 biopsies zones - RRP on 04/21/2015 -pathology showed a Gleason score 7 involving 25% of the gland with negative surgical margins - ADT 04/2019 - Completed salvage radiation therapy 07/2019   2. Urethral stricture - underwent urethral dilation for a stricture at the vesicourethral anastomosis on November 2016 with Leander Rams sounds - cytoscopy with Dr. Bernardo Heater on 09/15/2018, no evidence of urethral stricture or bladder neck contracture  3. LU TS -Contributing factors of age, prostate cancer, pelvic radiation, pelvic surgery, cocaine use, hypertension, sleep apnea, diabetes, arthritis, depression, anxiety and diuretics - I PSS *** - PVR *** mL  4. ED - contributing factors of age, pelvic radiation, prostate cancer, DM, HTN, HLD, hypothyroidism, sleep apnea, stress, anxiety, alcohol abuse and smoking - SHIM *** - sildenafil 100 mg on-demand-dosing   HPI: Alexander Avery is a 66 y.o. male who presents today for follow up.         Score: 1-7 Severe ED 8-11 Moderate ED 12-16 Mild-Moderate ED 17-21 Mild ED 22-25 No ED  PMH: Past Medical History:  Diagnosis Date   Anxiety    Arthritis    Cancer ([place])    prostate   Cataract 2019   Diabetes mellitus without complication (HCC)    GERD (gastroesophageal reflux disease)    Hernia of abdominal wall 2015   History of stomach ulcers    Hypertension    Prostate cancer ([place])    Seizures ([place])    Sleep apnea     Surgical History: Past Surgical History:  Procedure  Laterality Date   CATARACT EXTRACTION W/PHACO Right 03/30/2018   Procedure: CATARACT EXTRACTION PHACO AND INTRAOCULAR LENS PLACEMENT ([place]);  Surgeon: Eulogio Bear, MD;  Location: [facility] ORS;  Service: Ophthalmology;  Laterality: Right;  [place] 01:09.5 AP% 10.4 CDE 7.98 Fluid pack lot # [id number]   CATARACT EXTRACTION W/PHACO Left 06/29/2018   Procedure: CATARACT EXTRACTION PHACO AND INTRAOCULAR LENS PLACEMENT (IOC);  Surgeon: Eulogio Bear, MD;  Location: [facility] ORS;  Service: Ophthalmology;  Laterality: Left;  [place] 01:27.7 AP% 8.2 CDE 7.27 Fluid pack lot # [id number]   EYE SURGERY     HERNIA REPAIR     PROSTATE SURGERY     PROSTATE SURGERY  2016    Home Medications:  Allergies as of 06/03/2022       Reactions   Penicillins Hives, Other (See Comments)   Urinate on self, pass out Has patient had a PCN reaction causing immediate rash, facial/tongue/throat swelling, SOB or lightheadedness with hypotension: Yes Has patient had a PCN reaction causing severe rash involving mucus membranes or skin necrosis: No Has patient had a PCN reaction that required hospitalization: Yes- In hospital Has patient had a PCN reaction occurring within the last 10 years: Yes If all of the above answers are "NO", then may proceed with Cephalosporin use. Other reaction(s): Other (See Comments) Urinate on self, pass out Has patient had a PCN reaction causing immediate rash, facial/tongue/throat swelling, SOB or lightheadedness with hypotension: Yes Has patient had a PCN  reaction causing severe rash involving mucus membranes or skin necrosis: No Has patient had a PCN reaction that required hospitalization: Yes- In hospital Has patient had a PCN reaction occurring within the last 10 years: Yes If all of the above answers are "NO", then may proceed with Cephalosporin use. "I pass out"        Medication List        Accurate as of June 03, 2022  7:01 AM. If you have any questions, ask your nurse or doctor.           cloNIDine 0.1 MG tablet Commonly known as: CATAPRES Take 0.1 mg by mouth 2 (two) times daily as needed.   furosemide 20 MG tablet Commonly known as: Lasix Take 1 tablet (20 mg total) by mouth daily for 7 days.   potassium chloride SA 20 MEQ tablet Commonly known as: KLOR-CON M Take 1 tablet (20 mEq total) by mouth once for 1 dose.   Suboxone 8-2 MG Film Generic drug: Buprenorphine HCl-Naloxone HCl Place 1 strip under the tongue daily.        Allergies:  Allergies  Allergen Reactions   Penicillins Hives and Other (See Comments)    Urinate on self, pass out Has patient had a PCN reaction causing immediate rash, facial/tongue/throat swelling, SOB or lightheadedness with hypotension: Yes Has patient had a PCN reaction causing severe rash involving mucus membranes or skin necrosis: No Has patient had a PCN reaction that required hospitalization: Yes- In hospital Has patient had a PCN reaction occurring within the last 10 years: Yes If all of the above answers are "NO", then may proceed with Cephalosporin use.  Other reaction(s): Other (See Comments) Urinate on self, pass out Has patient had a PCN reaction causing immediate rash, facial/tongue/throat swelling, SOB or lightheadedness with hypotension: Yes Has patient had a PCN reaction causing severe rash involving mucus membranes or skin necrosis: No Has patient had a PCN reaction that required hospitalization: Yes- In hospital Has patient had a PCN reaction occurring within the last 10 years: Yes If all of the above answers are "NO", then may proceed with Cephalosporin use. "I pass out"     Family History: Family History  Problem Relation Age of Onset   Cancer Mother     Social History:  reports that he has been smoking cigarettes. He has been smoking an average of 2 packs per day. He has never used smokeless tobacco. He reports current alcohol use of about 2.0 standard drinks of alcohol per week. He reports  that he does not use drugs.  ROS: For pertinent review of systems please refer to history of present illness  Physical Exam: There were no vitals taken for this visit.  Constitutional:  Well nourished. Alert and oriented, No acute distress. HEENT: [place] AT, moist mucus membranes.  Trachea midline Cardiovascular: No clubbing, cyanosis, or edema. Respiratory: Normal respiratory effort, no increased work of breathing. GU: No CVA tenderness.  No bladder fullness or masses.  Patient with circumcised/uncircumcised phallus. ***Foreskin easily retracted***  Urethral meatus is patent.  No penile discharge. No penile lesions or rashes. Scrotum without lesions, cysts, rashes and/or edema.  Testicles are located scrotally bilaterally. No masses are appreciated in the testicles. Left and right epididymis are normal. Rectal: Patient with  normal sphincter tone. Anus and perineum without scarring or rashes. No rectal masses are appreciated. Prostate is approximately *** grams, *** nodules are appreciated. Seminal vesicles are normal. Neurologic: Grossly intact, no focal deficits, moving all 4  extremities. Psychiatric: Normal mood and affect.   Laboratory Data:    Latest Ref Rng & Units 05/30/2022    4:17 PM 05/22/2022    1:19 PM 04/18/2022   12:03 AM  CMP  Glucose 70 - 99 mg/dL 91  81  81   BUN 8 - 23 mg/dL '23  26  19   '$ Creatinine 0.61 - 1.24 mg/dL 1.03  1.18  1.11   Sodium 135 - 145 mmol/L 141  140  142   Potassium 3.5 - 5.1 mmol/L 3.7  3.8  3.6   Chloride 98 - 111 mmol/L 107  109  109   CO2 22 - 32 mmol/L '30  28  30   '$ Calcium 8.9 - 10.3 mg/dL 9.0  9.0  8.8   Total Protein 6.5 - 8.1 g/dL 6.7  7.2  7.6   Total Bilirubin 0.3 - 1.2 mg/dL 0.6  1.0  0.9   Alkaline Phos 38 - 126 U/L 56  54  60   AST 15 - 41 U/L 37  83  32   ALT 0 - 44 U/L 29  44  22        Latest Ref Rng & Units 05/30/2022    4:17 PM 05/22/2022    1:19 PM 04/18/2022   12:03 AM  CBC  WBC 4.0 - 10.5 K/uL 6.1  5.5  4.3   Hemoglobin 13.0 -  17.0 g/dL 12.0  12.3  12.7   Hematocrit 39.0 - 52.0 % 34.4  34.6  35.8   Platelets 150 - 400 K/uL 160  133  162     Component     Latest Ref Rng 05/22/2022  Specific Gravity, UA     1.005 - 1.030    pH, UA     5.0 - 7.5    Color, UA     Yellow    Appearance Ur     Clear    Leukocytes,UA     Negative    Protein,UA     Negative/Trace    Glucose, UA     NEGATIVE mg/dL NEGATIVE   Ketones, UA     Negative    RBC, UA     Negative    Bilirubin, UA     Negative    Urobilinogen, Ur     0.2 - 1.0 mg/dL   Nitrite, UA     Negative    Color, Urine     YELLOW  YELLOW !   Appearance     CLEAR  HAZY !   Specific Gravity, Urine     1.005 - 1.030  1.026   pH     5.0 - 8.0  5.0   Hgb urine dipstick     NEGATIVE  NEGATIVE   Bilirubin Urine     NEGATIVE  NEGATIVE   Ketones, ur     NEGATIVE mg/dL NEGATIVE   Protein     NEGATIVE mg/dL 30 !   Nitrite     NEGATIVE  NEGATIVE   Leukocytes,Ua     NEGATIVE  NEGATIVE   WBC, UA     0 - 5 WBC/hpf 0-5   Bacteria, UA     NONE SEEN  NONE SEEN   Squamous Epithelial / LPF     0 - 5  0-5   RBC / HPF     0 - 5 RBC/hpf 0-5   Mucus PRESENT   Hyaline Casts, UA PRESENT   Microscopic Examination   RBC, Urine  0 - 2 /hpf   Epithelial Cells (non renal)     0 - 10 /hpf   Mucus, UA     Not Estab.      Legend: ! Abnormal I have reviewed the labs.  Pertinent Imaging N/A     Assessment & Plan:    1. Prostate cancer PSA pending RTC in 6 months for PSA  2. LU TS IPSS score is 26/4, it is worsening Continue conservative management, avoiding bladder irritants and timed voiding's Most bothersome symptoms is/are urgency and urge incontinence UA grossly infected, will send for culture and prescribe antibiotic once culture results are available  3. Erectile dysfunction SHIM score is 16, it is improved Continue sildenafil 100 mg on demand dosing Patient to return in 6 months for SH IM and exam  No follow-ups on file.  [name], PA-C   [facility] [address]  [place], [place] [address] [phone]  I spent 40 minutes on the day of the encounter to include pre-visit record review, face-to-face time with the patient, and post-visit ordering of tests.  - attempting to contact PCP's office to see if they have placed a GI referral - explaining to patient the seriousness of the rectal bleeding

## 2022-06-08 NOTE — Progress Notes (Unsigned)
01/31/2019 9:31 PM   Alexander Avery 1956/06/17 [id number]  Referring provider: Danelle Berry, NP [address] [place],  [place] [address]  Chief Complaint  Patient presents with   Prostate Cancer   Urological history 1. Prostate cancer - PSA pending - diagnosed at [facility] - iPSA 15.0 - DRE nodularity and induration in the left lobe.  Biopsy was performed on 02/05/2015.  His prostate was 20.5 grams PSA density of 0.73.   Gleason score 6 and 7 (3+4) involving all 6 biopsies zones - RRP on 04/21/2015 -pathology showed a Gleason score 7 involving 25% of the gland with negative surgical margins - ADT 04/2019 - Completed salvage radiation therapy 07/2019   2. Urethral stricture - underwent urethral dilation for a stricture at the vesicourethral anastomosis on November 2016 with Leander Rams sounds - cytoscopy with Dr. Bernardo Heater on 09/15/2018, no evidence of urethral stricture or bladder neck contracture  3. LU TS -Contributing factors of age, prostate cancer, pelvic radiation, pelvic surgery, cocaine use, hypertension, sleep apnea, diabetes, arthritis, depression, anxiety and diuretics - I PSS 18/2 - PVR 1 mL  4. ED - contributing factors of age, pelvic radiation, prostate cancer, DM, HTN, HLD, hypothyroidism, sleep apnea, stress, anxiety, alcohol abuse and smoking - SHIM 7 - sildenafil 100 mg on-demand-dosing  HPI: Alexander Avery is a 66 y.o. male who presents today for follow up.    SHIM     Row Name 06/09/22 1109         SHIM: Over the last 6 months:   How do you rate your confidence that you could get and keep an erection? Low     When you had erections with sexual stimulation, how often were your erections hard enough for penetration (entering your partner)? A Few Times (much less than half the time)     During sexual intercourse, how often were you able to maintain your erection after you had penetrated (entered) your partner? Almost Never or Never      During sexual intercourse, how difficult was it to maintain your erection to completion of intercourse? Extremely Difficult     When you attempted sexual intercourse, how often was it satisfactory for you? Almost Never or Never       SHIM Total Score   SHIM 7               Score: 1-7 Severe ED 8-11 Moderate ED 12-16 Mild-Moderate ED 17-21 Mild ED 22-25 No ED  PMH: Past Medical History:  Diagnosis Date   Anxiety    Arthritis    Cancer ([place])    prostate   Cataract 2019   Diabetes mellitus without complication (HCC)    GERD (gastroesophageal reflux disease)    Hernia of abdominal wall 2015   History of stomach ulcers    Hypertension    Prostate cancer ([place])    Seizures ([place])    Sleep apnea     Surgical History: Past Surgical History:  Procedure Laterality Date   CATARACT EXTRACTION W/PHACO Right 03/30/2018   Procedure: CATARACT EXTRACTION PHACO AND INTRAOCULAR LENS PLACEMENT ([place]);  Surgeon: Eulogio Bear, MD;  Location: [facility] ORS;  Service: Ophthalmology;  Laterality: Right;  [place] 01:09.5 AP% 10.4 CDE 7.98 Fluid pack lot # [id number]   CATARACT EXTRACTION W/PHACO Left 06/29/2018   Procedure: CATARACT EXTRACTION PHACO AND INTRAOCULAR LENS PLACEMENT ([facility]);  Surgeon: Eulogio Bear, MD;  Location: [facility] ORS;  Service: Ophthalmology;  Laterality: Left;  [place] 01:27.7 AP%  8.2 CDE 7.27 Fluid pack lot # [id number]   EYE SURGERY     HERNIA REPAIR     PROSTATE SURGERY     PROSTATE SURGERY  2016    Home Medications:  Allergies as of 06/09/2022       Reactions   Penicillins Hives, Other (See Comments)   Urinate on self, pass out Has patient had a PCN reaction causing immediate rash, facial/tongue/throat swelling, SOB or lightheadedness with hypotension: Yes Has patient had a PCN reaction causing severe rash involving mucus membranes or skin necrosis: No Has patient had a PCN reaction that required hospitalization: Yes- In hospital Has patient had a PCN reaction  occurring within the last 10 years: Yes If all of the above answers are "NO", then may proceed with Cephalosporin use. Other reaction(s): Other (See Comments) Urinate on self, pass out Has patient had a PCN reaction causing immediate rash, facial/tongue/throat swelling, SOB or lightheadedness with hypotension: Yes Has patient had a PCN reaction causing severe rash involving mucus membranes or skin necrosis: No Has patient had a PCN reaction that required hospitalization: Yes- In hospital Has patient had a PCN reaction occurring within the last 10 years: Yes If all of the above answers are "NO", then may proceed with Cephalosporin use. "I pass out"        Medication List        Accurate as of June 09, 2022  9:31 PM. If you have any questions, ask your nurse or doctor.          cloNIDine 0.1 MG tablet Commonly known as: CATAPRES Take 0.1 mg by mouth 2 (two) times daily as needed.   diclofenac Sodium 1 % Gel Commonly known as: VOLTAREN SMARTSIG:4 Gram(s) Topical Twice Daily PRN   furosemide 20 MG tablet Commonly known as: Lasix Take 1 tablet (20 mg total) by mouth daily for 7 days.   potassium chloride SA 20 MEQ tablet Commonly known as: KLOR-CON M Take 1 tablet (20 mEq total) by mouth once for 1 dose.   Suboxone 8-2 MG Film Generic drug: Buprenorphine HCl-Naloxone HCl Place 1 strip under the tongue daily.        Allergies:  Allergies  Allergen Reactions   Penicillins Hives and Other (See Comments)    Urinate on self, pass out Has patient had a PCN reaction causing immediate rash, facial/tongue/throat swelling, SOB or lightheadedness with hypotension: Yes Has patient had a PCN reaction causing severe rash involving mucus membranes or skin necrosis: No Has patient had a PCN reaction that required hospitalization: Yes- In hospital Has patient had a PCN reaction occurring within the last 10 years: Yes If all of the above answers are "NO", then may proceed with  Cephalosporin use.  Other reaction(s): Other (See Comments) Urinate on self, pass out Has patient had a PCN reaction causing immediate rash, facial/tongue/throat swelling, SOB or lightheadedness with hypotension: Yes Has patient had a PCN reaction causing severe rash involving mucus membranes or skin necrosis: No Has patient had a PCN reaction that required hospitalization: Yes- In hospital Has patient had a PCN reaction occurring within the last 10 years: Yes If all of the above answers are "NO", then may proceed with Cephalosporin use. "I pass out"     Family History: Family History  Problem Relation Age of Onset   Cancer Mother     Social History:  reports that he has been smoking cigarettes. He has been smoking an average of 2 packs per day. He has never  used smokeless tobacco. He reports current alcohol use of about 2.0 standard drinks of alcohol per week. He reports that he does not use drugs.  ROS: For pertinent review of systems please refer to history of present illness  Physical Exam: BP (!) 173/74   Pulse (!) 54   Ht '5\' 5"'$  (1.651 m)   Wt 116 lb (52.6 kg)   BMI 19.30 kg/m    Laboratory Data:    Latest Ref Rng & Units 05/30/2022    4:17 PM 05/22/2022    1:19 PM 04/18/2022   12:03 AM  CMP  Glucose 70 - 99 mg/dL 91  81  81   BUN 8 - 23 mg/dL '23  26  19   '$ Creatinine 0.61 - 1.24 mg/dL 1.03  1.18  1.11   Sodium 135 - 145 mmol/L 141  140  142   Potassium 3.5 - 5.1 mmol/L 3.7  3.8  3.6   Chloride 98 - 111 mmol/L 107  109  109   CO2 22 - 32 mmol/L '30  28  30   '$ Calcium 8.9 - 10.3 mg/dL 9.0  9.0  8.8   Total Protein 6.5 - 8.1 g/dL 6.7  7.2  7.6   Total Bilirubin 0.3 - 1.2 mg/dL 0.6  1.0  0.9   Alkaline Phos 38 - 126 U/L 56  54  60   AST 15 - 41 U/L 37  83  32   ALT 0 - 44 U/L 29  44  22        Latest Ref Rng & Units 05/30/2022    4:17 PM 05/22/2022    1:19 PM 04/18/2022   12:03 AM  CBC  WBC 4.0 - 10.5 K/uL 6.1  5.5  4.3   Hemoglobin 13.0 - 17.0 g/dL 12.0  12.3  12.7    Hematocrit 39.0 - 52.0 % 34.4  34.6  35.8   Platelets 150 - 400 K/uL 160  133  162     Component     Latest Ref Rng 05/22/2022  Specific Gravity, UA     1.005 - 1.030    pH, UA     5.0 - 7.5    Color, UA     Yellow    Appearance Ur     Clear    Leukocytes,UA     Negative    Protein,UA     Negative/Trace    Glucose, UA     NEGATIVE mg/dL NEGATIVE   Ketones, UA     Negative    RBC, UA     Negative    Bilirubin, UA     Negative    Urobilinogen, Ur     0.2 - 1.0 mg/dL   Nitrite, UA     Negative    Color, Urine     YELLOW  YELLOW !   Appearance     CLEAR  HAZY !   Specific Gravity, Urine     1.005 - 1.030  1.026   pH     5.0 - 8.0  5.0   Hgb urine dipstick     NEGATIVE  NEGATIVE   Bilirubin Urine     NEGATIVE  NEGATIVE   Ketones, ur     NEGATIVE mg/dL NEGATIVE   Protein     NEGATIVE mg/dL 30 !   Nitrite     NEGATIVE  NEGATIVE   Leukocytes,Ua     NEGATIVE  NEGATIVE   WBC, UA     0 -  5 WBC/hpf 0-5   Bacteria, UA     NONE SEEN  NONE SEEN   Squamous Epithelial / LPF     0 - 5  0-5   RBC / HPF     0 - 5 RBC/hpf 0-5   Mucus PRESENT   Hyaline Casts, UA PRESENT   Microscopic Examination   RBC, Urine     0 - 2 /hpf   Epithelial Cells (non renal)     0 - 10 /hpf   Mucus, UA     Not Estab.      Legend: ! Abnormal I have reviewed the labs.  Pertinent Imaging N/A     Assessment & Plan:    1. Prostate cancer PSA pending  2. LU TS 3. Erectile dysfunction  Patient had to leave before being seen due to being incontinent of stool.   [name], PA-C   [facility] [address]  [place], [place] [address] [phone]

## 2022-06-09 ENCOUNTER — Ambulatory Visit (INDEPENDENT_AMBULATORY_CARE_PROVIDER_SITE_OTHER): Payer: Medicare Other | Admitting: Urology

## 2022-06-09 VITALS — BP 173/74 | HR 54 | Ht 65.0 in | Wt 116.0 lb

## 2022-06-09 DIAGNOSIS — N529 Male erectile dysfunction, unspecified: Secondary | ICD-10-CM

## 2022-06-09 DIAGNOSIS — C61 Malignant neoplasm of prostate: Secondary | ICD-10-CM

## 2022-06-09 DIAGNOSIS — R399 Unspecified symptoms and signs involving the genitourinary system: Secondary | ICD-10-CM

## 2022-06-09 LAB — BLADDER SCAN AMB NON-IMAGING

## 2022-06-09 NOTE — Progress Notes (Signed)
Error

## 2022-06-10 ENCOUNTER — Telehealth: Payer: Self-pay

## 2022-06-10 LAB — PSA: Prostate Specific Ag, Serum: 0.6 ng/mL (ref 0.0–4.0)

## 2022-06-10 NOTE — Telephone Encounter (Signed)
-----   Message from Nori Riis, PA-C sent at 06/10/2022  7:39 AM EDT ----- We need to get him rescheduled for an office visit.

## 2022-06-10 NOTE — Telephone Encounter (Signed)
Attempted to reach pt twice, unable to leave a msg, no voicemail setup. Sending pt appt letter.

## 2022-06-15 ENCOUNTER — Emergency Department: Payer: Medicare Other

## 2022-06-15 ENCOUNTER — Emergency Department
Admission: EM | Admit: 2022-06-15 | Discharge: 2022-06-15 | Disposition: A | Discharge status: Home / Self Care | Payer: Medicare Other | Attending: Emergency Medicine | Admitting: Emergency Medicine

## 2022-06-15 DIAGNOSIS — T40601A Poisoning by unspecified narcotics, accidental (unintentional), initial encounter: Secondary | ICD-10-CM | POA: Insufficient documentation

## 2022-06-15 LAB — COMPREHENSIVE METABOLIC PANEL
ALT: 21 U/L (ref 0–44)
AST: 30 U/L (ref 15–41)
Albumin: 3.5 g/dL (ref 3.5–5.0)
Alkaline Phosphatase: 61 U/L (ref 38–126)
Anion gap: 9 (ref 5–15)
BUN: 22 mg/dL (ref 8–23)
CO2: 28 mmol/L (ref 22–32)
Calcium: 9.2 mg/dL (ref 8.9–10.3)
Chloride: 104 mmol/L (ref 98–111)
Creatinine, Ser: 1.36 mg/dL — ABNORMAL HIGH (ref 0.61–1.24)
GFR, Estimated: 57 mL/min — ABNORMAL LOW (ref >60)
Glucose, Bld: 92 mg/dL (ref 70–99)
Potassium: 4.2 mmol/L (ref 3.5–5.1)
Sodium: 141 mmol/L (ref 135–145)
Total Bilirubin: 0.9 mg/dL (ref 0.3–1.2)
Total Protein: 7.1 g/dL (ref 6.5–8.1)

## 2022-06-15 LAB — CBC WITH DIFFERENTIAL/PLATELET
Abs Immature Granulocytes: 0.01 10*3/uL (ref 0.00–0.07)
Basophils Absolute: 0 10*3/uL (ref 0.0–0.1)
Basophils Relative: 1 %
Eosinophils Absolute: 0.1 10*3/uL (ref 0.0–0.5)
Eosinophils Relative: 2 %
HCT: 32.1 % — ABNORMAL LOW (ref 39.0–52.0)
Hemoglobin: 11.3 g/dL — ABNORMAL LOW (ref 13.0–17.0)
Immature Granulocytes: 0 %
Lymphocytes Relative: 18 %
Lymphs Abs: 0.8 10*3/uL (ref 0.7–4.0)
MCH: 29 pg (ref 26.0–34.0)
MCHC: 35.2 g/dL (ref 30.0–36.0)
MCV: 82.3 fL (ref 80.0–100.0)
Monocytes Absolute: 0.5 10*3/uL (ref 0.1–1.0)
Monocytes Relative: 11 %
Neutro Abs: 2.9 10*3/uL (ref 1.7–7.7)
Neutrophils Relative %: 68 %
Platelets: 167 10*3/uL (ref 150–400)
RBC: 3.9 MIL/uL — ABNORMAL LOW (ref 4.22–5.81)
RDW: 14.6 % (ref 11.5–15.5)
WBC: 4.3 10*3/uL (ref 4.0–10.5)
nRBC: 0 % (ref 0.0–0.2)

## 2022-06-15 LAB — ETHANOL: Alcohol, Ethyl (B): <10 mg/dL (ref <10)

## 2022-06-15 LAB — ACETAMINOPHEN LEVEL: Acetaminophen (Tylenol), Serum: <10 ug/mL — ABNORMAL LOW (ref 10–30)

## 2022-06-15 MED ORDER — ONDANSETRON HCL 4 MG/2ML IJ SOLN
4.0000 mg | Freq: Once | INTRAMUSCULAR | Status: AC
  Administered 2022-06-15: 4 mg via INTRAVENOUS
  Filled 2022-06-15: qty 2

## 2022-06-15 MED ORDER — NALOXONE HCL 0.4 MG/ML IJ SOLN: INTRAMUSCULAR | 1 refills | Status: DC

## 2022-06-15 MED ORDER — NALOXONE HCL 2 MG/2ML IJ SOSY
2.0000 mg | PREFILLED_SYRINGE | Freq: Once | INTRAMUSCULAR | Status: AC
  Administered 2022-06-15: 2 mg via INTRAVENOUS

## 2022-06-15 NOTE — ED Triage Notes (Signed)
Pt became unresponsive at bank, no fall, no history known

## 2022-06-15 NOTE — ED Notes (Signed)
Pt asked ed rn to call bank to see if patient made withdrawal , ed rn stated that he was unable to call bank , and that bank will not give rn personal information regarding patients account, pt also asked for water , pt has pending CT that had to be completed so pt npo at this time . Ed rn will ask md if patient can have water.

## 2022-06-15 NOTE — ED Notes (Signed)
Pt stated he snorted drugs, removed package from sock , pt now axox4 post narcan administration

## 2022-06-15 NOTE — ED Notes (Signed)
Gave patient a [place] sandwich and apple juice

## 2022-06-15 NOTE — ED Provider Notes (Signed)
[facility] Provider Note    Event Date/Time   First MD Initiated Contact with Patient 06/15/22 1237     (approximate)   History   Altered Mental Status (Pty at bank became unresponsive, staff sat pt on chair, unknown history )   HPI  Alexander Avery is a 66 y.o. male here with altered mental status.  The patient was found unresponsive at the bank.  He reportedly was standing in line when he acutely became unresponsive.  He he was brought in by EMS.  He is [place] neck.  He is altered.  He arrives minimally responsive.  Narcan given and patient immediately woke up saying he was thirsty and nauseous.  He admits to using what he thought was heroin, snorting it before he went to the bank.  He believes it may have been laced with fentanyl.  States he was well prior to this.  No other complaints.  This was accidental.     Physical Exam   Triage Vital Signs: ED Triage Vitals [06/15/22 1238]  Enc Vitals Group     BP (!) 155/75     Pulse Rate 60     Resp 17     Temp 98.4 F (36.9 C)     Temp Source Axillary     SpO2 94 %     Weight 121 lb 4.1 oz (55 kg)     Height '5\' 8"'$  (1.727 m)     Head Circumference      Peak Flow      Pain Score 2     Pain Loc      Pain Edu?      Excl. in [place]?     Most recent vital signs: Vitals:   06/15/22 1500 06/15/22 1550  BP: (!) 144/73 (!) 143/77  Pulse: (!) 54 60  Resp: 18 17  Temp: 97.9 F (36.6 C) 98.5 F (36.9 C)  SpO2: 100% 98%     General: Initially unresponsive, pinpoint pupils. CV:  Good peripheral perfusion.  Regular rate. Resp:  Normal effort. Bradypnea. Abd:  No distention.  No tenderness. Other:  Pupils pinpoint.  No other focal deficits.   ED Results / Procedures / Treatments   Labs (all labs ordered are listed, but only abnormal results are displayed) Labs Reviewed  CBC WITH DIFFERENTIAL/PLATELET - Abnormal; Notable for the following components:      Result Value   RBC 3.90 (*)     Hemoglobin 11.3 (*)    HCT 32.1 (*)    All other components within normal limits  COMPREHENSIVE METABOLIC PANEL - Abnormal; Notable for the following components:   Creatinine, Ser 1.36 (*)    GFR, Estimated 57 (*)    All other components within normal limits  ACETAMINOPHEN LEVEL - Abnormal; Notable for the following components:   Acetaminophen (Tylenol), Serum <10 (*)    All other components within normal limits  ETHANOL  SALICYLATE LEVEL     EKG Normal sinus rhythm, trickle rate 59.  PR 180, QRS 79, QTc 479.  T wave inversions noted, no acute ST elevations.   RADIOLOGY Chest x-ray: No acute abnormality CT head: No acute intracranial abnormality   I also independently reviewed and agree with radiologist interpretations.   PROCEDURES:  Critical Care performed: Yes, see critical care procedure note(s)  .Critical Care  Performed by: Duffy Bruce, MD Authorized by: Duffy Bruce, MD   Critical care provider statement:    Critical care time (minutes):  30  Critical care time was exclusive of:  Separately billable procedures and treating other patients   Critical care was necessary to treat or prevent imminent or life-threatening deterioration of the following conditions:  Cardiac failure, circulatory failure and respiratory failure   Critical care was time spent personally by me on the following activities:  Development of treatment plan with patient or surrogate, discussions with consultants, evaluation of patient's response to treatment, examination of patient, ordering and review of laboratory studies, ordering and review of radiographic studies, ordering and performing treatments and interventions, pulse oximetry, re-evaluation of patient's condition and review of old charts   I assumed direction of critical care for this patient from another provider in my specialty: no       MEDICATIONS ORDERED IN ED: Medications  naloxone (NARCAN) injection 2 mg (2 mg  Intravenous Given 06/15/22 1246)  ondansetron (ZOFRAN) injection 4 mg (4 mg Intravenous Given 06/15/22 1254)     IMPRESSION / MDM / [place] / ED COURSE  I reviewed the triage vital signs and the nursing notes.                               The patient is on the cardiac monitor to evaluate for evidence of arrhythmia and/or significant heart rate changes.   Ddx:  Differential includes the following, with pertinent life- or limb-threatening emergencies considered:  Accidental opiate overdose, polypharmacy/polysubstance overdose, seizure, stroke, syncope  Patient's presentation is most consistent with acute presentation with potential threat to life or bodily function.  MDM:  66 year old male here with acute altered mental status.  Initially presented emergency traffic with significant confusion and altered mental status.  He was hypoxic and brought apneic.  On assessment, the patient does appear to have pinpoint pupils.  I reviewed his record and he has a history of substance abuse.  IV Narcan given, with significant improvement in his mental status and complete resolution of his bradypnea and hypoxia.  Patient is now awake and alert.  CT head obtained, reviewed, shows no acute normality.  He has no focal neurological deficits.  His lab work is otherwise very reassuring.  CBC shows no leukocytosis or anemia.  CMP unremarkable with exception of possible mild dehydration.  Patient was monitored for several hours.  EKG shows no acute ischemia.  After several hours, patient has had no recurrence of opioid necrosis.  Patient is awake and alert.  Denies any intentional overdose.  He is aware of likelihood of fentanyl overdose, will provide Narcan prescription as an outpatient and DC with outpatient resources.  Counseled him to throw away any remaining drugs.   MEDICATIONS GIVEN IN ED: Medications  naloxone (NARCAN) injection 2 mg (2 mg Intravenous Given 06/15/22 1246)  ondansetron (ZOFRAN)  injection 4 mg (4 mg Intravenous Given 06/15/22 1254)     Consults:     EMR reviewed       FINAL CLINICAL IMPRESSION(S) / ED DIAGNOSES   Final diagnoses:  Opiate overdose, accidental or unintentional, initial encounter [facility])     Rx / DC Orders   ED Discharge Orders          Ordered    naloxone (NARCAN) 0.4 MG/ML injection        06/15/22 1550             Note:  This document was prepared using Dragon voice recognition software and may include unintentional dictation errors.   Duffy Bruce, MD  06/15/22 1608  

## 2022-07-13 ENCOUNTER — Emergency Department
Admission: EM | Admit: 2022-07-13 | Discharge: 2022-07-13 | Disposition: A | Discharge status: Home / Self Care | Payer: Medicare Other | Attending: Emergency Medicine | Admitting: Emergency Medicine

## 2022-07-13 DIAGNOSIS — E119 Type 2 diabetes mellitus without complications: Secondary | ICD-10-CM | POA: Insufficient documentation

## 2022-07-13 DIAGNOSIS — T402X1A Poisoning by other opioids, accidental (unintentional), initial encounter: Secondary | ICD-10-CM | POA: Insufficient documentation

## 2022-07-13 DIAGNOSIS — I10 Essential (primary) hypertension: Secondary | ICD-10-CM | POA: Insufficient documentation

## 2022-07-13 DIAGNOSIS — T50901A Poisoning by unspecified drugs, medicaments and biological substances, accidental (unintentional), initial encounter: Secondary | ICD-10-CM

## 2022-07-13 DIAGNOSIS — Z8546 Personal history of malignant neoplasm of prostate: Secondary | ICD-10-CM | POA: Diagnosis not present

## 2022-07-13 LAB — CBC WITH DIFFERENTIAL/PLATELET
Abs Immature Granulocytes: 0.01 10*3/uL (ref 0.00–0.07)
Basophils Absolute: 0 10*3/uL (ref 0.0–0.1)
Basophils Relative: 1 %
Eosinophils Absolute: 0.2 10*3/uL (ref 0.0–0.5)
Eosinophils Relative: 3 %
HCT: 34.9 % — ABNORMAL LOW (ref 39.0–52.0)
Hemoglobin: 12.1 g/dL — ABNORMAL LOW (ref 13.0–17.0)
Immature Granulocytes: 0 %
Lymphocytes Relative: 23 %
Lymphs Abs: 1.8 10*3/uL (ref 0.7–4.0)
MCH: 29.2 pg (ref 26.0–34.0)
MCHC: 34.7 g/dL (ref 30.0–36.0)
MCV: 84.3 fL (ref 80.0–100.0)
Monocytes Absolute: 0.8 10*3/uL (ref 0.1–1.0)
Monocytes Relative: 10 %
Neutro Abs: 4.9 10*3/uL (ref 1.7–7.7)
Neutrophils Relative %: 63 %
Platelets: 152 10*3/uL (ref 150–400)
RBC: 4.14 MIL/uL — ABNORMAL LOW (ref 4.22–5.81)
RDW: 14.6 % (ref 11.5–15.5)
WBC: 7.6 10*3/uL (ref 4.0–10.5)
nRBC: 0 % (ref 0.0–0.2)

## 2022-07-13 LAB — COMPREHENSIVE METABOLIC PANEL
ALT: 34 U/L (ref 0–44)
AST: 79 U/L — ABNORMAL HIGH (ref 15–41)
Albumin: 3.7 g/dL (ref 3.5–5.0)
Alkaline Phosphatase: 62 U/L (ref 38–126)
Anion gap: 7 (ref 5–15)
BUN: 28 mg/dL — ABNORMAL HIGH (ref 8–23)
CO2: 28 mmol/L (ref 22–32)
Calcium: 9.3 mg/dL (ref 8.9–10.3)
Chloride: 106 mmol/L (ref 98–111)
Creatinine, Ser: 1.38 mg/dL — ABNORMAL HIGH (ref 0.61–1.24)
GFR, Estimated: 56 mL/min — ABNORMAL LOW (ref >60)
Glucose, Bld: 95 mg/dL (ref 70–99)
Potassium: 3.8 mmol/L (ref 3.5–5.1)
Sodium: 141 mmol/L (ref 135–145)
Total Bilirubin: 0.9 mg/dL (ref 0.3–1.2)
Total Protein: 7.4 g/dL (ref 6.5–8.1)

## 2022-07-13 LAB — SALICYLATE LEVEL: Salicylate Lvl: <7 mg/dL — ABNORMAL LOW (ref 7.0–30.0)

## 2022-07-13 LAB — ACETAMINOPHEN LEVEL: Acetaminophen (Tylenol), Serum: <10 ug/mL — ABNORMAL LOW (ref 10–30)

## 2022-07-13 LAB — ETHANOL: Alcohol, Ethyl (B): <10 mg/dL (ref <10)

## 2022-07-13 MED ORDER — NALOXONE HCL 4 MG/0.1ML NA LIQD
1.0000 | Freq: Once | NASAL | Status: AC
  Administered 2022-07-13: 1 via NASAL
  Filled 2022-07-13: qty 4

## 2022-07-13 MED ORDER — NALOXONE HCL 2 MG/2ML IJ SOSY: 0.4000 mg | PREFILLED_SYRINGE | Freq: Once | INTRAMUSCULAR | Status: DC

## 2022-07-13 NOTE — ED Notes (Signed)
Pt pulled out of car by this RN and Investment banker, operational. Friend with pt states he was taking pt to get his medications when pt went unresponsive. Pt was  thought to be having stroke. Friend states hx of blacking out and that pt does use suboxone and fent. Unsure if pt got some today because he was picked up from street corner.

## 2022-07-13 NOTE — Discharge Instructions (Signed)
Follow-up with your provider as needed.  Continue care through the Suboxone clinic.  Consider outpatient substance abuse counseling.

## 2022-07-13 NOTE — ED Triage Notes (Signed)
Pt presents to the ED via POV due to arm contraction and not responding. Pt's friend states " I picked him up from the [place] station

## 2022-07-13 NOTE — ED Provider Notes (Signed)
[facility] Emergency Department Provider Note     None    (approximate)   History   Loss of Consciousness and Drug Overdose  Patient initially unable to give HPI secondary to obtunded state.  HPI  Alexander Avery is a 66 y.o. male with a history of polysubstance abuse disorder, prostate cancer, HTN, DM, seizure disorder, and Suboxone use, presents to the ED unresponsive via personal vehicle.  Patient was sitting in the care with his friend Georgina Snell) who brings him in after the patient went unresponsive in Corey's car.  They were in route to the Suboxone clinic.  The patient admitted to snorting 2 small bags of what he thought was cocaine, likely laced with fentanyl.  Patient presents unresponsive, breathing spontaneously, but in a catatonic state.  History is provided after the patient was given 1 dose of intranasal Narcan and received gained spontaneous consciousness.   Physical Exam   Triage Vital Signs: ED Triage Vitals  Enc Vitals Group     BP      Pulse      Resp      Temp      Temp src      SpO2      Weight      Height      Head Circumference      Peak Flow      Pain Score      Pain Loc      Pain Edu?      Excl. in [place]?     Most recent vital signs: Vitals:   07/13/22 1730 07/13/22 1800  BP: (!) 171/75 (!) 149/70  Pulse: (!) 49 (!) 59  Resp: 17 15  Temp:  98.3 F (36.8 C)  SpO2: 100% 100%    General Awake, no distress. NAD HEENT NCAT. PERRL. EOMI. No rhinorrhea. Mucous membranes are moist.  CV:  Good peripheral perfusion. RRR RESP:  Normal effort. CTA ABD:  No distention.    ED Results / Procedures / Treatments   Labs (all labs ordered are listed, but only abnormal results are displayed) Labs Reviewed  COMPREHENSIVE METABOLIC PANEL - Abnormal; Notable for the following components:      Result Value   BUN 28 (*)    Creatinine, Ser 1.38 (*)    AST 79 (*)    GFR, Estimated 56 (*)    All other components within normal  limits  SALICYLATE LEVEL - Abnormal; Notable for the following components:   Salicylate Lvl <3.4 (*)    All other components within normal limits  ACETAMINOPHEN LEVEL - Abnormal; Notable for the following components:   Acetaminophen (Tylenol), Serum <10 (*)    All other components within normal limits  CBC WITH DIFFERENTIAL/PLATELET - Abnormal; Notable for the following components:   RBC 4.14 (*)    Hemoglobin 12.1 (*)    HCT 34.9 (*)    All other components within normal limits  ETHANOL  URINE DRUG SCREEN, QUALITATIVE ([facility] ONLY)  CBG MONITORING, ED     EKG  Vent. rate 69 BPM PR interval 154 ms QRS duration 119 ms QT/QTcB 372/399 ms P-R-T axes 86 -30 74  RADIOLOGY  No results found.   PROCEDURES:  Critical Care performed: Yes  CRITICAL CARE Performed by: Melvenia Needles   Total critical care time: 30 minutes  Critical care time was exclusive of separately billable procedures and treating other patients.  Critical care was necessary to treat or prevent imminent or  life-threatening deterioration.  Critical care was time spent personally by me on the following activities: development of treatment plan with patient and/or surrogate as well as nursing, discussions with consultants, evaluation of patient's response to treatment, examination of patient, obtaining history from patient or surrogate, ordering and performing treatments and interventions, ordering and review of laboratory studies, ordering and review of radiographic studies, pulse oximetry and re-evaluation of patient's condition.   Procedures   MEDICATIONS ORDERED IN ED: Medications  naloxone (NARCAN) injection 0.4 mg (0.4 mg Intravenous Not Given 07/13/22 1606)  naloxone (NARCAN) nasal spray 4 mg/0.1 mL (1 spray Nasal Provided for home use 07/13/22 1517)     IMPRESSION / MDM / ASSESSMENT AND PLAN / ED COURSE  I reviewed the triage vital signs and the nursing notes.                               Differential diagnosis includes, but is not limited to, alcohol, illicit or prescription medications, or other toxic ingestion; intracranial pathology such as stroke or intracerebral hemorrhage; fever or infectious causes including sepsis; hypoxemia and/or hypercarbia; uremia; trauma; endocrine related disorders such as diabetes, hypoglycemia, and thyroid-related diseases; hypertensive encephalopathy; etc.   Patient's presentation is most consistent with acute presentation with potential threat to life or bodily function.  The patient is on the cardiac monitor to evaluate for evidence of arrhythmia and/or significant heart rate changes.  Patient to the ED for evaluation of acute opioid intoxication.  Patient presents via personal vehicle with reports of being unresponsive.  Patient admits to snorting two "dime bags" of cocaine, likely laced with fentanyl.  Patient breathing spontaneously upon evaluation, responded to a single intranasal Narcan dose.  Patient with spontaneous spots is an movements.  Patient is evaluated otherwise reassuring overall without any signs of acute electrolyte abnormalities, malignant arrhythmias, or neuro deficits.  Patient's diagnosis is consistent with cute opioid overdose question accidental versus unintentional.  She is ambulatory to the nurses desk at this time, requesting his discharge paperwork noting his ride will be here in 30 minutes.. Patient will be discharged home with instructions for outpatient substance abuse counseling. Patient is to follow up with his PCP or Suboxone clinic as needed or otherwise directed. Patient is given ED precautions to return to the ED for any worsening or new symptoms.     FINAL CLINICAL IMPRESSION(S) / ED DIAGNOSES   Final diagnoses:  Accidental overdose, initial encounter  Opioid overdose, accidental or unintentional, initial encounter [facility])     Rx / DC Orders   ED Discharge Orders     None        Note:  This  document was prepared using Dragon voice recognition software and may include unintentional dictation errors.    Melvenia Needles, PA-C 07/13/22 1906    Nena Polio, MD 07/13/22 [phone]

## 2022-08-02 ENCOUNTER — Emergency Department: Payer: Medicare Other

## 2022-08-02 ENCOUNTER — Encounter: Payer: Self-pay | Admitting: *Deleted

## 2022-08-02 ENCOUNTER — Other Ambulatory Visit: Payer: Self-pay

## 2022-08-02 ENCOUNTER — Emergency Department
Admission: EM | Admit: 2022-08-02 | Discharge: 2022-08-02 | Disposition: A | Discharge status: Home / Self Care | Payer: Medicare Other | Attending: Student in an Organized Health Care Education/Training Program | Admitting: Student in an Organized Health Care Education/Training Program

## 2022-08-02 DIAGNOSIS — Z5321 Procedure and treatment not carried out due to patient leaving prior to being seen by health care provider: Secondary | ICD-10-CM | POA: Insufficient documentation

## 2022-08-02 DIAGNOSIS — R030 Elevated blood-pressure reading, without diagnosis of hypertension: Secondary | ICD-10-CM | POA: Insufficient documentation

## 2022-08-02 LAB — BASIC METABOLIC PANEL
Anion gap: 10 (ref 5–15)
BUN: 16 mg/dL (ref 8–23)
CO2: 29 mmol/L (ref 22–32)
Calcium: 8.9 mg/dL (ref 8.9–10.3)
Chloride: 102 mmol/L (ref 98–111)
Creatinine, Ser: 1.05 mg/dL (ref 0.61–1.24)
GFR, Estimated: >60 mL/min (ref >60)
Glucose, Bld: 95 mg/dL (ref 70–99)
Potassium: 3.6 mmol/L (ref 3.5–5.1)
Sodium: 141 mmol/L (ref 135–145)

## 2022-08-02 LAB — CBC
HCT: 32 % — ABNORMAL LOW (ref 39.0–52.0)
Hemoglobin: 11.2 g/dL — ABNORMAL LOW (ref 13.0–17.0)
MCH: 29.8 pg (ref 26.0–34.0)
MCHC: 35 g/dL (ref 30.0–36.0)
MCV: 85.1 fL (ref 80.0–100.0)
Platelets: 163 10*3/uL (ref 150–400)
RBC: 3.76 MIL/uL — ABNORMAL LOW (ref 4.22–5.81)
RDW: 15.1 % (ref 11.5–15.5)
WBC: 4.8 10*3/uL (ref 4.0–10.5)
nRBC: 0 % (ref 0.0–0.2)

## 2022-08-02 LAB — TROPONIN I (HIGH SENSITIVITY): Troponin I (High Sensitivity): 29 ng/L — ABNORMAL HIGH (ref <18)

## 2022-08-02 NOTE — ED Notes (Signed)
Pt called and not answering at this time.

## 2022-08-02 NOTE — ED Triage Notes (Addendum)
Pt reports elevated blood pressure for several days.  No chest pain or sob.  No n/v  no h/a  no dizziness.  Pt out of bp meds for 2 years.  Pt alert speech clear.

## 2022-08-02 NOTE — ED Notes (Signed)
Pt found pt had been outside. Pt taken into triage room and EKG started at this time.

## 2022-08-19 ENCOUNTER — Encounter: Payer: Self-pay | Admitting: Urology

## 2022-09-07 NOTE — Progress Notes (Deleted)
01/31/2019 9:37 PM   Alexander Avery 01-23-1956 [id number]  Referring provider: Jodi Marble, MD [place],  [place] [address]  Urological history 1. Prostate cancer - PSA pending - diagnosed at [facility] - iPSA 15.0 - DRE nodularity and induration in the left lobe.  Biopsy was performed on 02/05/2015.  His prostate was 20.5 grams PSA density of 0.73.   Gleason score 6 and 7 (3+4) involving all 6 biopsies zones - RRP on 04/21/2015 -pathology showed a Gleason score 7 involving 25% of the gland with negative surgical margins - ADT 04/2019 - Completed salvage radiation therapy 07/2019   2. Urethral stricture - underwent urethral dilation for a stricture at the vesicourethral anastomosis on November 2016 with Leander Rams sounds - cytoscopy with Dr. Bernardo Heater on 09/15/2018, no evidence of urethral stricture or bladder neck contracture  3. LU TS -Contributing factors of age, prostate cancer, pelvic radiation, pelvic surgery, cocaine use, hypertension, sleep apnea, diabetes, arthritis, depression, anxiety and diuretics - I PSS *** - PVR *** mL  4. ED - contributing factors of age, pelvic radiation, prostate cancer, DM, HTN, HLD, hypothyroidism, sleep apnea, stress, anxiety, alcohol abuse and smoking - SHIM *** - sildenafil 100 mg on-demand-dosing  No chief complaint on file.    HPI: Alexander Avery is a 66 y.o. male who presents today for follow up.   UA ***  PVR ***      Score: 1-7 Severe ED 8-11 Moderate ED 12-16 Mild-Moderate ED 17-21 Mild ED 22-25 No ED  PMH: Past Medical History:  Diagnosis Date   Anxiety    Arthritis    Cancer ([place])    prostate   Cataract 2019   Diabetes mellitus without complication (HCC)    GERD (gastroesophageal reflux disease)    Hernia of abdominal wall 2015   History of stomach ulcers    Hypertension    Prostate cancer ([place])    Seizures ([place])    Sleep apnea     Surgical History: Past Surgical History:   Procedure Laterality Date   CATARACT EXTRACTION W/PHACO Right 03/30/2018   Procedure: CATARACT EXTRACTION PHACO AND INTRAOCULAR LENS PLACEMENT ([place]);  Surgeon: Eulogio Bear, MD;  Location: [facility] ORS;  Service: Ophthalmology;  Laterality: Right;  [place] 01:09.5 AP% 10.4 CDE 7.98 Fluid pack lot # [id number]   CATARACT EXTRACTION W/PHACO Left 06/29/2018   Procedure: CATARACT EXTRACTION PHACO AND INTRAOCULAR LENS PLACEMENT ([facility]);  Surgeon: Eulogio Bear, MD;  Location: [facility] ORS;  Service: Ophthalmology;  Laterality: Left;  [place] 01:27.7 AP% 8.2 CDE 7.27 Fluid pack lot # [id number]   EYE SURGERY     HERNIA REPAIR     PROSTATE SURGERY     PROSTATE SURGERY  2016    Home Medications:  Allergies as of 09/08/2022       Reactions   Penicillins Hives, Other (See Comments)   Urinate on self, pass out Has patient had a PCN reaction causing immediate rash, facial/tongue/throat swelling, SOB or lightheadedness with hypotension: Yes Has patient had a PCN reaction causing severe rash involving mucus membranes or skin necrosis: No Has patient had a PCN reaction that required hospitalization: Yes- In hospital Has patient had a PCN reaction occurring within the last 10 years: Yes If all of the above answers are "NO", then may proceed with Cephalosporin use. Other reaction(s): Other (See Comments) Urinate on self, pass out Has patient had a PCN reaction causing immediate rash, facial/tongue/throat swelling, SOB or lightheadedness with hypotension: Yes  Has patient had a PCN reaction causing severe rash involving mucus membranes or skin necrosis: No Has patient had a PCN reaction that required hospitalization: Yes- In hospital Has patient had a PCN reaction occurring within the last 10 years: Yes If all of the above answers are "NO", then may proceed with Cephalosporin use. "I pass out"        Medication List        Accurate as of September 07, 2022  9:37 PM. If you have any questions, ask your  nurse or doctor.          cloNIDine 0.1 MG tablet Commonly known as: CATAPRES Take 0.1 mg by mouth 2 (two) times daily as needed.   diclofenac Sodium 1 % Gel Commonly known as: VOLTAREN SMARTSIG:4 Gram(s) Topical Twice Daily PRN   furosemide 20 MG tablet Commonly known as: Lasix Take 1 tablet (20 mg total) by mouth daily for 7 days.   naloxone 0.4 MG/ML injection Commonly known as: NARCAN Take as directed on packaging as needed for accidental overdose.   potassium chloride SA 20 MEQ tablet Commonly known as: KLOR-CON M Take 1 tablet (20 mEq total) by mouth once for 1 dose.   Suboxone 8-2 MG Film Generic drug: Buprenorphine HCl-Naloxone HCl Place 1 strip under the tongue daily.        Allergies:  Allergies  Allergen Reactions   Penicillins Hives and Other (See Comments)    Urinate on self, pass out Has patient had a PCN reaction causing immediate rash, facial/tongue/throat swelling, SOB or lightheadedness with hypotension: Yes Has patient had a PCN reaction causing severe rash involving mucus membranes or skin necrosis: No Has patient had a PCN reaction that required hospitalization: Yes- In hospital Has patient had a PCN reaction occurring within the last 10 years: Yes If all of the above answers are "NO", then may proceed with Cephalosporin use.  Other reaction(s): Other (See Comments) Urinate on self, pass out Has patient had a PCN reaction causing immediate rash, facial/tongue/throat swelling, SOB or lightheadedness with hypotension: Yes Has patient had a PCN reaction causing severe rash involving mucus membranes or skin necrosis: No Has patient had a PCN reaction that required hospitalization: Yes- In hospital Has patient had a PCN reaction occurring within the last 10 years: Yes If all of the above answers are "NO", then may proceed with Cephalosporin use. "I pass out"     Family History: Family History  Problem Relation Age of Onset   Cancer Mother      Social History:  reports that he has been smoking cigarettes. He has been smoking an average of 2 packs per day. He has never used smokeless tobacco. He reports current alcohol use of about 2.0 standard drinks of alcohol per week. He reports that he does not use drugs.  ROS: For pertinent review of systems please refer to history of present illness  Physical Exam: There were no vitals taken for this visit.   Laboratory Data:    Latest Ref Rng & Units 08/02/2022    3:23 PM 07/13/2022    3:16 PM 06/15/2022   12:40 PM  CMP  Glucose 70 - 99 mg/dL 95  95  92   BUN 8 - 23 mg/dL '16  28  22   '$ Creatinine 0.61 - 1.24 mg/dL 1.05  1.38  1.36   Sodium 135 - 145 mmol/L 141  141  141   Potassium 3.5 - 5.1 mmol/L 3.6  3.8  4.2  Chloride 98 - 111 mmol/L 102  106  104   CO2 22 - 32 mmol/L '29  28  28   '$ Calcium 8.9 - 10.3 mg/dL 8.9  9.3  9.2   Total Protein 6.5 - 8.1 g/dL  7.4  7.1   Total Bilirubin 0.3 - 1.2 mg/dL  0.9  0.9   Alkaline Phos 38 - 126 U/L  62  61   AST 15 - 41 U/L  79  30   ALT 0 - 44 U/L  34  21        Latest Ref Rng & Units 08/02/2022    3:23 PM 07/13/2022    3:16 PM 06/15/2022   12:40 PM  CBC  WBC 4.0 - 10.5 K/uL 4.8  7.6  4.3   Hemoglobin 13.0 - 17.0 g/dL 11.2  12.1  11.3   Hematocrit 39.0 - 52.0 % 32.0  34.9  32.1   Platelets 150 - 400 K/uL 163  152  167    Urinalysis *** I have reviewed the labs.  Pertinent Imaging ***     Assessment & Plan:    1. Prostate cancer PSA pending  2. LU TS 3. Erectile dysfunction  Patient had to leave before being seen due to being incontinent of stool.   [place], [place] [address]  [place], [place] [address] [phone]

## 2022-09-08 ENCOUNTER — Ambulatory Visit: Payer: Medicare Other | Admitting: Urology

## 2022-09-08 DIAGNOSIS — C61 Malignant neoplasm of prostate: Secondary | ICD-10-CM

## 2022-09-08 DIAGNOSIS — R399 Unspecified symptoms and signs involving the genitourinary system: Secondary | ICD-10-CM

## 2022-09-08 DIAGNOSIS — N529 Male erectile dysfunction, unspecified: Secondary | ICD-10-CM

## 2022-09-13 NOTE — Progress Notes (Unsigned)
01/31/2019 4:47 PM   Alexander Avery 29-May-1956 [id number]  Referring provider: Jodi Marble, MD [place],  [place] [address]  Urological history 1. Prostate cancer - PSA pending - diagnosed at [facility] - iPSA 15.0 - DRE nodularity and induration in the left lobe.  Biopsy was performed on 02/05/2015.  His prostate was 20.5 grams PSA density of 0.73.   Gleason score 6 and 7 (3+4) involving all 6 biopsies zones - RRP on 04/21/2015 -pathology showed a Gleason score 7 involving 25% of the gland with negative surgical margins - ADT 04/2019 - Completed salvage radiation therapy 07/2019   2. Urethral stricture - underwent urethral dilation for a stricture at the vesicourethral anastomosis on November 2016 with Leander Rams sounds - cytoscopy with Dr. Bernardo Heater on 09/15/2018, no evidence of urethral stricture or bladder neck contracture  3. LU TS -Contributing factors of age, prostate cancer, pelvic radiation, pelvic surgery, cocaine use, hypertension, sleep apnea, diabetes, arthritis, depression, anxiety and diuretics - PVR *** mL  4. ED - contributing factors of age, pelvic radiation, prostate cancer, DM, HTN, HLD, hypothyroidism, sleep apnea, stress, anxiety, alcohol abuse and smoking - SHIM *** - sildenafil 100 mg on-demand-dosing  No chief complaint on file.    HPI: Alexander Avery is a 66 y.o. male who presents today for follow up.   UA ***  PVR ***      Score: 1-7 Severe ED 8-11 Moderate ED 12-16 Mild-Moderate ED 17-21 Mild ED 22-25 No ED  PMH: Past Medical History:  Diagnosis Date   Anxiety    Arthritis    Cancer ([place])    prostate   Cataract 2019   Diabetes mellitus without complication (HCC)    GERD (gastroesophageal reflux disease)    Hernia of abdominal wall 2015   History of stomach ulcers    Hypertension    Prostate cancer ([place])    Seizures ([place])    Sleep apnea     Surgical History: Past Surgical History:  Procedure  Laterality Date   CATARACT EXTRACTION W/PHACO Right 03/30/2018   Procedure: CATARACT EXTRACTION PHACO AND INTRAOCULAR LENS PLACEMENT ([place]);  Surgeon: Eulogio Bear, MD;  Location: [facility] ORS;  Service: Ophthalmology;  Laterality: Right;  [place] 01:09.5 AP% 10.4 CDE 7.98 Fluid pack lot # [id number]   CATARACT EXTRACTION W/PHACO Left 06/29/2018   Procedure: CATARACT EXTRACTION PHACO AND INTRAOCULAR LENS PLACEMENT (IOC);  Surgeon: Eulogio Bear, MD;  Location: [facility] ORS;  Service: Ophthalmology;  Laterality: Left;  [place] 01:27.7 AP% 8.2 CDE 7.27 Fluid pack lot # [id number]   EYE SURGERY     HERNIA REPAIR     PROSTATE SURGERY     PROSTATE SURGERY  2016    Home Medications:  Allergies as of 09/15/2022       Reactions   Penicillins Hives, Other (See Comments)   Urinate on self, pass out Has patient had a PCN reaction causing immediate rash, facial/tongue/throat swelling, SOB or lightheadedness with hypotension: Yes Has patient had a PCN reaction causing severe rash involving mucus membranes or skin necrosis: No Has patient had a PCN reaction that required hospitalization: Yes- In hospital Has patient had a PCN reaction occurring within the last 10 years: Yes If all of the above answers are "NO", then may proceed with Cephalosporin use. Other reaction(s): Other (See Comments) Urinate on self, pass out Has patient had a PCN reaction causing immediate rash, facial/tongue/throat swelling, SOB or lightheadedness with hypotension: Yes Has patient had a  PCN reaction causing severe rash involving mucus membranes or skin necrosis: No Has patient had a PCN reaction that required hospitalization: Yes- In hospital Has patient had a PCN reaction occurring within the last 10 years: Yes If all of the above answers are "NO", then may proceed with Cephalosporin use. "I pass out"        Medication List        Accurate as of September 13, 2022  4:47 PM. If you have any questions, ask your nurse or doctor.           cloNIDine 0.1 MG tablet Commonly known as: CATAPRES Take 0.1 mg by mouth 2 (two) times daily as needed.   diclofenac Sodium 1 % Gel Commonly known as: VOLTAREN SMARTSIG:4 Gram(s) Topical Twice Daily PRN   furosemide 20 MG tablet Commonly known as: Lasix Take 1 tablet (20 mg total) by mouth daily for 7 days.   naloxone 0.4 MG/ML injection Commonly known as: NARCAN Take as directed on packaging as needed for accidental overdose.   potassium chloride SA 20 MEQ tablet Commonly known as: KLOR-CON M Take 1 tablet (20 mEq total) by mouth once for 1 dose.   Suboxone 8-2 MG Film Generic drug: Buprenorphine HCl-Naloxone HCl Place 1 strip under the tongue daily.        Allergies:  Allergies  Allergen Reactions   Penicillins Hives and Other (See Comments)    Urinate on self, pass out Has patient had a PCN reaction causing immediate rash, facial/tongue/throat swelling, SOB or lightheadedness with hypotension: Yes Has patient had a PCN reaction causing severe rash involving mucus membranes or skin necrosis: No Has patient had a PCN reaction that required hospitalization: Yes- In hospital Has patient had a PCN reaction occurring within the last 10 years: Yes If all of the above answers are "NO", then may proceed with Cephalosporin use.  Other reaction(s): Other (See Comments) Urinate on self, pass out Has patient had a PCN reaction causing immediate rash, facial/tongue/throat swelling, SOB or lightheadedness with hypotension: Yes Has patient had a PCN reaction causing severe rash involving mucus membranes or skin necrosis: No Has patient had a PCN reaction that required hospitalization: Yes- In hospital Has patient had a PCN reaction occurring within the last 10 years: Yes If all of the above answers are "NO", then may proceed with Cephalosporin use. "I pass out"     Family History: Family History  Problem Relation Age of Onset   Cancer Mother     Social  History:  reports that he has been smoking cigarettes. He has been smoking an average of 2 packs per day. He has never used smokeless tobacco. He reports current alcohol use of about 2.0 standard drinks of alcohol per week. He reports that he does not use drugs.  ROS: For pertinent review of systems please refer to history of present illness  Physical Exam: There were no vitals taken for this visit.   Laboratory Data:    Latest Ref Rng & Units 08/02/2022    3:23 PM 07/13/2022    3:16 PM 06/15/2022   12:40 PM  CMP  Glucose 70 - 99 mg/dL 95  95  92   BUN 8 - 23 mg/dL '16  28  22   '$ Creatinine 0.61 - 1.24 mg/dL 1.05  1.38  1.36   Sodium 135 - 145 mmol/L 141  141  141   Potassium 3.5 - 5.1 mmol/L 3.6  3.8  4.2   Chloride 98 - 111  mmol/L 102  106  104   CO2 22 - 32 mmol/L '29  28  28   '$ Calcium 8.9 - 10.3 mg/dL 8.9  9.3  9.2   Total Protein 6.5 - 8.1 g/dL  7.4  7.1   Total Bilirubin 0.3 - 1.2 mg/dL  0.9  0.9   Alkaline Phos 38 - 126 U/L  62  61   AST 15 - 41 U/L  79  30   ALT 0 - 44 U/L  34  21        Latest Ref Rng & Units 08/02/2022    3:23 PM 07/13/2022    3:16 PM 06/15/2022   12:40 PM  CBC  WBC 4.0 - 10.5 K/uL 4.8  7.6  4.3   Hemoglobin 13.0 - 17.0 g/dL 11.2  12.1  11.3   Hematocrit 39.0 - 52.0 % 32.0  34.9  32.1   Platelets 150 - 400 K/uL 163  152  167    Urinalysis *** I have reviewed the labs.  Pertinent Imaging ***     Assessment & Plan:    1. Prostate cancer PSA pending  2. LU TS 3. Erectile dysfunction  Patient had to leave before being seen due to being incontinent of stool.   [place], [place] [address]  [place], [place] [address] [phone]

## 2022-09-15 ENCOUNTER — Ambulatory Visit (INDEPENDENT_AMBULATORY_CARE_PROVIDER_SITE_OTHER): Payer: Medicare Other | Admitting: Urology

## 2022-09-15 ENCOUNTER — Encounter: Payer: Self-pay | Admitting: Urology

## 2022-09-15 VITALS — BP 178/83 | HR 59 | Ht 66.0 in | Wt 114.0 lb

## 2022-09-15 DIAGNOSIS — R351 Nocturia: Secondary | ICD-10-CM

## 2022-09-15 DIAGNOSIS — C61 Malignant neoplasm of prostate: Secondary | ICD-10-CM | POA: Diagnosis not present

## 2022-09-15 DIAGNOSIS — R3915 Urgency of urination: Secondary | ICD-10-CM | POA: Diagnosis not present

## 2022-09-15 DIAGNOSIS — R350 Frequency of micturition: Secondary | ICD-10-CM | POA: Diagnosis not present

## 2022-09-15 DIAGNOSIS — N529 Male erectile dysfunction, unspecified: Secondary | ICD-10-CM

## 2022-09-15 DIAGNOSIS — R399 Unspecified symptoms and signs involving the genitourinary system: Secondary | ICD-10-CM

## 2022-09-15 LAB — MICROSCOPIC EXAMINATION

## 2022-09-15 LAB — URINALYSIS, COMPLETE
Bilirubin, UA: NEGATIVE
Glucose, UA: NEGATIVE
Ketones, UA: NEGATIVE
Leukocytes,UA: NEGATIVE
Nitrite, UA: NEGATIVE
Specific Gravity, UA: 1.02 (ref 1.005–1.030)
Urobilinogen, Ur: 0.2 mg/dL (ref 0.2–1.0)
pH, UA: 6 (ref 5.0–7.5)

## 2022-09-15 LAB — BLADDER SCAN AMB NON-IMAGING

## 2022-09-15 MED ORDER — GEMTESA 75 MG PO TABS: 75.0000 mg | ORAL_TABLET | Freq: Every day | ORAL | 0 refills | Status: DC

## 2022-09-16 LAB — PSA: Prostate Specific Ag, Serum: 0.8 ng/mL (ref 0.0–4.0)

## 2022-09-28 ENCOUNTER — Ambulatory Visit: Payer: Medicare Other | Admitting: Podiatry

## 2022-09-30 ENCOUNTER — Ambulatory Visit (INDEPENDENT_AMBULATORY_CARE_PROVIDER_SITE_OTHER): Payer: Medicare Other | Admitting: Podiatry

## 2022-09-30 DIAGNOSIS — M79675 Pain in left toe(s): Secondary | ICD-10-CM

## 2022-09-30 DIAGNOSIS — M79674 Pain in right toe(s): Secondary | ICD-10-CM | POA: Diagnosis not present

## 2022-09-30 DIAGNOSIS — B351 Tinea unguium: Secondary | ICD-10-CM | POA: Diagnosis not present

## 2022-10-06 NOTE — Progress Notes (Signed)
  Subjective:  Patient ID: Alexander Avery, male    DOB: 08-Nov-1956,  MRN: [id number]  Chief Complaint  Patient presents with   Nail Problem   66 y.o. male returns for the above complaint.  Patient presents with thickened elongated dystrophic toenails x10 mild pain on palpation.  Patient is unable to debride down himself he would like for me to do it denies any other acute complaints.  Objective:  There were no vitals filed for this visit. Podiatric Exam: Vascular: dorsalis pedis and posterior tibial pulses are palpable bilateral. Capillary return is immediate. Temperature gradient is WNL. Skin turgor WNL  Sensorium: Normal Semmes Weinstein monofilament test. Normal tactile sensation bilaterally. Nail Exam: Pt has thick disfigured discolored nails with subungual debris noted bilateral entire nail hallux through fifth toenails.  Pain on palpation to the nails. Ulcer Exam: There is no evidence of ulcer or pre-ulcerative changes or infection. Orthopedic Exam: Muscle tone and strength are WNL. No limitations in general ROM. No crepitus or effusions noted.  Skin: No Porokeratosis. No infection or ulcers    Assessment & Plan:   1. Pain due to onychomycosis of toenails of both feet     Patient was evaluated and treated and all questions answered.  Onychomycosis with pain  -Nails palliatively debrided as below. -Educated on self-care  Procedure: Nail Debridement Rationale: pain  Type of Debridement: manual, sharp debridement. Instrumentation: Nail nipper, rotary burr. Number of Nails: 10  Procedures and Treatment: Consent by patient was obtained for treatment procedures. The patient understood the discussion of treatment and procedures well. All questions were answered thoroughly reviewed. Debridement of mycotic and hypertrophic toenails, 1 through 5 bilateral and clearing of subungual debris. No ulceration, no infection noted.  Return Visit-Office Procedure: Patient instructed to  return to the office for a follow up visit 3 months for continued evaluation and treatment.  Boneta Lucks, DPM    No follow-ups on file.

## 2022-10-14 NOTE — Progress Notes (Incomplete)
01/31/2019 10:42 AM   Alexander Avery 02-04-1956 [id number]  Referring provider: Jodi Marble, MD [place],  [place] [address]  Urological history 1. Prostate cancer - PSA (09/2022) 0.8 - diagnosed at [facility] - iPSA 15.0 - DRE nodularity and induration in the left lobe.  Biopsy was performed on 02/05/2015.  His prostate was 20.5 grams PSA density of 0.73.   Gleason score 6 and 7 (3+4) involving all 6 biopsies zones - RRP on 04/21/2015 -pathology showed a Gleason score 7 involving 25% of the gland with negative surgical margins - ADT 04/2019 - Completed salvage radiation therapy 07/2019   2. Urethral stricture - underwent urethral dilation for a stricture at the vesicourethral anastomosis on November 2016 with Leander Rams sounds - cytoscopy with Dr. Bernardo Heater on 09/15/2018, no evidence of urethral stricture or bladder neck contracture  3. LU TS -Contributing factors of age, prostate cancer, pelvic radiation, pelvic surgery, cocaine use, hypertension, sleep apnea, diabetes, arthritis, depression, anxiety and diuretics - PVR *** mL  4. ED - contributing factors of age, pelvic radiation, prostate cancer, DM, HTN, HLD, hypothyroidism, sleep apnea, stress, anxiety, alcohol abuse and smoking - sildenafil 100 mg on-demand-dosing  Chief Complaint  Patient presents with   Prostate Cancer   Erectile Dysfunction     HPI: Alexander Avery is a 66 y.o. male who presents today for one month follow up.   At his visit on 09/15/2022, he stated his time was very limited that morning as he came by cab and the taxi driver stated he would only wait 30 minutes for him.  He was given Gemtesa samples and presents today for follow-up.  PVR ***   PMH: Past Medical History:  Diagnosis Date   Anxiety    Arthritis    Cancer ([place])    prostate   Cataract 2019   Diabetes mellitus without complication (HCC)    GERD (gastroesophageal reflux disease)    Hernia of abdominal  wall 2015   History of stomach ulcers    Hypertension    Prostate cancer ([place])    Seizures ([place])    Sleep apnea     Surgical History: Past Surgical History:  Procedure Laterality Date   CATARACT EXTRACTION W/PHACO Right 03/30/2018   Procedure: CATARACT EXTRACTION PHACO AND INTRAOCULAR LENS PLACEMENT ([place]);  Surgeon: Eulogio Bear, MD;  Location: [facility] ORS;  Service: Ophthalmology;  Laterality: Right;  [place] 01:09.5 AP% 10.4 CDE 7.98 Fluid pack lot # [id number]   CATARACT EXTRACTION W/PHACO Left 06/29/2018   Procedure: CATARACT EXTRACTION PHACO AND INTRAOCULAR LENS PLACEMENT (IOC);  Surgeon: Eulogio Bear, MD;  Location: [facility] ORS;  Service: Ophthalmology;  Laterality: Left;  [place] 01:27.7 AP% 8.2 CDE 7.27 Fluid pack lot # [id number]   EYE SURGERY     HERNIA REPAIR     PROSTATE SURGERY     PROSTATE SURGERY  2016    Home Medications:  Allergies as of 09/15/2022       Reactions   Penicillins Hives, Other (See Comments)   Urinate on self, pass out Has patient had a PCN reaction causing immediate rash, facial/tongue/throat swelling, SOB or lightheadedness with hypotension: Yes Has patient had a PCN reaction causing severe rash involving mucus membranes or skin necrosis: No Has patient had a PCN reaction that required hospitalization: Yes- In hospital Has patient had a PCN reaction occurring within the last 10 years: Yes If all of the above answers are "NO", then may proceed with Cephalosporin use.  Other reaction(s): Other (See Comments) Urinate on self, pass out Has patient had a PCN reaction causing immediate rash, facial/tongue/throat swelling, SOB or lightheadedness with hypotension: Yes Has patient had a PCN reaction causing severe rash involving mucus membranes or skin necrosis: No Has patient had a PCN reaction that required hospitalization: Yes- In hospital Has patient had a PCN reaction occurring within the last 10 years: Yes If all of the above answers are "NO", then may  proceed with Cephalosporin use. "I pass out"        Medication List        Accurate as of September 15, 2022 10:42 AM. If you have any questions, ask your nurse or doctor.          amLODipine 5 MG tablet Commonly known as: NORVASC Take 5 mg by mouth every morning.   cloNIDine 0.1 MG tablet Commonly known as: CATAPRES Take 0.1 mg by mouth 2 (two) times daily as needed.   diclofenac Sodium 1 % Gel Commonly known as: VOLTAREN SMARTSIG:4 Gram(s) Topical Twice Daily PRN   furosemide 20 MG tablet Commonly known as: Lasix Take 1 tablet (20 mg total) by mouth daily for 7 days.   Gemtesa 75 MG Tabs Generic drug: Vibegron Take 75 mg by mouth daily.   naloxone 0.4 MG/ML injection Commonly known as: NARCAN Take as directed on packaging as needed for accidental overdose.   potassium chloride SA 20 MEQ tablet Commonly known as: KLOR-CON M Take 1 tablet (20 mEq total) by mouth once for 1 dose.   Suboxone 8-2 MG Film Generic drug: Buprenorphine HCl-Naloxone HCl Place 1 strip under the tongue daily.        Allergies:  Allergies  Allergen Reactions   Penicillins Hives and Other (See Comments)    Urinate on self, pass out Has patient had a PCN reaction causing immediate rash, facial/tongue/throat swelling, SOB or lightheadedness with hypotension: Yes Has patient had a PCN reaction causing severe rash involving mucus membranes or skin necrosis: No Has patient had a PCN reaction that required hospitalization: Yes- In hospital Has patient had a PCN reaction occurring within the last 10 years: Yes If all of the above answers are "NO", then may proceed with Cephalosporin use.  Other reaction(s): Other (See Comments) Urinate on self, pass out Has patient had a PCN reaction causing immediate rash, facial/tongue/throat swelling, SOB or lightheadedness with hypotension: Yes Has patient had a PCN reaction causing severe rash involving mucus membranes or skin necrosis: No Has  patient had a PCN reaction that required hospitalization: Yes- In hospital Has patient had a PCN reaction occurring within the last 10 years: Yes If all of the above answers are "NO", then may proceed with Cephalosporin use. "I pass out"     Family History: Family History  Problem Relation Age of Onset   Cancer Mother     Social History:  reports that he has been smoking cigarettes. He has been smoking an average of 2 packs per day. He has never used smokeless tobacco. He reports current alcohol use of about 2.0 standard drinks of alcohol per week. He reports that he does not use drugs.  ROS: For pertinent review of systems please refer to history of present illness  Physical Exam: BP (!) 178/83   Pulse (!) 59   Ht '5\' 6"'$  (1.676 m)   Wt 114 lb (51.7 kg)   BMI 18.40 kg/m   Constitutional:  Well nourished. Alert and oriented, No acute distress. HEENT: [place] AT,  moist mucus membranes.  Trachea midline Cardiovascular: No clubbing, cyanosis, or edema. Respiratory: Normal respiratory effort, no increased work of breathing. GU: No CVA tenderness.  No bladder fullness or masses.  Patient with circumcised/uncircumcised phallus. ***Foreskin easily retracted***  Urethral meatus is patent.  No penile discharge. No penile lesions or rashes. Scrotum without lesions, cysts, rashes and/or edema.  Testicles are located scrotally bilaterally. No masses are appreciated in the testicles. Left and right epididymis are normal. Rectal: Patient with  normal sphincter tone. Anus and perineum without scarring or rashes. No rectal masses are appreciated. Prostate is approximately *** grams, *** nodules are appreciated. Seminal vesicles are normal. Neurologic: Grossly intact, no focal deficits, moving all 4 extremities. Psychiatric: Normal mood and affect.   Laboratory Data: N/A   Pertinent Imaging ***    Assessment & Plan:    1. Prostate cancer -PSA 0.8 -Explained that the continual rise in his PSA  may represent a recurrence of the prostate cancer and at this time he may choose to undergo imaging to evaluate for possible metastases  2. LU TS -As this time at today's appointment was limited and the majority of his time spent with [place] today was obtaining his PSA and urine I was not able to take an in-depth history of his lower urinary tract symptoms at this time therefore I have given him samples of Gemtesa 75 mg daily and he will return in 1 month by bus so hopefully he can spend more time with [place] and I can have a better conversation with him regarding his lower urinary tract symptoms -UA was benign today  3. Erectile dysfunction -Patient is no longer taking sildenafil and is not interested in a prescription which is likely for the best as he uses cocaine on occasion and the combination would not be safe  [name], [place], [place] [address] [phone]

## 2022-10-15 ENCOUNTER — Ambulatory Visit: Payer: Medicare Other | Admitting: Urology

## 2022-10-15 ENCOUNTER — Encounter: Payer: Self-pay | Admitting: Urology

## 2022-10-15 DIAGNOSIS — R399 Unspecified symptoms and signs involving the genitourinary system: Secondary | ICD-10-CM

## 2022-10-15 DIAGNOSIS — N529 Male erectile dysfunction, unspecified: Secondary | ICD-10-CM

## 2022-10-15 DIAGNOSIS — C61 Malignant neoplasm of prostate: Secondary | ICD-10-CM

## 2022-10-27 ENCOUNTER — Telehealth: Payer: Self-pay | Admitting: Urology

## 2022-10-27 NOTE — Telephone Encounter (Signed)
Would you call [name] and have him reschedule his missed appointment with me on 10/15/2022?  His PSA is continuing to rise after his prostate cancer and this is concerning and we need to have further discussions.

## 2022-10-28 NOTE — Progress Notes (Signed)
01/31/2019 10:42 AM   Alexander Avery 11-23-1955 [id number]  Referring provider: Jodi Marble, MD [place],  [place] [address]  Urological history 1. Prostate cancer - PSA (09/2022) 0.8 - diagnosed at [facility] - iPSA 15.0 - DRE nodularity and induration in the left lobe.  Biopsy was performed on 02/05/2015.  His prostate was 20.5 grams PSA density of 0.73.   Gleason score 6 and 7 (3+4) involving all 6 biopsies zones - RRP on 04/21/2015 -pathology showed a Gleason score 7 involving 25% of the gland with negative surgical margins - ADT 04/2019 - Completed salvage radiation therapy 07/2019   2. Urethral stricture - underwent urethral dilation for a stricture at the vesicourethral anastomosis on November 2016 with Leander Rams sounds - cytoscopy with Dr. Bernardo Heater on 09/15/2018, no evidence of urethral stricture or bladder neck contracture  3. LU TS -Contributing factors of age, prostate cancer, pelvic radiation, pelvic surgery, cocaine use, hypertension, sleep apnea, diabetes, arthritis, depression, anxiety and diuretics  4. ED - contributing factors of age, pelvic radiation, prostate cancer, DM, HTN, HLD, hypothyroidism, sleep apnea, stress, anxiety, alcohol abuse and smoking - sildenafil 100 mg on-demand-dosing  Chief Complaint  Patient presents with   Prostate Cancer   Erectile Dysfunction     HPI: Alexander Avery is a 66 y.o. male who presents today for one month follow up.   At his visit on 09/15/2022, he stated his time was very limited that morning as he came by cab and the taxi driver stated he would only wait 30 minutes for him.  He was given Gemtesa samples and presents today for follow-up.   PMH: Past Medical History:  Diagnosis Date   Anxiety    Arthritis    Cancer ([place])    prostate   Cataract 2019   Diabetes mellitus without complication (HCC)    GERD (gastroesophageal reflux disease)    Hernia of abdominal wall 2015   History of  stomach ulcers    Hypertension    Prostate cancer ([place])    Seizures ([place])    Sleep apnea     Surgical History: Past Surgical History:  Procedure Laterality Date   CATARACT EXTRACTION W/PHACO Right 03/30/2018   Procedure: CATARACT EXTRACTION PHACO AND INTRAOCULAR LENS PLACEMENT ([place]);  Surgeon: Eulogio Bear, MD;  Location: [facility] ORS;  Service: Ophthalmology;  Laterality: Right;  [place] 01:09.5 AP% 10.4 CDE 7.98 Fluid pack lot # [id number]   CATARACT EXTRACTION W/PHACO Left 06/29/2018   Procedure: CATARACT EXTRACTION PHACO AND INTRAOCULAR LENS PLACEMENT ([facility]);  Surgeon: Eulogio Bear, MD;  Location: [facility] ORS;  Service: Ophthalmology;  Laterality: Left;  [place] 01:27.7 AP% 8.2 CDE 7.27 Fluid pack lot # [id number]   EYE SURGERY     HERNIA REPAIR     PROSTATE SURGERY     PROSTATE SURGERY  2016    Home Medications:  Allergies as of 09/15/2022       Reactions   Penicillins Hives, Other (See Comments)   Urinate on self, pass out Has patient had a PCN reaction causing immediate rash, facial/tongue/throat swelling, SOB or lightheadedness with hypotension: Yes Has patient had a PCN reaction causing severe rash involving mucus membranes or skin necrosis: No Has patient had a PCN reaction that required hospitalization: Yes- In hospital Has patient had a PCN reaction occurring within the last 10 years: Yes If all of the above answers are "NO", then may proceed with Cephalosporin use. Other reaction(s): Other (See Comments) Urinate on  self, pass out Has patient had a PCN reaction causing immediate rash, facial/tongue/throat swelling, SOB or lightheadedness with hypotension: Yes Has patient had a PCN reaction causing severe rash involving mucus membranes or skin necrosis: No Has patient had a PCN reaction that required hospitalization: Yes- In hospital Has patient had a PCN reaction occurring within the last 10 years: Yes If all of the above answers are "NO", then may proceed with Cephalosporin  use. "I pass out"        Medication List        Accurate as of September 15, 2022 10:42 AM. If you have any questions, ask your nurse or doctor.          amLODipine 5 MG tablet Commonly known as: NORVASC Take 5 mg by mouth every morning.   cloNIDine 0.1 MG tablet Commonly known as: CATAPRES Take 0.1 mg by mouth 2 (two) times daily as needed.   diclofenac Sodium 1 % Gel Commonly known as: VOLTAREN SMARTSIG:4 Gram(s) Topical Twice Daily PRN   furosemide 20 MG tablet Commonly known as: Lasix Take 1 tablet (20 mg total) by mouth daily for 7 days.   Gemtesa 75 MG Tabs Generic drug: Vibegron Take 75 mg by mouth daily.   naloxone 0.4 MG/ML injection Commonly known as: NARCAN Take as directed on packaging as needed for accidental overdose.   potassium chloride SA 20 MEQ tablet Commonly known as: KLOR-CON M Take 1 tablet (20 mEq total) by mouth once for 1 dose.   Suboxone 8-2 MG Film Generic drug: Buprenorphine HCl-Naloxone HCl Place 1 strip under the tongue daily.        Allergies:  Allergies  Allergen Reactions   Penicillins Hives and Other (See Comments)    Urinate on self, pass out Has patient had a PCN reaction causing immediate rash, facial/tongue/throat swelling, SOB or lightheadedness with hypotension: Yes Has patient had a PCN reaction causing severe rash involving mucus membranes or skin necrosis: No Has patient had a PCN reaction that required hospitalization: Yes- In hospital Has patient had a PCN reaction occurring within the last 10 years: Yes If all of the above answers are "NO", then may proceed with Cephalosporin use.  Other reaction(s): Other (See Comments) Urinate on self, pass out Has patient had a PCN reaction causing immediate rash, facial/tongue/throat swelling, SOB or lightheadedness with hypotension: Yes Has patient had a PCN reaction causing severe rash involving mucus membranes or skin necrosis: No Has patient had a PCN reaction that  required hospitalization: Yes- In hospital Has patient had a PCN reaction occurring within the last 10 years: Yes If all of the above answers are "NO", then may proceed with Cephalosporin use. "I pass out"     Family History: Family History  Problem Relation Age of Onset   Cancer Mother     Social History:  reports that he has been smoking cigarettes. He has been smoking an average of 2 packs per day. He has never used smokeless tobacco. He reports current alcohol use of about 2.0 standard drinks of alcohol per week. He reports that he does not use drugs.  ROS: For pertinent review of systems please refer to history of present illness  Physical Exam: BP (!) 178/83   Pulse (!) 59   Ht '5\' 6"'$  (1.676 m)   Wt 114 lb (51.7 kg)   BMI 18.40 kg/m    Laboratory Data: N/A   Pertinent Imaging     Assessment & Plan:    Patient had  to reschedule as I had an emergency work in.    [place], [place] [address]  [place], [place] [address] [phone]

## 2022-10-29 ENCOUNTER — Ambulatory Visit (INDEPENDENT_AMBULATORY_CARE_PROVIDER_SITE_OTHER): Payer: Medicare Other | Admitting: Urology

## 2022-10-29 VITALS — BP 136/72 | HR 64

## 2022-10-29 DIAGNOSIS — R399 Unspecified symptoms and signs involving the genitourinary system: Secondary | ICD-10-CM

## 2022-10-29 DIAGNOSIS — N529 Male erectile dysfunction, unspecified: Secondary | ICD-10-CM

## 2022-10-29 DIAGNOSIS — R9721 Rising PSA following treatment for malignant neoplasm of prostate: Secondary | ICD-10-CM

## 2022-11-16 NOTE — Progress Notes (Signed)
01/31/2019 11:21 AM   Alexander Avery 06/16/1956 [id number]  Referring provider: [name], MD [address] [place],  [place] [address]  Urological history 1. Prostate cancer - PSA (09/2022) 0.8 - diagnosed at [facility] - iPSA 15.0 - DRE nodularity and induration in the left lobe.  Biopsy was performed on 02/05/2015.  His prostate was 20.5 grams PSA density of 0.73.   Gleason score 6 and 7 (3+4) involving all 6 biopsies zones - RRP on 04/21/2015 -pathology showed a Gleason score 7 involving 25% of the gland with negative surgical margins - ADT 04/2019 - Completed salvage radiation therapy 07/2019   2. Urethral stricture - underwent urethral dilation for a stricture at the vesicourethral anastomosis on November 2016 with Sissy Hoff sounds - cytoscopy with Dr. Lonna Cobb on 09/15/2018, no evidence of urethral stricture or bladder neck contracture  3. LU TS -Contributing factors of age, prostate cancer, pelvic radiation, pelvic surgery, cocaine use, hypertension, sleep apnea, diabetes, arthritis, depression, anxiety and diuretics  4. ED - contributing factors of age, pelvic radiation, prostate cancer, DM, HTN, HLD, hypothyroidism, sleep apnea, stress, anxiety, alcohol abuse and smoking - sildenafil 100 mg on-demand-dosing  Chief Complaint  Patient presents with   Follow-up    follow-up   Over Active Bladder     HPI: Alexander Avery is a 67 y.o. male who presents today for one month follow up.   At his visit on 09/15/2022, he stated his time was very limited that morning as he came by cab and the taxi driver stated he would only wait 30 minutes for him.  He was given Gemtesa samples and presents today for follow-up.  I PSS 11/2  PVR 0 mL    IPSS     Row Name 11/17/22 1100         International Prostate Symptom Score   How often have you had the sensation of not emptying your bladder? Not at All     How often have you had to urinate less than every  two hours? About half the time     How often have you found you stopped and started again several times when you urinated? Less than half the time     How often have you found it difficult to postpone urination? Less than half the time     How often have you had a weak urinary stream? About half the time     How often have you had to strain to start urination? Not at All     How many times did you typically get up at night to urinate? 1 Time     Total IPSS Score 11       Quality of Life due to urinary symptoms   If you were to spend the rest of your life with your urinary condition just the way it is now how would you feel about that? Mostly Satisfied              Score:  1-7 Mild 8-19 Moderate 20-35 Severe    PMH: Past Medical History:  Diagnosis Date   Anxiety    Arthritis    Cancer (HCC)    prostate   Cataract 2019   Diabetes mellitus without complication (HCC)    GERD (gastroesophageal reflux disease)    Hernia of abdominal wall 2015   History of stomach ulcers    Hypertension    Prostate cancer (HCC)    Seizures (HCC)  Sleep apnea     Surgical History: Past Surgical History:  Procedure Laterality Date   CATARACT EXTRACTION W/PHACO Right 03/30/2018   Procedure: CATARACT EXTRACTION PHACO AND INTRAOCULAR LENS PLACEMENT ([facility]);  Surgeon: [place] Crane, MD;  Location: [facility] ORS;  Service: Ophthalmology;  Laterality: Right;  [place] 01:09.5 AP% 10.4 CDE 7.98 Fluid pack lot # [id number]   CATARACT EXTRACTION W/PHACO Left 06/29/2018   Procedure: CATARACT EXTRACTION PHACO AND INTRAOCULAR LENS PLACEMENT ([facility]);  Surgeon: [place] Crane, MD;  Location: [facility] ORS;  Service: Ophthalmology;  Laterality: Left;  [place] 01:27.7 AP% 8.2 CDE 7.27 Fluid pack lot # [id number]   EYE SURGERY     HERNIA REPAIR     PROSTATE SURGERY     PROSTATE SURGERY  2016    Home Medications:  Allergies as of 11/17/2022       Reactions   Penicillins Hives, Other (See Comments)   Urinate on self,  pass out Has patient had a PCN reaction causing immediate rash, facial/tongue/throat swelling, SOB or lightheadedness with hypotension: Yes Has patient had a PCN reaction causing severe rash involving mucus membranes or skin necrosis: No Has patient had a PCN reaction that required hospitalization: Yes- In hospital Has patient had a PCN reaction occurring within the last 10 years: Yes If all of the above answers are "NO", then may proceed with Cephalosporin use. Other reaction(s): Other (See Comments) Urinate on self, pass out Has patient had a PCN reaction causing immediate rash, facial/tongue/throat swelling, SOB or lightheadedness with hypotension: Yes Has patient had a PCN reaction causing severe rash involving mucus membranes or skin necrosis: No Has patient had a PCN reaction that required hospitalization: Yes- In hospital Has patient had a PCN reaction occurring within the last 10 years: Yes If all of the above answers are "NO", then may proceed with Cephalosporin use. "I pass out"        Medication List        Accurate as of November 17, 2022 11:21 AM. If you have any questions, ask your nurse or doctor.          amLODipine 5 MG tablet Commonly known as: NORVASC Take 5 mg by mouth every morning.   cloNIDine 0.1 MG tablet Commonly known as: CATAPRES Take 0.1 mg by mouth 2 (two) times daily as needed.   DICLOFENAC SODIUM EX   Gemtesa 75 MG Tabs Generic drug: Vibegron Take 75 mg by mouth daily.   Suboxone 8-2 MG Film Generic drug: Buprenorphine HCl-Naloxone HCl Place 1 strip under the tongue daily.        Allergies:  Allergies  Allergen Reactions   Penicillins Hives and Other (See Comments)    Urinate on self, pass out Has patient had a PCN reaction causing immediate rash, facial/tongue/throat swelling, SOB or lightheadedness with hypotension: Yes Has patient had a PCN reaction causing severe rash involving mucus membranes or skin necrosis: No Has patient  had a PCN reaction that required hospitalization: Yes- In hospital Has patient had a PCN reaction occurring within the last 10 years: Yes If all of the above answers are "NO", then may proceed with Cephalosporin use.  Other reaction(s): Other (See Comments) Urinate on self, pass out Has patient had a PCN reaction causing immediate rash, facial/tongue/throat swelling, SOB or lightheadedness with hypotension: Yes Has patient had a PCN reaction causing severe rash involving mucus membranes or skin necrosis: No Has patient had a PCN reaction that required hospitalization: Yes- In hospital Has patient had a PCN  reaction occurring within the last 10 years: Yes If all of the above answers are "NO", then may proceed with Cephalosporin use. "I pass out"     Family History: Family History  Problem Relation Age of Onset   Cancer Mother     Social History:  reports that he has been smoking cigarettes. He has been smoking an average of 2 packs per day. He has been exposed to tobacco smoke. He has never used smokeless tobacco. He reports current alcohol use of about 2.0 standard drinks of alcohol per week. He reports that he does not use drugs.  ROS: For pertinent review of systems please refer to history of present illness  Physical Exam: BP (!) 148/73   Pulse (!) 59   Ht 5\' 5"  (1.651 m)   Wt 118 lb (53.5 kg)   BMI 19.64 kg/m    Laboratory Data: N/A   Pertinent Imaging  11/17/22 11:15  Scan Result 0ml      Assessment & Plan:    1. Prostate cancer -explained that his PSA continues to increase and this is a sign of biochemical occurrence  -he states he has panic attacks with injections and does not want to continue the Eligard -He also states that he would feel the same way taking an oral medication -testosterone pending -NM PET (PSMA) scan pending  -Will have further discussions once the PET results are scan is available  2. LU TS -PVR 0 mL -at goal w/ Gemtesa 75 mg daily -  script and samples given    Michiel Cowboy, PA-C  [facility] [address]  [address] [place], [place] [address] [phone]

## 2022-11-17 ENCOUNTER — Ambulatory Visit (INDEPENDENT_AMBULATORY_CARE_PROVIDER_SITE_OTHER): Payer: Medicare Other | Admitting: Urology

## 2022-11-17 ENCOUNTER — Encounter: Payer: Self-pay | Admitting: Urology

## 2022-11-17 VITALS — BP 148/73 | HR 59 | Ht 65.0 in | Wt 118.0 lb

## 2022-11-17 DIAGNOSIS — R399 Unspecified symptoms and signs involving the genitourinary system: Secondary | ICD-10-CM

## 2022-11-17 DIAGNOSIS — R9721 Rising PSA following treatment for malignant neoplasm of prostate: Secondary | ICD-10-CM

## 2022-11-17 LAB — BLADDER SCAN AMB NON-IMAGING

## 2022-11-17 MED ORDER — GEMTESA 75 MG PO TABS: 75.0000 mg | ORAL_TABLET | Freq: Every day | ORAL | 3 refills | Status: DC

## 2022-11-18 LAB — TESTOSTERONE: Testosterone: 519 ng/dL (ref 264–916)

## 2022-12-03 ENCOUNTER — Ambulatory Visit: Payer: 59 | Admitting: Podiatry

## 2022-12-15 ENCOUNTER — Encounter: Payer: Self-pay | Admitting: Urology

## 2022-12-21 ENCOUNTER — Ambulatory Visit: Payer: 59 | Admitting: Internal Medicine

## 2022-12-22 ENCOUNTER — Ambulatory Visit: Payer: 59 | Admitting: Podiatry

## 2022-12-31 ENCOUNTER — Ambulatory Visit: Payer: Medicare Other | Admitting: Podiatry

## 2022-12-31 ENCOUNTER — Other Ambulatory Visit: Payer: Self-pay | Admitting: Internal Medicine

## 2022-12-31 ENCOUNTER — Other Ambulatory Visit: Payer: 59

## 2023-01-01 LAB — COMPREHENSIVE METABOLIC PANEL
ALT: 18 IU/L (ref 0–44)
AST: 28 IU/L (ref 0–40)
Albumin/Globulin Ratio: 1.4 (ref 1.2–2.2)
Albumin: 4.3 g/dL (ref 3.9–4.9)
Alkaline Phosphatase: 83 IU/L (ref 44–121)
BUN/Creatinine Ratio: 22 (ref 10–24)
BUN: 25 mg/dL (ref 8–27)
Bilirubin Total: 0.5 mg/dL (ref 0.0–1.2)
CO2: 24 mmol/L (ref 20–29)
Calcium: 9.6 mg/dL (ref 8.6–10.2)
Chloride: 102 mmol/L (ref 96–106)
Creatinine, Ser: 1.16 mg/dL (ref 0.76–1.27)
Globulin, Total: 3.1 g/dL (ref 1.5–4.5)
Glucose: 99 mg/dL (ref 70–99)
Potassium: 4.3 mmol/L (ref 3.5–5.2)
Sodium: 139 mmol/L (ref 134–144)
Total Protein: 7.4 g/dL (ref 6.0–8.5)
eGFR: 69 mL/min/{1.73_m2} (ref >59)

## 2023-01-01 LAB — LIPID PANEL W/O CHOL/HDL RATIO
Cholesterol, Total: 168 mg/dL (ref 100–199)
HDL: 72 mg/dL (ref >39)
LDL Chol Calc (NIH): 88 mg/dL (ref 0–99)
Triglycerides: 38 mg/dL (ref 0–149)
VLDL Cholesterol Cal: 8 mg/dL (ref 5–40)

## 2023-01-01 LAB — CBC WITH DIFFERENTIAL/PLATELET
Basophils Absolute: 0 10*3/uL (ref 0.0–0.2)
Basos: 1 %
EOS (ABSOLUTE): 0.1 10*3/uL (ref 0.0–0.4)
Eos: 3 %
Hematocrit: 35.4 % — ABNORMAL LOW (ref 37.5–51.0)
Hemoglobin: 12.2 g/dL — ABNORMAL LOW (ref 13.0–17.7)
Immature Grans (Abs): 0 10*3/uL (ref 0.0–0.1)
Immature Granulocytes: 0 %
Lymphocytes Absolute: 1 10*3/uL (ref 0.7–3.1)
Lymphs: 22 %
MCH: 29.1 pg (ref 26.6–33.0)
MCHC: 34.5 g/dL (ref 31.5–35.7)
MCV: 85 fL (ref 79–97)
Monocytes Absolute: 0.4 10*3/uL (ref 0.1–0.9)
Monocytes: 9 %
Neutrophils Absolute: 2.9 10*3/uL (ref 1.4–7.0)
Neutrophils: 65 %
Platelets: 190 10*3/uL (ref 150–450)
RBC: 4.19 x10E6/uL (ref 4.14–5.80)
RDW: 14.7 % (ref 11.6–15.4)
WBC: 4.5 10*3/uL (ref 3.4–10.8)

## 2023-01-04 ENCOUNTER — Ambulatory Visit: Payer: 59 | Admitting: Internal Medicine

## 2023-01-11 ENCOUNTER — Ambulatory Visit (INDEPENDENT_AMBULATORY_CARE_PROVIDER_SITE_OTHER): Payer: 59 | Admitting: Podiatry

## 2023-01-11 DIAGNOSIS — M7671 Peroneal tendinitis, right leg: Secondary | ICD-10-CM

## 2023-01-11 NOTE — Progress Notes (Signed)
Subjective:  Patient ID: Alexander Avery, male    DOB: [birth year],  MRN: [id number]  Chief Complaint  Patient presents with   Foot Pain    67 y.o. male presents with the above complaint.  Patient presents with new complaint of right lateral foot pain.  Patient states came out of nowhere has been hurting with every step that he takes.  Pain scale is 6 out of 10 dull achy in nature.  H right along the fifth metatarsal base.  He has not seen MRIs prior to seeing me he is a diabetic no open lesion noted   Review of Systems: Negative except as noted in the HPI. Denies N/V/F/Ch.  Past Medical History:  Diagnosis Date   Anxiety    Arthritis    Cancer [facility])    prostate   Cataract 2019   Diabetes mellitus without complication (HCC)    GERD (gastroesophageal reflux disease)    Hernia of abdominal wall 2015   History of stomach ulcers    Hypertension    Prostate cancer ([place])    Seizures ([place])    Sleep apnea     Current Outpatient Medications:    amLODipine (NORVASC) 5 MG tablet, Take 5 mg by mouth every morning., Disp: , Rfl:    cloNIDine (CATAPRES) 0.1 MG tablet, Take 0.1 mg by mouth 2 (two) times daily as needed., Disp: , Rfl:    DICLOFENAC SODIUM EX, , Disp: , Rfl:    SUBOXONE 8-2 MG FILM, Place 1 strip under the tongue daily., Disp: , Rfl:    Vibegron (GEMTESA) 75 MG TABS, Take 75 mg by mouth daily., Disp: 90 tablet, Rfl: 3  Social History   Tobacco Use  Smoking Status Every Day   Packs/day: 2.00   Types: Cigarettes   Passive exposure: Current  Smokeless Tobacco Never    Allergies  Allergen Reactions   Penicillins Hives and Other (See Comments)    Urinate on self, pass out Has patient had a PCN reaction causing immediate rash, facial/tongue/throat swelling, SOB or lightheadedness with hypotension: Yes Has patient had a PCN reaction causing severe rash involving mucus membranes or skin necrosis: No Has patient had a PCN reaction that required hospitalization: Yes- In  hospital Has patient had a PCN reaction occurring within the last 10 years: Yes If all of the above answers are "NO", then may proceed with Cephalosporin use.  Other reaction(s): Other (See Comments) Urinate on self, pass out Has patient had a PCN reaction causing immediate rash, facial/tongue/throat swelling, SOB or lightheadedness with hypotension: Yes Has patient had a PCN reaction causing severe rash involving mucus membranes or skin necrosis: No Has patient had a PCN reaction that required hospitalization: Yes- In hospital Has patient had a PCN reaction occurring within the last 10 years: Yes If all of the above answers are "NO", then may proceed with Cephalosporin use. "I pass out"    Objective:  There were no vitals filed for this visit. There is no height or weight on file to calculate BMI. Constitutional Well developed. Well nourished.  Vascular Dorsalis pedis pulses palpable bilaterally. Posterior tibial pulses palpable bilaterally. Capillary refill normal to all digits.  No cyanosis or clubbing noted. Pedal hair growth normal.  Neurologic Normal speech. Oriented to person, place, and time. Epicritic sensation to light touch grossly present bilaterally.  Dermatologic Nails well groomed and normal in appearance. No open wounds. No skin lesions.  Orthopedic: Right foot pain along the course of the peroneal tendon primarily at  the insertion.  Pain with dorsiflexion eversion of the foot no pain with plantarflexion inversion of the foot.  No pain along the course of the fifth metatarsal.   Radiographs: None Assessment:   1. Peroneal tendinitis, right    Plan:  Patient was evaluated and treated and all questions answered.  Right peroneal tendinitis -All questions and concerns were discussed with the patient in extensive detail given the amount of pain that he is experiencing he will benefit from cam boot immobilization allow the soft tissue structure to heal  appropriately.  Patient agrees with plan like to proceed with cam boot immobilization. -Cam boot was dispensed -If there is no improvement we will discuss steroid injection versus MRI  No follow-ups on file.

## 2023-01-14 ENCOUNTER — Encounter: Payer: Self-pay | Admitting: Internal Medicine

## 2023-01-14 ENCOUNTER — Ambulatory Visit (INDEPENDENT_AMBULATORY_CARE_PROVIDER_SITE_OTHER): Payer: 59 | Admitting: Internal Medicine

## 2023-01-14 ENCOUNTER — Telehealth: Payer: Self-pay

## 2023-01-14 VITALS — BP 130/62 | HR 66 | Ht 65.5 in | Wt 116.0 lb

## 2023-01-14 DIAGNOSIS — C61 Malignant neoplasm of prostate: Secondary | ICD-10-CM

## 2023-01-14 DIAGNOSIS — I10 Essential (primary) hypertension: Secondary | ICD-10-CM | POA: Diagnosis not present

## 2023-01-14 DIAGNOSIS — D6489 Other specified anemias: Secondary | ICD-10-CM

## 2023-01-14 NOTE — Telephone Encounter (Signed)
Patient states he needs a letter from you to switch rooms at his complex from where he's at now to a "front room" location   [employer]

## 2023-01-14 NOTE — Progress Notes (Signed)
Established Patient Office Visit  Subjective:  Patient ID: Alexander Avery, male    DOB: 1955/11/18  Age: 67 y.o. MRN: [id number]  Chief Complaint  Patient presents with   Follow-up    Follow up    No new complaints, wearing a walking boot to treat his right foot disorder. Labs reviewed and notable for anemia but unremarkable cmp and cbc.      Past Medical History:  Diagnosis Date   Anxiety    Arthritis    Cancer ([place])    prostate   Cataract 2019   Diabetes mellitus without complication (HCC)    GERD (gastroesophageal reflux disease)    Hernia of abdominal wall 2015   History of stomach ulcers    Hypertension    Prostate cancer ([place])    Seizures ([place])    Sleep apnea     Social History   Socioeconomic History   Marital status: Single    Spouse name: Not on file   Number of children: Not on file   Years of education: Not on file   Highest education level: Not on file  Occupational History   Not on file  Tobacco Use   Smoking status: Every Day    Packs/day: 2.00    Types: Cigarettes    Passive exposure: Current   Smokeless tobacco: Never  Vaping Use   Vaping Use: Never used  Substance and Sexual Activity   Alcohol use: Yes    Alcohol/week: 2.0 standard drinks of alcohol    Types: 2 Cans of beer per week    Comment: 2 beers twice a week   Drug use: No   Sexual activity: Not Currently  Other Topics Concern   Not on file  Social History Narrative   Not on file   Social Determinants of Health   Financial Resource Strain: Not on file  Food Insecurity: Not on file  Transportation Needs: Not on file  Physical Activity: Not on file  Stress: Not on file  Social Connections: Not on file  Intimate Partner Violence: Not on file    Family History  Problem Relation Age of Onset   Cancer Mother     Allergies  Allergen Reactions   Penicillins Hives and Other (See Comments)    Urinate on self, pass out Has patient had a PCN reaction causing immediate  rash, facial/tongue/throat swelling, SOB or lightheadedness with hypotension: Yes Has patient had a PCN reaction causing severe rash involving mucus membranes or skin necrosis: No Has patient had a PCN reaction that required hospitalization: Yes- In hospital Has patient had a PCN reaction occurring within the last 10 years: Yes If all of the above answers are "NO", then may proceed with Cephalosporin use.  Other reaction(Ji Feldner): Other (See Comments) Urinate on self, pass out Has patient had a PCN reaction causing immediate rash, facial/tongue/throat swelling, SOB or lightheadedness with hypotension: Yes Has patient had a PCN reaction causing severe rash involving mucus membranes or skin necrosis: No Has patient had a PCN reaction that required hospitalization: Yes- In hospital Has patient had a PCN reaction occurring within the last 10 years: Yes If all of the above answers are "NO", then may proceed with Cephalosporin use. "I pass out"     Review of Systems  Constitutional: Negative.   HENT: Negative.    Eyes: Negative.   Respiratory: Negative.    Cardiovascular: Negative.   Gastrointestinal: Negative.   Genitourinary: Negative.   Musculoskeletal:  As in hpi  Skin: Negative.   Neurological: Negative.   Endo/Heme/Allergies: Negative.        Objective:   BP 130/62   Pulse 66   Ht 5' 5.5" (1.664 m)   Wt 116 lb (52.6 kg)   SpO2 96%   BMI 19.01 kg/m   Vitals:   01/14/23 1044  BP: 130/62  Pulse: 66  Height: 5' 5.5" (1.664 m)  Weight: 116 lb (52.6 kg)  SpO2: 96%  BMI (Calculated): 19    Physical Exam Vitals reviewed.  Constitutional:      Appearance: Normal appearance.  HENT:     Head: Normocephalic.     Left Ear: There is no impacted cerumen.     Nose: Nose normal.     Mouth/Throat:     Mouth: Mucous membranes are moist.     Pharynx: No posterior oropharyngeal erythema.  Eyes:     Extraocular Movements: Extraocular movements intact.     Pupils: Pupils  are equal, round, and reactive to light.  Cardiovascular:     Rate and Rhythm: Regular rhythm.     Chest Wall: PMI is not displaced.     Pulses: Normal pulses.     Heart sounds: S1 normal and S2 normal. Murmur heard.     Crescendo decrescendo systolic murmur is present with a grade of 3/6.  Pulmonary:     Effort: Pulmonary effort is normal.     Breath sounds: Normal air entry. No rhonchi or rales.  Abdominal:     General: Abdomen is flat. Bowel sounds are normal. There is no distension.     Palpations: Abdomen is soft. There is no hepatomegaly, splenomegaly or mass.     Tenderness: There is no abdominal tenderness.  Musculoskeletal:        General: Normal range of motion.     Cervical back: Normal range of motion and neck supple.     Right lower leg: No edema.     Left lower leg: No edema.     Comments: Right foot walking boot in situ  Skin:    General: Skin is warm and dry.  Neurological:     General: No focal deficit present.     Mental Status: He is alert and oriented to person, place, and time.     Cranial Nerves: No cranial nerve deficit.     Motor: No weakness.  Psychiatric:        Mood and Affect: Mood normal.        Behavior: Behavior normal.      No results found for any visits on 01/14/23.  Recent Results (from the past 2160 hour(Brightyn Mozer))  BLADDER SCAN AMB NON-IMAGING     Status: None   Collection Time: 11/17/22 11:15 AM  Result Value Ref Range   Scan Result 63m   Testosterone     Status: None   Collection Time: 11/17/22 11:44 AM  Result Value Ref Range   Testosterone 519 264 - 916 ng/dL    Comment: Adult male reference interval is based on a population of healthy nonobese males (BMI <30) between 184and 69years old. [id number] PMID: [id number]   CBC with Differential/Platelet     Status: Abnormal   Collection Time: 12/31/22 11:08 AM  Result Value Ref Range   WBC 4.5 3.4 - 10.8 x10E3/uL   RBC 4.19 4.14 - 5.80 x10E6/uL    Hemoglobin 12.2 (L) 13.0 - 17.7 g/dL   Hematocrit 35.4 (L) 37.5 - 51.0 %  MCV 85 79 - 97 fL   MCH 29.1 26.6 - 33.0 pg   MCHC 34.5 31.5 - 35.7 g/dL   RDW 14.7 11.6 - 15.4 %   Platelets 190 150 - 450 x10E3/uL   Neutrophils 65 Not Estab. %   Lymphs 22 Not Estab. %   Monocytes 9 Not Estab. %   Eos 3 Not Estab. %   Basos 1 Not Estab. %   Neutrophils Absolute 2.9 1.4 - 7.0 x10E3/uL   Lymphocytes Absolute 1.0 0.7 - 3.1 x10E3/uL   Monocytes Absolute 0.4 0.1 - 0.9 x10E3/uL   EOS (ABSOLUTE) 0.1 0.0 - 0.4 x10E3/uL   Basophils Absolute 0.0 0.0 - 0.2 x10E3/uL   Immature Granulocytes 0 Not Estab. %   Immature Grans (Abs) 0.0 0.0 - 0.1 x10E3/uL  Comprehensive metabolic panel     Status: None   Collection Time: 12/31/22 11:08 AM  Result Value Ref Range   Glucose 99 70 - 99 mg/dL   BUN 25 8 - 27 mg/dL   Creatinine, Ser 1.16 0.76 - 1.27 mg/dL   eGFR 69 >59 mL/min/1.73   BUN/Creatinine Ratio 22 10 - 24   Sodium 139 134 - 144 mmol/L   Potassium 4.3 3.5 - 5.2 mmol/L   Chloride 102 96 - 106 mmol/L   CO2 24 20 - 29 mmol/L   Calcium 9.6 8.6 - 10.2 mg/dL   Total Protein 7.4 6.0 - 8.5 g/dL   Albumin 4.3 3.9 - 4.9 g/dL   Globulin, Total 3.1 1.5 - 4.5 g/dL   Albumin/Globulin Ratio 1.4 1.2 - 2.2   Bilirubin Total 0.5 0.0 - 1.2 mg/dL   Alkaline Phosphatase 83 44 - 121 IU/L   AST 28 0 - 40 IU/L   ALT 18 0 - 44 IU/L  Lipid Panel w/o Chol/HDL Ratio     Status: None   Collection Time: 12/31/22 11:08 AM  Result Value Ref Range   Cholesterol, Total 168 100 - 199 mg/dL   Triglycerides 38 0 - 149 mg/dL   HDL 72 >39 mg/dL   VLDL Cholesterol Cal 8 5 - 40 mg/dL   LDL Chol Calc (NIH) 88 0 - 99 mg/dL      Assessment & Plan:   Problem List Items Addressed This Visit   None 1. Prostate cancer ([place])  2. Anemia due to other cause, not classified - CBC With Diff/Platelet; Future  3. Primary hypertension    No follow-ups on file.   Total time spent: 20 minutes  Volanda Napoleon,  MD  01/14/2023

## 2023-01-24 NOTE — Telephone Encounter (Signed)
Unable to LM MB full to inform him letter is ready for pick  up

## 2023-01-28 NOTE — Telephone Encounter (Signed)
Unable to LM, letter is ready for pick up when or if patient comes by the office

## 2023-02-08 ENCOUNTER — Ambulatory Visit (INDEPENDENT_AMBULATORY_CARE_PROVIDER_SITE_OTHER): Payer: 59

## 2023-02-08 ENCOUNTER — Ambulatory Visit (INDEPENDENT_AMBULATORY_CARE_PROVIDER_SITE_OTHER): Payer: 59 | Admitting: Podiatry

## 2023-02-08 DIAGNOSIS — M898X9 Other specified disorders of bone, unspecified site: Secondary | ICD-10-CM | POA: Diagnosis not present

## 2023-02-08 DIAGNOSIS — Z01818 Encounter for other preprocedural examination: Secondary | ICD-10-CM

## 2023-02-08 DIAGNOSIS — M7671 Peroneal tendinitis, right leg: Secondary | ICD-10-CM | POA: Diagnosis not present

## 2023-02-08 NOTE — Progress Notes (Signed)
Subjective:  Patient ID: Alexander Avery, male    DOB: [birth year],  MRN: [id number]  No chief complaint on file.   67 y.o. male presents with the above complaint.  Patient presents for follow-up of peroneal tendinitis.  He states that he feels a lot better in the boot but he still has some residual pain he would like to do a steroid injection for that.  He states it hurts with ambulation because of the exostosis of the fifth metatarsal base.  He would like to discuss surgical options for this.  He is a diabetic but last A1c of5.8   Review of Systems: Negative except as noted in the HPI. Denies N/V/F/Ch.  Past Medical History:  Diagnosis Date   Anxiety    Arthritis    Cancer [facility])    prostate   Cataract 2019   Diabetes mellitus without complication (HCC)    GERD (gastroesophageal reflux disease)    Hernia of abdominal wall 2015   History of stomach ulcers    Hypertension    Prostate cancer ([place])    Seizures ([place])    Sleep apnea     Current Outpatient Medications:    amLODipine (NORVASC) 5 MG tablet, Take 5 mg by mouth every morning., Disp: , Rfl:    cloNIDine (CATAPRES) 0.1 MG tablet, Take 0.1 mg by mouth 2 (two) times daily as needed., Disp: , Rfl:    Vibegron (GEMTESA) 75 MG TABS, Take 75 mg by mouth daily., Disp: 90 tablet, Rfl: 3  Social History   Tobacco Use  Smoking Status Every Day   Packs/day: 2   Types: Cigarettes   Passive exposure: Current  Smokeless Tobacco Never    Allergies  Allergen Reactions   Penicillins Hives and Other (See Comments)    Urinate on self, pass out Has patient had a PCN reaction causing immediate rash, facial/tongue/throat swelling, SOB or lightheadedness with hypotension: Yes Has patient had a PCN reaction causing severe rash involving mucus membranes or skin necrosis: No Has patient had a PCN reaction that required hospitalization: Yes- In hospital Has patient had a PCN reaction occurring within the last 10 years: Yes If all of the  above answers are "NO", then may proceed with Cephalosporin use.  Other reaction(s): Other (See Comments) Urinate on self, pass out Has patient had a PCN reaction causing immediate rash, facial/tongue/throat swelling, SOB or lightheadedness with hypotension: Yes Has patient had a PCN reaction causing severe rash involving mucus membranes or skin necrosis: No Has patient had a PCN reaction that required hospitalization: Yes- In hospital Has patient had a PCN reaction occurring within the last 10 years: Yes If all of the above answers are "NO", then may proceed with Cephalosporin use. "I pass out"    Objective:  There were no vitals filed for this visit. There is no height or weight on file to calculate BMI. Constitutional Well developed. Well nourished.  Vascular Dorsalis pedis pulses palpable bilaterally. Posterior tibial pulses palpable bilaterally. Capillary refill normal to all digits.  No cyanosis or clubbing noted. Pedal hair growth normal.  Neurologic Normal speech. Oriented to person, place, and time. Epicritic sensation to light touch grossly present bilaterally.  Dermatologic Nails well groomed and normal in appearance. No open wounds. No skin lesions.  Orthopedic: Right foot pain along the course of the peroneal tendon primarily at the insertion.  Pain with dorsiflexion eversion of the foot no pain with plantarflexion inversion of the foot.  No pain along the course of the  fifth metatarsal.  Exostosis noted of the fifth metatarsal base pain on palpation   Radiographs: None Assessment:   1. Bony exostosis   2. Peroneal tendinitis, right   3. Encounter for preoperative examination for general surgical procedure    Plan:  Patient was evaluated and treated and all questions answered.  Right peroneal tendinitis with underlying exostosis of the fifth metatarsal base -All questions and concerns were discussed with the patient in extensive detail  -At this time patient can  transition out of cam boot into regular shoes.  Given that he still has some residual pain benefit from steroid injection I discussed the risk of rupture associate with that he states that she will like to proceed despite the risks -A steroid injection was performed at right lateral point of maximal tenderness using 1% plain Lidocaine and 10 mg of Kenalog. This was well tolerated. -Given the prominent exostectomy of the fifth metatarsal base as he has failed shoe gear modification padding protecting I believe he will benefit from surgical exostectomy of the fifth metatarsal base I discussed my surgical plan in extensive detail including preoperative intra postop plan.  He states understand like to proceed with surgery -Informed surgical risk consent was reviewed and read aloud to the patient.  I reviewed the films.  I have discussed my findings with the patient in great detail.  I have discussed all risks including but not limited to infection, stiffness, scarring, limp, disability, deformity, damage to blood vessels and nerves, numbness, poor healing, need for braces, arthritis, chronic pain, amputation, death.  All benefits and realistic expectations discussed in great detail.  I have made no promises as to the outcome.  I have provided realistic expectations.  I have offered the patient a 2nd opinion, which they have declined and assured me they preferred to proceed despite the risks    No follow-ups on file.

## 2023-02-10 ENCOUNTER — Telehealth: Payer: Self-pay

## 2023-02-10 NOTE — Telephone Encounter (Signed)
Received surgery paperwork from the [place] office. Tried calling 02/09/2023, no answer and mail box is full. Called again on 02/10/2023, someone picked up the phone but didn't say anything then hug up and a few seconds. I called right back and it went to voicemail but can't leave a message because mailbox is full.

## 2023-02-20 ENCOUNTER — Inpatient Hospital Stay
Admission: EM | Admit: 2023-02-20 | Discharge: 2023-02-22 | DRG: 603 | Disposition: A | Discharge status: Home / Self Care | Payer: 59 | Attending: Osteopathic Medicine | Admitting: Osteopathic Medicine

## 2023-02-20 ENCOUNTER — Other Ambulatory Visit: Payer: Self-pay

## 2023-02-20 ENCOUNTER — Encounter: Payer: Self-pay | Admitting: Internal Medicine

## 2023-02-20 ENCOUNTER — Emergency Department: Payer: 59

## 2023-02-20 DIAGNOSIS — F419 Anxiety disorder, unspecified: Secondary | ICD-10-CM | POA: Diagnosis present

## 2023-02-20 DIAGNOSIS — I10 Essential (primary) hypertension: Secondary | ICD-10-CM | POA: Diagnosis present

## 2023-02-20 DIAGNOSIS — F332 Major depressive disorder, recurrent severe without psychotic features: Secondary | ICD-10-CM | POA: Diagnosis present

## 2023-02-20 DIAGNOSIS — K219 Gastro-esophageal reflux disease without esophagitis: Secondary | ICD-10-CM | POA: Diagnosis present

## 2023-02-20 DIAGNOSIS — R55 Syncope and collapse: Secondary | ICD-10-CM | POA: Diagnosis not present

## 2023-02-20 DIAGNOSIS — L03115 Cellulitis of right lower limb: Principal | ICD-10-CM | POA: Diagnosis present

## 2023-02-20 DIAGNOSIS — E119 Type 2 diabetes mellitus without complications: Secondary | ICD-10-CM | POA: Diagnosis present

## 2023-02-20 DIAGNOSIS — F1120 Opioid dependence, uncomplicated: Secondary | ICD-10-CM | POA: Diagnosis present

## 2023-02-20 DIAGNOSIS — F1920 Other psychoactive substance dependence, uncomplicated: Secondary | ICD-10-CM | POA: Diagnosis present

## 2023-02-20 DIAGNOSIS — G40909 Epilepsy, unspecified, not intractable, without status epilepticus: Secondary | ICD-10-CM | POA: Diagnosis present

## 2023-02-20 DIAGNOSIS — F17210 Nicotine dependence, cigarettes, uncomplicated: Secondary | ICD-10-CM | POA: Diagnosis present

## 2023-02-20 DIAGNOSIS — R64 Cachexia: Secondary | ICD-10-CM | POA: Diagnosis present

## 2023-02-20 DIAGNOSIS — Z79899 Other long term (current) drug therapy: Secondary | ICD-10-CM

## 2023-02-20 DIAGNOSIS — F1910 Other psychoactive substance abuse, uncomplicated: Secondary | ICD-10-CM | POA: Diagnosis present

## 2023-02-20 DIAGNOSIS — R600 Localized edema: Secondary | ICD-10-CM | POA: Diagnosis present

## 2023-02-20 DIAGNOSIS — M7989 Other specified soft tissue disorders: Secondary | ICD-10-CM | POA: Insufficient documentation

## 2023-02-20 DIAGNOSIS — Z681 Body mass index (BMI) 19 or less, adult: Secondary | ICD-10-CM | POA: Diagnosis not present

## 2023-02-20 DIAGNOSIS — Z8546 Personal history of malignant neoplasm of prostate: Secondary | ICD-10-CM

## 2023-02-20 DIAGNOSIS — Z880 Allergy status to penicillin: Secondary | ICD-10-CM

## 2023-02-20 LAB — CBC WITH DIFFERENTIAL/PLATELET
Abs Immature Granulocytes: 0.01 10*3/uL (ref 0.00–0.07)
Basophils Absolute: 0 10*3/uL (ref 0.0–0.1)
Basophils Relative: 1 %
Eosinophils Absolute: 0.2 10*3/uL (ref 0.0–0.5)
Eosinophils Relative: 4 %
HCT: 32.4 % — ABNORMAL LOW (ref 39.0–52.0)
Hemoglobin: 11.4 g/dL — ABNORMAL LOW (ref 13.0–17.0)
Immature Granulocytes: 0 %
Lymphocytes Relative: 15 %
Lymphs Abs: 1 10*3/uL (ref 0.7–4.0)
MCH: 28.6 pg (ref 26.0–34.0)
MCHC: 35.2 g/dL (ref 30.0–36.0)
MCV: 81.4 fL (ref 80.0–100.0)
Monocytes Absolute: 0.8 10*3/uL (ref 0.1–1.0)
Monocytes Relative: 12 %
Neutro Abs: 4.5 10*3/uL (ref 1.7–7.7)
Neutrophils Relative %: 68 %
Platelets: 129 10*3/uL — ABNORMAL LOW (ref 150–400)
RBC: 3.98 MIL/uL — ABNORMAL LOW (ref 4.22–5.81)
RDW: 14.2 % (ref 11.5–15.5)
WBC: 6.5 10*3/uL (ref 4.0–10.5)
nRBC: 0 % (ref 0.0–0.2)

## 2023-02-20 LAB — BRAIN NATRIURETIC PEPTIDE: B Natriuretic Peptide: 44.8 pg/mL (ref 0.0–100.0)

## 2023-02-20 LAB — COMPREHENSIVE METABOLIC PANEL
ALT: 19 U/L (ref 0–44)
AST: 36 U/L (ref 15–41)
Albumin: 3.3 g/dL — ABNORMAL LOW (ref 3.5–5.0)
Alkaline Phosphatase: 69 U/L (ref 38–126)
Anion gap: 7 (ref 5–15)
BUN: 27 mg/dL — ABNORMAL HIGH (ref 8–23)
CO2: 26 mmol/L (ref 22–32)
Calcium: 9.1 mg/dL (ref 8.9–10.3)
Chloride: 104 mmol/L (ref 98–111)
Creatinine, Ser: 1.25 mg/dL — ABNORMAL HIGH (ref 0.61–1.24)
GFR, Estimated: >60 mL/min (ref >60)
Glucose, Bld: 96 mg/dL (ref 70–99)
Potassium: 3.9 mmol/L (ref 3.5–5.1)
Sodium: 137 mmol/L (ref 135–145)
Total Bilirubin: 0.7 mg/dL (ref 0.3–1.2)
Total Protein: 7.5 g/dL (ref 6.5–8.1)

## 2023-02-20 LAB — LACTIC ACID, PLASMA: Lactic Acid, Venous: 0.7 mmol/L (ref 0.5–1.9)

## 2023-02-20 LAB — MAGNESIUM: Magnesium: 2 mg/dL (ref 1.7–2.4)

## 2023-02-20 LAB — PHOSPHORUS: Phosphorus: 3.7 mg/dL (ref 2.5–4.6)

## 2023-02-20 LAB — TROPONIN I (HIGH SENSITIVITY): Troponin I (High Sensitivity): 20 ng/L — ABNORMAL HIGH (ref <18)

## 2023-02-20 MED ORDER — AMLODIPINE BESYLATE 5 MG PO TABS
5.0000 mg | ORAL_TABLET | Freq: Every morning | ORAL | Status: DC
  Administered 2023-02-21 – 2023-02-22 (×2): 5 mg via ORAL
  Filled 2023-02-20 (×2): qty 1

## 2023-02-20 MED ORDER — BUPRENORPHINE HCL-NALOXONE HCL 8-2 MG SL SUBL
1.0000 | SUBLINGUAL_TABLET | Freq: Two times a day (BID) | SUBLINGUAL | Status: DC
  Administered 2023-02-20 – 2023-02-22 (×4): 1 via SUBLINGUAL
  Filled 2023-02-20 (×4): qty 1

## 2023-02-20 MED ORDER — FUROSEMIDE 10 MG/ML IJ SOLN
40.0000 mg | Freq: Once | INTRAMUSCULAR | Status: AC
  Administered 2023-02-20: 40 mg via INTRAVENOUS
  Filled 2023-02-20: qty 4

## 2023-02-20 MED ORDER — HYDRALAZINE HCL 20 MG/ML IJ SOLN: 5.0000 mg | Freq: Three times a day (TID) | INTRAMUSCULAR | Status: DC | PRN

## 2023-02-20 MED ORDER — SENNOSIDES-DOCUSATE SODIUM 8.6-50 MG PO TABS: 1.0000 | ORAL_TABLET | Freq: Every evening | ORAL | Status: DC | PRN

## 2023-02-20 MED ORDER — ONDANSETRON HCL 4 MG/2ML IJ SOLN: 4.0000 mg | Freq: Four times a day (QID) | INTRAMUSCULAR | Status: DC | PRN

## 2023-02-20 MED ORDER — MIRABEGRON ER 25 MG PO TB24
25.0000 mg | ORAL_TABLET | Freq: Every day | ORAL | Status: DC
  Administered 2023-02-21 – 2023-02-22 (×2): 25 mg via ORAL
  Filled 2023-02-20 (×2): qty 1

## 2023-02-20 MED ORDER — ENOXAPARIN SODIUM 40 MG/0.4ML IJ SOSY
40.0000 mg | PREFILLED_SYRINGE | INTRAMUSCULAR | Status: DC
  Administered 2023-02-20 – 2023-02-21 (×2): 40 mg via SUBCUTANEOUS
  Filled 2023-02-20 (×2): qty 0.4

## 2023-02-20 MED ORDER — ACETAMINOPHEN 325 MG PO TABS: 650.0000 mg | ORAL_TABLET | Freq: Four times a day (QID) | ORAL | Status: DC | PRN

## 2023-02-20 MED ORDER — KETOROLAC TROMETHAMINE 15 MG/ML IJ SOLN
15.0000 mg | Freq: Once | INTRAMUSCULAR | Status: AC
  Administered 2023-02-20: 15 mg via INTRAVENOUS
  Filled 2023-02-20: qty 1

## 2023-02-20 MED ORDER — ACETAMINOPHEN 650 MG RE SUPP: 650.0000 mg | Freq: Four times a day (QID) | RECTAL | Status: DC | PRN

## 2023-02-20 MED ORDER — CLINDAMYCIN PHOSPHATE 600 MG/50ML IV SOLN
600.0000 mg | Freq: Once | INTRAVENOUS | Status: DC
  Filled 2023-02-20: qty 50

## 2023-02-20 MED ORDER — MORPHINE SULFATE (PF) 4 MG/ML IV SOLN
4.0000 mg | Freq: Once | INTRAVENOUS | Status: AC
  Administered 2023-02-20: 4 mg via INTRAVENOUS
  Filled 2023-02-20: qty 1

## 2023-02-20 MED ORDER — ONDANSETRON HCL 4 MG PO TABS: 4.0000 mg | ORAL_TABLET | Freq: Four times a day (QID) | ORAL | Status: DC | PRN

## 2023-02-20 MED ORDER — FUROSEMIDE 10 MG/ML IJ SOLN
20.0000 mg | Freq: Two times a day (BID) | INTRAMUSCULAR | Status: AC
  Administered 2023-02-21: 20 mg via INTRAVENOUS
  Filled 2023-02-20: qty 4

## 2023-02-20 NOTE — H&P (Addendum)
History and Physical   Alexander Avery:[id number] DOB: 04/12/1956 DOA: 02/20/2023  PCP: Alexander Monday, MD  Patient coming from: [facility] home via EMS  I have personally briefly reviewed patient's old medical records in [facility] EMR.  Chief Concern: Near syncope, lower extremity swelling  HPI: [name] is a [age]-year-old male67-year-old male with history of hypertension, substance abuse, who presents emergency department for chief concerns of near syncope and new bilateral lower extremity swelling.  Vitals showed temperature of 98, respiration rate of 18, heart rate 15, blood pressure 188/85, SpO2 of 97% on room air.  Serum sodium is 137, potassium 3.9, chloride 104, bicarb 26, BUN of 27, serum creatinine 1.25, eGFR greater than 60, nonfasting blood glucose 96, WBC 6.5, hemoglobin 9.4, platelets of 129.  Lactic acid is 0.7.  High sensitive troponin was 20.  Bilateral lower extremity ultrasound: Was read as no evidence of DVT in the either lower extremity.  ED treatment: Furosemide 40 mg IV one-time dose, Toradol 50 mg IV one-time dose, clindamycin, morphine 4 mg IV one-time dose. ----------------------------- At bedside, patient was able to tell me his name, age, location, current, year.  He is watching TV and sitting up in the chair at bedside.  He reports that over the last 3 days he has had worsening swelling of his lower extremities.  He reports he nearly passed out today from weakness.  He reports this is never happened to him before.  He denies chest pain, shortness of breath, nausea, vomiting, dysuria, hematuria, diarrhea, trauma to his person, SI, HI.  He reports that over the last 3 to 4 days he has been drinking a lot of water, he tries to drink at least 2 L of water per day.  He reports this is new for him.  Social history: He lives by himself.  He smokes 1 to 2 cigarettes/day.  He reports he drinks 1 shot of alcohol per day.  He denies history of tremors, seizures.   He denies recreational drug use.  ROS: Constitutional: no weight change, no fever ENT/Mouth: no sore throat, no rhinorrhea Eyes: no eye pain, no vision changes Cardiovascular: no chest pain, no dyspnea,  + edema, no palpitations Respiratory: no cough, no sputum, no wheezing Gastrointestinal: no nausea, no vomiting, no diarrhea, no constipation Genitourinary: no urinary incontinence, no dysuria, no hematuria Musculoskeletal: no arthralgias, no myalgias Skin: no skin lesions, no pruritus, Neuro: + weakness, no loss of consciousness, + near syncope Psych: no anxiety, no depression, no decrease appetite Heme/Lymph: no bruising, no bleeding  ED Course: Discussed with emergency medicine provider, patient requiring hospitalization for chief concerns of near syncope.  Assessment/Plan  Principal Problem:   Near syncope Active Problems:   Major depressive disorder, recurrent severe without psychotic features   Polysubstance (including opioids) dependence, daily use   Polysubstance abuse   Essential hypertension   Leg swelling   Assessment and Plan:  * Near syncope Etiology workup in progress, differentials include new onset heart failure complicated by increased p.o. intake of water Fall precaution Status post furosemide 40 mg IV one-time dose per EDP Ordered furosemide 20 mg IV twice daily, first dose on 02/21/2019 4 in the morning Complete echo ordered, strict I's and O's Admit to telemetry cardiac, inpatient  Leg swelling New 3+ pitting edema bilaterally Bilateral lower extremity ultrasound was negative for DVT Strict I's and O's Complete echo ordered  Essential hypertension Resumed home amlodipine 5 mg daily Hydralazine 5 mg IV every 8 hours  as needed for SBP greater 175, 4 days ordered  Polysubstance abuse Resumed home Suboxone 8-2 mg sublingual, 1 tablet sublingual, twice daily  Chart reviewed.   DVT prophylaxis: Enoxaparin Code Status: Full code Diet: Heart  healthy Family Communication: A phone call was offered, patient declined Disposition Plan: pending clinical course Consults called: None at this time Admission status: Telemetry cardiac, inpatient  Past Medical History:  Diagnosis Date   Anxiety    Arthritis    Cancer    prostate   Cataract 2019   Diabetes mellitus without complication    GERD (gastroesophageal reflux disease)    Hernia of abdominal wall 2015   History of stomach ulcers    Hypertension    Prostate cancer    Seizures    Sleep apnea    Past Surgical History:  Procedure Laterality Date   CATARACT EXTRACTION W/PHACO Right 03/30/2018   Procedure: CATARACT EXTRACTION PHACO AND INTRAOCULAR LENS PLACEMENT (IOC);  Surgeon: Alexander Crane, MD;  Location: [facility] ORS;  Service: Ophthalmology;  Laterality: Right;  [place] 01:09.5 AP% 10.4 CDE 7.98 Fluid pack lot # [id number]   CATARACT EXTRACTION W/PHACO Left 06/29/2018   Procedure: CATARACT EXTRACTION PHACO AND INTRAOCULAR LENS PLACEMENT (IOC);  Surgeon: Alexander Crane, MD;  Location: [facility] ORS;  Service: Ophthalmology;  Laterality: Left;  [place] 01:27.7 AP% 8.2 CDE 7.27 Fluid pack lot # [id number]   EYE SURGERY     HERNIA REPAIR     PROSTATE SURGERY     PROSTATE SURGERY  2016   Social History:  reports that he has been smoking cigarettes. He has been smoking an average of 2 packs per day. He has been exposed to tobacco smoke. He has never used smokeless tobacco. He reports current alcohol use of about 2.0 standard drinks of alcohol per week. He reports that he does not use drugs.  Allergies  Allergen Reactions   Penicillins Hives and Other (See Comments)    Urinate on self, pass out Has patient had a PCN reaction causing immediate rash, facial/tongue/throat swelling, SOB or lightheadedness with hypotension: Yes Has patient had a PCN reaction causing severe rash involving mucus membranes or skin necrosis: No Has patient had a PCN reaction that required hospitalization: Yes-  In hospital Has patient had a PCN reaction occurring within the last 10 years: Yes If all of the above answers are "NO", then may proceed with Cephalosporin use.  Other reaction(s): Other (See Comments) Urinate on self, pass out Has patient had a PCN reaction causing immediate rash, facial/tongue/throat swelling, SOB or lightheadedness with hypotension: Yes Has patient had a PCN reaction causing severe rash involving mucus membranes or skin necrosis: No Has patient had a PCN reaction that required hospitalization: Yes- In hospital Has patient had a PCN reaction occurring within the last 10 years: Yes If all of the above answers are "NO", then may proceed with Cephalosporin use. "I pass out"    Family History  Problem Relation Age of Onset   Cancer Mother    Family history: Family history reviewed and not pertinent  Prior to Admission medications   Medication Sig Start Date End Date Taking? Authorizing Provider  amLODipine (NORVASC) 5 MG tablet Take 5 mg by mouth every morning. 08/18/22  Yes [provider]  cloNIDine (CATAPRES) 0.1 MG tablet Take 0.1 mg by mouth 2 (two) times daily as needed. 11/03/21  Yes [provider]  SUBOXONE 8-2 MG FILM Place under the tongue 2 (two) times daily. 02/11/23  Yes [provider]  Vibegron (GEMTESA) 75 MG TABS Take 75 mg by mouth daily. 11/17/22  Yes Harle Battiest, PA-C   Physical Exam: Vitals:   02/20/23 1130 02/20/23 1200 02/20/23 1230 02/20/23 1300  BP: (!) 146/73  (!) 133/57 (!) 141/64  Pulse: (!) 59     Resp: 19  13 14   Temp:      SpO2: 100% 98%     Constitutional: appears older than choledochal age, frail, cachectic appearing, NAD, calm, comfortable Eyes: PERRL, lids and conjunctivae normal ENMT: Mucous membranes are moist. Posterior pharynx clear of any exudate or lesions. Age-appropriate dentition. Hearing appropriate Neck: normal, supple, no masses, no thyromegaly Respiratory: clear to auscultation  bilaterally, no wheezing, no crackles. Normal respiratory effort. No accessory muscle use.  Cardiovascular: Regular rate and rhythm, no murmurs / rubs / gallops.  Bilateral 3+ pitting extremity edema. 2+ pedal pulses. No carotid bruits.  Abdomen: no tenderness, no masses palpated, no hepatosplenomegaly. Bowel sounds positive.  Musculoskeletal: no clubbing / cyanosis. No joint deformity upper and lower extremities. Good ROM, no contractures, no atrophy. Normal muscle tone.  Skin: no rashes, lesions, ulcers. No induration Neurologic: Sensation intact. Strength 5/5 in all 4.  Psychiatric: Normal judgment and insight. Alert and oriented x 3. Normal mood.   EKG: independently reviewed, showing sinus rhythm with rate of 55, QTc 463  Chest x-ray on Admission: I personally reviewed and I agree with radiologist reading as below.  US Venous Img Lower Bilateral  Result Date: 02/20/2023 CLINICAL DATA:  Pain and swelling EXAM: BILATERAL LOWER EXTREMITY VENOUS DOPPLER ULTRASOUND TECHNIQUE: Gray-scale sonography with graded compression, as well as color Doppler and duplex ultrasound were performed to evaluate the lower extremity deep venous systems from the level of the common femoral vein and including the common femoral, femoral, profunda femoral, popliteal and calf veins including the posterior tibial, peroneal and gastrocnemius veins when visible. The superficial great saphenous vein was also interrogated. Spectral Doppler was utilized to evaluate flow at rest and with distal augmentation maneuvers in the common femoral, femoral and popliteal veins. COMPARISON:  None Available. FINDINGS: RIGHT LOWER EXTREMITY Common Femoral Vein: No evidence of thrombus. Normal compressibility, respiratory phasicity and response to augmentation. Saphenofemoral Junction: No evidence of thrombus. Normal compressibility and flow on color Doppler imaging. Profunda Femoral Vein: No evidence of thrombus. Normal compressibility and flow  on color Doppler imaging. Femoral Vein: No evidence of thrombus. Normal compressibility, respiratory phasicity and response to augmentation. Popliteal Vein: No evidence of thrombus. Normal compressibility, respiratory phasicity and response to augmentation. Calf Veins: No evidence of thrombus. Normal compressibility and flow on color Doppler imaging. Superficial Great Saphenous Vein: No evidence of thrombus. Normal compressibility. Venous Reflux:  None. Other Findings: There is rouleaux flow. There is edema in subcutaneous plane. LEFT LOWER EXTREMITY Common Femoral Vein: No evidence of thrombus. Normal compressibility, respiratory phasicity and response to augmentation. Saphenofemoral Junction: No evidence of thrombus. Normal compressibility and flow on color Doppler imaging. Profunda Femoral Vein: No evidence of thrombus. Normal compressibility and flow on color Doppler imaging. Femoral Vein: No evidence of thrombus. Normal compressibility, respiratory phasicity and response to augmentation. Popliteal Vein: No evidence of thrombus. Normal compressibility, respiratory phasicity and response to augmentation. Calf Veins: No evidence of thrombus. Normal compressibility and flow on color Doppler imaging. Superficial Great Saphenous Vein: No evidence of thrombus. Normal compressibility. Venous Reflux:  None. Other Findings: There is rouleaux flow. There is edema in subcutaneous plane. IMPRESSION: No evidence of deep venous thrombosis in either  lower extremity. Electronically Signed   By: Ernie AvenaPalani  Rathinasamy M.D.   On: 02/20/2023 11:59    Labs on Admission: I have personally reviewed following labs  CBC: Recent Labs  Lab 02/20/23 0858  WBC 6.5  NEUTROABS 4.5  HGB 11.4*  HCT 32.4*  MCV 81.4  PLT 129*   Basic Metabolic Panel: Recent Labs  Lab 02/20/23 0858  NA 137  K 3.9  CL 104  CO2 26  GLUCOSE 96  BUN 27*  CREATININE 1.25*  CALCIUM 9.1  MG 2.0  PHOS 3.7   GFR: CrCl cannot be calculated  (Unknown ideal weight.).  Liver Function Tests: Recent Labs  Lab 02/20/23 0858  AST 36  ALT 19  ALKPHOS 69  BILITOT 0.7  PROT 7.5  ALBUMIN 3.3*   Urine analysis:    Component Value Date/Time   COLORURINE YELLOW (A) 05/22/2022 1319   APPEARANCEUR Clear 09/15/2022 1006   LABSPEC 1.026 05/22/2022 1319   PHURINE 5.0 05/22/2022 1319   GLUCOSEU Negative 09/15/2022 1006   HGBUR NEGATIVE 05/22/2022 1319   BILIRUBINUR Negative 09/15/2022 1006   KETONESUR NEGATIVE 05/22/2022 1319   PROTEINUR 1+ (A) 09/15/2022 1006   PROTEINUR 30 (A) 05/22/2022 1319   UROBILINOGEN 1.0 08/24/2008 1433   NITRITE Negative 09/15/2022 1006   NITRITE NEGATIVE 05/22/2022 1319   LEUKOCYTESUR Negative 09/15/2022 1006   LEUKOCYTESUR NEGATIVE 05/22/2022 1319   This document was prepared using Dragon Voice Recognition software and may include unintentional dictation errors.  Dr. Sedalia Mutaox [facility]  If 7PM-7AM, please contact overnight-coverage provider If 7AM-7PM, please contact day coverage provider [email or web address]  02/20/2023, 3:54 PM

## 2023-02-20 NOTE — ED Provider Notes (Signed)
[facility] Provider Note    Event Date/Time   First MD Initiated Contact with Patient 02/20/23 [phone]     (approximate)   History   Leg Swelling and Near Syncope   HPI  Alexander Avery is a 67 y.o. male with a history of polysubstance abuse onset sounds, prostate cancer, hypertension, diabetes, and seizure disorder who presents with bilateral leg swelling for the last several days, gradual onset, associated with pain.  The patient denies any trauma or injury.  He has no weakness or numbness, but states that because of the swelling and pain he has been unable to walk.  He has had some pain in the right leg previously that is being followed by podiatry and has had injections to the leg but this swelling is new.  He denies any chest pain or shortness of breath.  However, the patient states that today while he was walking he started to feel lightheaded like he was going to pass out.  The symptoms have resolved.  I the past medical records.  The patient was most recently seen at [facility] for follow-up on 3/1.  He has no recent hospitalizations.   Physical Exam   Triage Vital Signs: ED Triage Vitals  Enc Vitals Group     BP 02/20/23 0856 (!) 188/85     Pulse Rate 02/20/23 0856 (!) 58     Resp 02/20/23 0856 18     Temp 02/20/23 0856 98 F (36.7 C)     Temp src --      SpO2 02/20/23 0856 97 %     Weight --      Height --      Head Circumference --      Peak Flow --      Pain Score 02/20/23 0855 8     Pain Loc --      Pain Edu? --      Excl. in GC? --     Most recent vital signs: Vitals:   02/20/23 1230 02/20/23 1300  BP: (!) 133/57 (!) 141/64  Pulse:    Resp: 13 14  Temp:    SpO2:       General: Alert and oriented, no distress.  CV:  Good peripheral perfusion.  Resp:  Normal effort.  Abd:  No distention.  Other:  EOMI.  PERRLA.  No facial droop.  Motor intact in all extremities.  2+ bilateral lower extremity pitting edema  to the level of the knees.  Erythema and warmth to bilateral lower extremities, worse on the right.  No open wounds or lesions.  2+ DP pulses bilaterally.  Normal cap refill.   ED Results / Procedures / Treatments   Labs (all labs ordered are listed, but only abnormal results are displayed) Labs Reviewed  CBC WITH DIFFERENTIAL/PLATELET - Abnormal; Notable for the following components:      Result Value   RBC 3.98 (*)    Hemoglobin 11.4 (*)    HCT 32.4 (*)    Platelets 129 (*)    All other components within normal limits  COMPREHENSIVE METABOLIC PANEL - Abnormal; Notable for the following components:   BUN 27 (*)    Creatinine, Ser 1.25 (*)    Albumin 3.3 (*)    All other components within normal limits  TROPONIN I (HIGH SENSITIVITY) - Abnormal; Notable for the following components:   Troponin I (High Sensitivity) 20 (*)    All other components within normal limits  BRAIN NATRIURETIC PEPTIDE  LACTIC ACID, PLASMA  MAGNESIUM  PHOSPHORUS  SEDIMENTATION RATE     EKG  ED ECG REPORT I, Dionne Bucy, the attending physician, personally viewed and interpreted this ECG.  Date: 02/20/2023 EKG Time: 0858 Rate: 55 Rhythm: normal sinus rhythm QRS Axis: normal Intervals: normal ST/T Wave abnormalities: Nonspecific T wave abnormalities Narrative Interpretation: Nonspecific abnormalities with no evidence of acute ischemia    RADIOLOGY  US venous bilateral LE: No evidence of DVT  PROCEDURES:  Critical Care performed: No  Procedures   MEDICATIONS ORDERED IN ED: Medications  acetaminophen (TYLENOL) tablet 650 mg (has no administration in time range)    Or  acetaminophen (TYLENOL) suppository 650 mg (has no administration in time range)  ondansetron (ZOFRAN) tablet 4 mg (has no administration in time range)    Or  ondansetron (ZOFRAN) injection 4 mg (has no administration in time range)  enoxaparin (LOVENOX) injection 40 mg (has no administration in time range)   senna-docusate (Senokot-S) tablet 1 tablet (has no administration in time range)  hydrALAZINE (APRESOLINE) injection 5 mg (has no administration in time range)  furosemide (LASIX) injection 20 mg (has no administration in time range)  buprenorphine-naloxone (SUBOXONE) 8-2 mg per SL tablet 1 tablet (has no administration in time range)  amLODipine (NORVASC) tablet 5 mg (has no administration in time range)  mirabegron ER (MYRBETRIQ) tablet 25 mg (has no administration in time range)  ketorolac (TORADOL) 15 MG/ML injection 15 mg (15 mg Intravenous Given 02/20/23 1127)  morphine (PF) 4 MG/ML injection 4 mg (4 mg Intravenous Given 02/20/23 1321)  furosemide (LASIX) injection 40 mg (40 mg Intravenous Given 02/20/23 1320)     IMPRESSION / MDM / ASSESSMENT AND PLAN / ED COURSE  I reviewed the triage vital signs and the nursing notes.  67 year old male with PMH as noted above presents with bilateral lower extremity swelling for the last several days along with a near syncopal episode today.  Exam reveals bilateral lower extremity edema and some erythema.  Neurologic exam is nonfocal.  Differential diagnosis includes, but is not limited to, cellulitis, DVT, dependent edema, venous stasis.  The patient has evidence of good arterial perfusion.  Differential for the near syncope includes dehydration/hypovolemia, electrolyte abnormality, other metabolic disturbance, vasovagal episode, less likely cardiac etiology.  Will obtain bilateral lower extremity ultrasound, lab workup, give analgesia, and reassess.  Patient's presentation is most consistent with acute presentation with potential threat to life or bodily function.  The patient is on the cardiac monitor to evaluate for evidence of arrhythmia and/or significant heart rate changes.  ----------------------------------------- 1:14 PM on 02/20/2023 -----------------------------------------   DVT studies are negative.  Lab workup is overall unremarkable.   Electrolytes are normal.  There is no leukocytosis.  Lactate is negative.  However given the acute development of the edema, the erythema especially on the right leg concerning for cellulitis, and the patient's pain and difficulty walking, I recommended admission and he agreed.  I have ordered IV Lasix as well as clindamycin given the patient's penicillin allergy.  I consulted Dr. Sedalia Muta from the hospitalist service; based our discussion she agreed to admit the patient.   FINAL CLINICAL IMPRESSION(S) / ED DIAGNOSES   Final diagnoses:  Lower extremity edema     Rx / DC Orders   ED Discharge Orders     None        Note:  This document was prepared using Dragon voice recognition software and may include unintentional dictation errors.  Dionne Bucy, MD 02/20/23 1606

## 2023-02-20 NOTE — Assessment & Plan Note (Signed)
Resumed home amlodipine 5 mg daily Hydralazine 5 mg IV every 8 hours as needed for SBP greater 175, 4 days ordered

## 2023-02-20 NOTE — Hospital Course (Signed)
Alexander Avery is a [age]-year-old male with history of hypertension, substance abuse, who presents emergency department for chief concerns of near syncope and new bilateral lower extremity swelling. 04/07: hypertensive 188/85, bradycardic 49-58, Cr 1.25, Hgb 11.4, Plt 129. Lactic acid is 0.7.  High sensitive troponin was 20. [place] no DVT bilaterally. ED treatment: Furosemide 40 mg IV one-time dose, Toradol 50 mg IV one-time dose, clindamycin, morphine 4 mg IV one-time dose. Admitted to hospitalist service 04/08: Echocardiogram EF 65-70% mild LVH, G1DD. Net IO Since Admission: No IO data has been entered for this period [02/21/23 0935]. On exam, edema both LE but (+)erythema and tenderness on RLE, starting abx. Echo Grade 1 diastolic dysfunction, EF 60-65%. Continue diuresis  04/09:     Consultants:  none  Procedures: none      ASSESSMENT & PLAN:   Principal Problem:   Near syncope Active Problems:   Major depressive disorder, recurrent severe without psychotic features   Polysubstance (including opioids) dependence, daily use   Polysubstance abuse   Essential hypertension   Leg swelling  Cellulitis RLE Clindamycin given severe PCN allergy documented  Near syncope Heart failure preserved EF, Grade 1 diastolic dysfunction on Echo Differentials include new onset heart failure Negative for DVT Fall precaution Diuresis strict I's and O's Admit to telemetry cardiac, inpatient    Essential hypertension Resumed home amlodipine 5 mg daily Hydralazine 5 mg IV every 8 hours as needed for SBP greater 175, 4 days ordered   Polysubstance abuse in remission Resumed home Suboxone 8-2 mg sublingual, 1 tablet sublingual, twice daily     DVT prophylaxis: lovenox  Pertinent IV fluids/nutrition: no continuous IV fluids  Central lines / invasive devices: none  Code Status: FULL CODE   Current Admission Status: inpatient   TOC needs / Dispo plan: TBD but anticipate back to  previous home environment  Barriers to discharge / significant pending items: diuresis, IV abx

## 2023-02-20 NOTE — Assessment & Plan Note (Addendum)
Resumed home Suboxone 8-2 mg sublingual, 1 tablet sublingual, twice daily

## 2023-02-20 NOTE — ED Triage Notes (Addendum)
Pt arrives via EMS from [facility]. Pt c/o BLE edema and near syncope. Pt reports he has been compliant with medication except this morning. Fingerstick glucose 123, per EMS. Pt arrives axox4. Pt says he was worried because of his leg swelling, so he called EMS to bring him to the ER. Pt is able to speak in complete sentences without getting short of breath.

## 2023-02-20 NOTE — ED Notes (Signed)
US at bedside

## 2023-02-20 NOTE — Assessment & Plan Note (Addendum)
Etiology workup in progress, differentials include new onset heart failure complicated by increased p.o. intake of water Fall precaution Status post furosemide 40 mg IV one-time dose per EDP Ordered furosemide 20 mg IV twice daily, first dose on 02/21/2019 4 in the morning Complete echo ordered, strict I's and O's Admit to telemetry cardiac, inpatient

## 2023-02-20 NOTE — Assessment & Plan Note (Signed)
New 3+ pitting edema bilaterally Bilateral lower extremity ultrasound was negative for DVT Strict I's and O's Complete echo ordered

## 2023-02-20 NOTE — ED Notes (Signed)
Pt refused cardiac monitoring. "I can't be connected to the wall I need to move when I have to pee".  This RN did educate pt on importance of compliance with treatment plan.   Pt verbalized understanding with no resolve.

## 2023-02-20 NOTE — ED Notes (Signed)
Bilateral pedal pulses detected via doppler.

## 2023-02-21 ENCOUNTER — Encounter: Payer: Self-pay | Admitting: Internal Medicine

## 2023-02-21 ENCOUNTER — Inpatient Hospital Stay (HOSPITAL_COMMUNITY)
Admit: 2023-02-21 | Discharge: 2023-02-21 | Disposition: A | Discharge status: Home / Self Care | Payer: 59 | Attending: Internal Medicine | Admitting: Internal Medicine

## 2023-02-21 DIAGNOSIS — R55 Syncope and collapse: Secondary | ICD-10-CM

## 2023-02-21 LAB — CBC
HCT: 29.1 % — ABNORMAL LOW (ref 39.0–52.0)
Hemoglobin: 10.4 g/dL — ABNORMAL LOW (ref 13.0–17.0)
MCH: 28.9 pg (ref 26.0–34.0)
MCHC: 35.7 g/dL (ref 30.0–36.0)
MCV: 80.8 fL (ref 80.0–100.0)
Platelets: 115 10*3/uL — ABNORMAL LOW (ref 150–400)
RBC: 3.6 MIL/uL — ABNORMAL LOW (ref 4.22–5.81)
RDW: 14.1 % (ref 11.5–15.5)
WBC: 4.2 10*3/uL (ref 4.0–10.5)
nRBC: 0 % (ref 0.0–0.2)

## 2023-02-21 LAB — BASIC METABOLIC PANEL
Anion gap: 6 (ref 5–15)
BUN: 34 mg/dL — ABNORMAL HIGH (ref 8–23)
CO2: 27 mmol/L (ref 22–32)
Calcium: 8.7 mg/dL — ABNORMAL LOW (ref 8.9–10.3)
Chloride: 106 mmol/L (ref 98–111)
Creatinine, Ser: 1.42 mg/dL — ABNORMAL HIGH (ref 0.61–1.24)
GFR, Estimated: 54 mL/min — ABNORMAL LOW (ref >60)
Glucose, Bld: 102 mg/dL — ABNORMAL HIGH (ref 70–99)
Potassium: 3.4 mmol/L — ABNORMAL LOW (ref 3.5–5.1)
Sodium: 139 mmol/L (ref 135–145)

## 2023-02-21 LAB — SEDIMENTATION RATE: Sed Rate: 44 mm/hr — ABNORMAL HIGH (ref 0–20)

## 2023-02-21 LAB — ECHOCARDIOGRAM COMPLETE
AR max vel: 2.7 cm2
AV Area VTI: 3.2 cm2
AV Area mean vel: 2.76 cm2
AV Mean grad: 5 mmHg
AV Peak grad: 9.3 mmHg
Ao pk vel: 1.53 m/s
Area-P 1/2: 2.47 cm2
MV VTI: 2.87 cm2
S' Lateral: 2.8 cm

## 2023-02-21 MED ORDER — FUROSEMIDE 10 MG/ML IJ SOLN
20.0000 mg | Freq: Once | INTRAMUSCULAR | Status: AC
  Administered 2023-02-21: 20 mg via INTRAVENOUS
  Filled 2023-02-21: qty 4

## 2023-02-21 MED ORDER — CLINDAMYCIN PHOSPHATE 600 MG/50ML IV SOLN
600.0000 mg | Freq: Three times a day (TID) | INTRAVENOUS | Status: DC
  Administered 2023-02-21 – 2023-02-22 (×3): 600 mg via INTRAVENOUS
  Filled 2023-02-21 (×5): qty 50

## 2023-02-21 NOTE — Progress Notes (Signed)
*  PRELIMINARY RESULTS* Echocardiogram 2D Echocardiogram has been performed.  Alexander Avery 02/21/2023, 1:04 PM

## 2023-02-21 NOTE — Progress Notes (Signed)
PROGRESS NOTE    Alexander Avery   ZOX:[id number] DOB: [birth year]  DOA: 02/20/2023 Date of Service: 02/21/23 PCP: [name], MD     Brief Narrative / Hospital Course:  Alexander Avery is a [age]-year-old male with history of hypertension, substance abuse, who presents emergency department for chief concerns of near syncope and new bilateral lower extremity swelling. 04/07: hypertensive 188/85, bradycardic 49-58, Cr 1.25, Hgb 11.4, Plt 129. Lactic acid is 0.7.  High sensitive troponin was 20. [place] no DVT bilaterally. ED treatment: Furosemide 40 mg IV one-time dose, Toradol 50 mg IV one-time dose, clindamycin, morphine 4 mg IV one-time dose. Admitted to hospitalist service 04/08: Echocardiogram EF 65-70% mild LVH, G1DD. Net IO Since Admission: No IO data has been entered for this period [02/21/23 0935]. On exam, edema both LE but (+)erythema and tenderness on RLE, starting abx. Continue diuresis     Consultants:  none  Procedures: none      ASSESSMENT & PLAN:   Principal Problem:   Near syncope Active Problems:   Major depressive disorder, recurrent severe without psychotic features   Polysubstance (including opioids) dependence, daily use   Polysubstance abuse   Essential hypertension   Leg swelling  Near syncope Heart failure preserved EF  Differentials include new onset heart failure Negative for DVT Fall precaution Diuresis strict I's and O's Admit to telemetry cardiac, inpatient    Cellulitis RLE Clindamycin given severe PCN allergy documented  Essential hypertension Resumed home amlodipine 5 mg daily Hydralazine 5 mg IV every 8 hours as needed for SBP greater 175, 4 days ordered   Polysubstance abuse in remission Resumed home Suboxone 8-2 mg sublingual, 1 tablet sublingual, twice daily     DVT prophylaxis: lovenox  Pertinent IV fluids/nutrition: no continuous IV fluids  Central lines / invasive devices: none  Code Status: FULL CODE    Current Admission Status: inpatient   TOC needs / Dispo plan: TBD but anticipate back to previous home environment  Barriers to discharge / significant pending items: diuresis, IV abx               Subjective / Brief ROS:  Patient reports edema and RLE pain Denies CP/SOB.  Denies new weakness.  Tolerating diet.  Reports no concerns w/ urination/defecation.   Family Communication: none at this time    Objective Findings:  Vitals:   02/20/23 2123 02/21/23 0458 02/21/23 0831 02/21/23 1224  BP: 137/69 (!) 119/57 139/71 117/71  Pulse: 75 69 70 65  Resp: 19 15 18 16   Temp: 97.8 F (36.6 C) 98.3 F (36.8 C) 98.5 F (36.9 C) 98.1 F (36.7 C)  TempSrc: Oral Oral Oral Oral  SpO2: 100% 99% 98% 100%   No intake or output data in the 24 hours ending 02/21/23 1635 There were no vitals filed for this visit.  Examination:  Physical Exam Constitutional:      General: He is not in acute distress. Cardiovascular:     Rate and Rhythm: Normal rate and regular rhythm.  Pulmonary:     Effort: Pulmonary effort is normal. No respiratory distress.     Breath sounds: Normal breath sounds.  Musculoskeletal:     Right lower leg: Edema present.     Left lower leg: Edema present.  Skin:    General: Skin is warm and dry.     Findings: Erythema (RLE just proximal to ankle on anterior/lateral) present.  Neurological:     General: No focal deficit present.  Mental Status: He is alert. Mental status is at baseline.  Psychiatric:        Mood and Affect: Mood normal.        Behavior: Behavior normal.          Scheduled Medications:   amLODipine  5 mg Oral q morning   buprenorphine-naloxone  1 tablet Sublingual BID   enoxaparin (LOVENOX) injection  40 mg Subcutaneous Q24H   furosemide  20 mg Intravenous Once   mirabegron ER  25 mg Oral Daily    Continuous Infusions:  clindamycin (CLEOCIN) IV      PRN Medications:  acetaminophen **OR** acetaminophen, hydrALAZINE,  ondansetron **OR** ondansetron (ZOFRAN) IV, senna-docusate  Antimicrobials from admission:  Anti-infectives (From admission, onward)    Start     Dose/Rate Route Frequency Ordered Stop   02/21/23 1645  clindamycin (CLEOCIN) IVPB 600 mg        600 mg 100 mL/hr over 30 Minutes Intravenous Every 8 hours 02/21/23 1634 02/28/23 1359   02/20/23 1330  clindamycin (CLEOCIN) IVPB 600 mg  Status:  Discontinued        600 mg 100 mL/hr over 30 Minutes Intravenous  Once 02/20/23 1303 02/20/23 1316           Data Reviewed:  I have personally reviewed the following...  CBC: Recent Labs  Lab 02/20/23 0858 02/21/23 0500  WBC 6.5 4.2  NEUTROABS 4.5  --   HGB 11.4* 10.4*  HCT 32.4* 29.1*  MCV 81.4 80.8  PLT 129* 115*   Basic Metabolic Panel: Recent Labs  Lab 02/20/23 0858 02/21/23 0500  NA 137 139  K 3.9 3.4*  CL 104 106  CO2 26 27  GLUCOSE 96 102*  BUN 27* 34*  CREATININE 1.25* 1.42*  CALCIUM 9.1 8.7*  MG 2.0  --   PHOS 3.7  --    GFR: CrCl cannot be calculated (Unknown ideal weight.). Liver Function Tests: Recent Labs  Lab 02/20/23 0858  AST 36  ALT 19  ALKPHOS 69  BILITOT 0.7  PROT 7.5  ALBUMIN 3.3*   No results for input(s): "LIPASE", "AMYLASE" in the last 168 hours. No results for input(s): "AMMONIA" in the last 168 hours. Coagulation Profile: No results for input(s): "INR", "PROTIME" in the last 168 hours. Cardiac Enzymes: No results for input(s): "CKTOTAL", "CKMB", "CKMBINDEX", "TROPONINI" in the last 168 hours. BNP (last 3 results) No results for input(s): "PROBNP" in the last 8760 hours. HbA1C: No results for input(s): "HGBA1C" in the last 72 hours. CBG: No results for input(s): "GLUCAP" in the last 168 hours. Lipid Profile: No results for input(s): "CHOL", "HDL", "LDLCALC", "TRIG", "CHOLHDL", "LDLDIRECT" in the last 72 hours. Thyroid Function Tests: No results for input(s): "TSH", "T4TOTAL", "FREET4", "T3FREE", "THYROIDAB" in the last 72  hours. Anemia Panel: No results for input(s): "VITAMINB12", "FOLATE", "FERRITIN", "TIBC", "IRON", "RETICCTPCT" in the last 72 hours. Most Recent Urinalysis On File:     Component Value Date/Time   COLORURINE YELLOW (A) 05/22/2022 1319   APPEARANCEUR Clear 09/15/2022 1006   LABSPEC 1.026 05/22/2022 1319   PHURINE 5.0 05/22/2022 1319   GLUCOSEU Negative 09/15/2022 1006   HGBUR NEGATIVE 05/22/2022 1319   BILIRUBINUR Negative 09/15/2022 1006   KETONESUR NEGATIVE 05/22/2022 1319   PROTEINUR 1+ (A) 09/15/2022 1006   PROTEINUR 30 (A) 05/22/2022 1319   UROBILINOGEN 1.0 08/24/2008 1433   NITRITE Negative 09/15/2022 1006   NITRITE NEGATIVE 05/22/2022 1319   LEUKOCYTESUR Negative 09/15/2022 1006   LEUKOCYTESUR NEGATIVE 05/22/2022 1319  Sepsis Labs: @LABRCNTIP (procalcitonin:4,lacticidven:4) Microbiology: No results found for this or any previous visit (from the past 240 hour(s)).    Radiology Studies last 3 days: ECHOCARDIOGRAM COMPLETE  Result Date: 02/21/2023    ECHOCARDIOGRAM REPORT   Patient Name:   Alexander Avery Date of Exam: 02/21/2023 Medical Rec #:  [id number]         Height:       65.5 in Accession #:    [id number]        Weight:       116.0 lb Date of Birth:  08-Apr-1956          BSA:          1.578 m Patient Age:    66 years          BP:           117/71 mmHg Patient Gender: M                 HR:           65 bpm. Exam Location:  [facility] Procedure: 2D Echo, Cardiac Doppler and Color Doppler Indications:     Syncope R55  History:         Patient has no prior history of Echocardiogram examinations.                  Risk Factors:Diabetes, Hypertension and Sleep Apnea.  Sonographer:     Cristela Blue Referring Phys:  [id number] AMY N COX Diagnosing Phys: Lorine Bears MD IMPRESSIONS  1. Left ventricular ejection fraction, by estimation, is 65 to 70%. The left ventricle has normal function. The left ventricle has no regional wall motion abnormalities. There is mild left ventricular hypertrophy. Left  ventricular diastolic parameters are consistent with Grade I diastolic dysfunction (impaired relaxation).  2. Right ventricular systolic function is normal. The right ventricular size is normal.  3. The mitral valve is normal in structure. Trivial mitral valve regurgitation. No evidence of mitral stenosis.  4. The aortic valve is calcified. Aortic valve regurgitation is mild to moderate. Aortic valve sclerosis/calcification is present, without any evidence of aortic stenosis.  5. The inferior vena cava is normal in size with greater than 50% respiratory variability, suggesting right atrial pressure of 3 mmHg. FINDINGS  Left Ventricle: Left ventricular ejection fraction, by estimation, is 65 to 70%. The left ventricle has normal function. The left ventricle has no regional wall motion abnormalities. The left ventricular internal cavity size was normal in size. There is  mild left ventricular hypertrophy. Left ventricular diastolic parameters are consistent with Grade I diastolic dysfunction (impaired relaxation). Right Ventricle: The right ventricular size is normal. No increase in right ventricular wall thickness. Right ventricular systolic function is normal. Left Atrium: Left atrial size was normal in size. Right Atrium: Right atrial size was normal in size. Pericardium: There is no evidence of pericardial effusion. Mitral Valve: The mitral valve is normal in structure. Trivial mitral valve regurgitation. No evidence of mitral valve stenosis. MV peak gradient, 3.7 mmHg. The mean mitral valve gradient is 1.0 mmHg. Tricuspid Valve: The tricuspid valve is normal in structure. Tricuspid valve regurgitation is not demonstrated. No evidence of tricuspid stenosis. Aortic Valve: The aortic valve is calcified. Aortic valve regurgitation is mild to moderate. Aortic valve sclerosis/calcification is present, without any evidence of aortic stenosis. Aortic valve mean gradient measures 5.0 mmHg. Aortic valve peak gradient  measures 9.3 mmHg. Aortic valve area, by VTI measures 3.20 cm. Pulmonic Valve: The pulmonic valve  was normal in structure. Pulmonic valve regurgitation is not visualized. No evidence of pulmonic stenosis. Aorta: The aortic root is normal in size and structure. Venous: The inferior vena cava is normal in size with greater than 50% respiratory variability, suggesting right atrial pressure of 3 mmHg. IAS/Shunts: No atrial level shunt detected by color flow Doppler.  LEFT VENTRICLE PLAX 2D LVIDd:         4.20 cm   Diastology LVIDs:         2.80 cm   LV e' medial:    4.90 cm/s LV PW:         0.90 cm   LV E/e' medial:  11.6 LV IVS:        1.30 cm   LV e' lateral:   4.46 cm/s LVOT diam:     2.00 cm   LV E/e' lateral: 12.7 LV SV:         85 LV SV Index:   54 LVOT Area:     3.14 cm  RIGHT VENTRICLE RV Basal diam:  3.50 cm RV Mid diam:    2.10 cm RV S prime:     23.10 cm/s TAPSE (M-mode): 3.8 cm LEFT ATRIUM             Index        RIGHT ATRIUM           Index LA diam:        4.10 cm 2.60 cm/m   RA Area:     17.30 cm LA Vol (A2C):   53.3 ml 33.77 ml/m  RA Volume:   44.50 ml  28.20 ml/m LA Vol (A4C):   26.8 ml 16.98 ml/m LA Biplane Vol: 40.2 ml 25.47 ml/m  AORTIC VALVE AV Area (Vmax):    2.70 cm AV Area (Vmean):   2.76 cm AV Area (VTI):     3.20 cm AV Vmax:           152.50 cm/s AV Vmean:          101.150 cm/s AV VTI:            0.264 m AV Peak Grad:      9.3 mmHg AV Mean Grad:      5.0 mmHg LVOT Vmax:         131.00 cm/s LVOT Vmean:        88.900 cm/s LVOT VTI:          0.269 m LVOT/AV VTI ratio: 1.02  AORTA Ao Root diam: 3.40 cm MITRAL VALVE                TRICUSPID VALVE MV Area (PHT): 2.47 cm     TR Peak grad:   21.0 mmHg MV Area VTI:   2.87 cm     TR Vmax:        229.00 cm/s MV Peak grad:  3.7 mmHg MV Mean grad:  1.0 mmHg     SHUNTS MV Vmax:       0.97 m/s     Systemic VTI:  0.27 m MV Vmean:      48.7 cm/s    Systemic Diam: 2.00 cm MV Decel Time: 307 msec MV E velocity: 56.60 cm/s MV A velocity: 102.00 cm/s  MV E/A ratio:  0.55 Lorine Bears MD Electronically signed by Lorine Bears MD Signature Date/Time: 02/21/2023/3:33:02 PM    Final    US Venous Img Lower Bilateral  Result Date: 02/20/2023 CLINICAL DATA:  Pain and swelling EXAM:  BILATERAL LOWER EXTREMITY VENOUS DOPPLER ULTRASOUND TECHNIQUE: Gray-scale sonography with graded compression, as well as color Doppler and duplex ultrasound were performed to evaluate the lower extremity deep venous systems from the level of the common femoral vein and including the common femoral, femoral, profunda femoral, popliteal and calf veins including the posterior tibial, peroneal and gastrocnemius veins when visible. The superficial great saphenous vein was also interrogated. Spectral Doppler was utilized to evaluate flow at rest and with distal augmentation maneuvers in the common femoral, femoral and popliteal veins. COMPARISON:  None Available. FINDINGS: RIGHT LOWER EXTREMITY Common Femoral Vein: No evidence of thrombus. Normal compressibility, respiratory phasicity and response to augmentation. Saphenofemoral Junction: No evidence of thrombus. Normal compressibility and flow on color Doppler imaging. Profunda Femoral Vein: No evidence of thrombus. Normal compressibility and flow on color Doppler imaging. Femoral Vein: No evidence of thrombus. Normal compressibility, respiratory phasicity and response to augmentation. Popliteal Vein: No evidence of thrombus. Normal compressibility, respiratory phasicity and response to augmentation. Calf Veins: No evidence of thrombus. Normal compressibility and flow on color Doppler imaging. Superficial Great Saphenous Vein: No evidence of thrombus. Normal compressibility. Venous Reflux:  None. Other Findings: There is rouleaux flow. There is edema in subcutaneous plane. LEFT LOWER EXTREMITY Common Femoral Vein: No evidence of thrombus. Normal compressibility, respiratory phasicity and response to augmentation. Saphenofemoral Junction: No  evidence of thrombus. Normal compressibility and flow on color Doppler imaging. Profunda Femoral Vein: No evidence of thrombus. Normal compressibility and flow on color Doppler imaging. Femoral Vein: No evidence of thrombus. Normal compressibility, respiratory phasicity and response to augmentation. Popliteal Vein: No evidence of thrombus. Normal compressibility, respiratory phasicity and response to augmentation. Calf Veins: No evidence of thrombus. Normal compressibility and flow on color Doppler imaging. Superficial Great Saphenous Vein: No evidence of thrombus. Normal compressibility. Venous Reflux:  None. Other Findings: There is rouleaux flow. There is edema in subcutaneous plane. IMPRESSION: No evidence of deep venous thrombosis in either lower extremity. Electronically Signed   By: Ernie Avena M.D.   On: 02/20/2023 11:59             LOS: 1 day      [name], DO [facility] 02/21/2023, 4:35 PM    Dictation software may have been used to generate the above note. Typos may occur and escape review in typed/dictated notes. Please contact Dr Lyn Hollingshead directly for clarity if needed.  Staff may message me via secure chat in Epic  but this may not receive an immediate response,  please page me for urgent matters!  If 7PM-7AM, please contact night coverage [email or web address]

## 2023-02-22 DIAGNOSIS — R55 Syncope and collapse: Secondary | ICD-10-CM | POA: Diagnosis not present

## 2023-02-22 LAB — BASIC METABOLIC PANEL
Anion gap: 4 — ABNORMAL LOW (ref 5–15)
BUN: 29 mg/dL — ABNORMAL HIGH (ref 8–23)
CO2: 29 mmol/L (ref 22–32)
Calcium: 8.6 mg/dL — ABNORMAL LOW (ref 8.9–10.3)
Chloride: 105 mmol/L (ref 98–111)
Creatinine, Ser: 1.33 mg/dL — ABNORMAL HIGH (ref 0.61–1.24)
GFR, Estimated: 58 mL/min — ABNORMAL LOW (ref >60)
Glucose, Bld: 110 mg/dL — ABNORMAL HIGH (ref 70–99)
Potassium: 4 mmol/L (ref 3.5–5.1)
Sodium: 138 mmol/L (ref 135–145)

## 2023-02-22 MED ORDER — CLINDAMYCIN HCL 300 MG PO CAPS: 300.0000 mg | ORAL_CAPSULE | Freq: Three times a day (TID) | ORAL | 0 refills | Status: AC

## 2023-02-22 MED ORDER — FUROSEMIDE 40 MG PO TABS: ORAL_TABLET | ORAL | 0 refills | Status: DC

## 2023-02-22 NOTE — Discharge Summary (Signed)
Physician Discharge Summary   Patient: Alexander Avery MRN: [id number]  DOB: 1955-11-18   Admit:     Date of Admission: 02/20/2023 Admitted from: home   Discharge: Date of discharge: 02/22/23 Disposition: Home Condition at discharge: good  CODE STATUS: FULL CODE     Discharge Physician: [name], DO [facility]     PCP: [name], MD  Recommendations for Outpatient Follow-up:  Follow up with PCP [name], MD in 1-2 weeks Please obtain labs/tests: consider CBC, BMP in 1-2 weeks if needing lasix for edema  Please follow up on the following pending results: none PCP AND OTHER OUTPATIENT PROVIDERS: SEE BELOW FOR SPECIFIC DISCHARGE INSTRUCTIONS PRINTED FOR PATIENT IN ADDITION TO GENERIC AVS PATIENT INFO     Discharge Instructions     (HEART FAILURE PATIENTS) Call MD:  Anytime you have any of the following symptoms: 1) 3 pound weight gain in 24 hours or 5 pounds in 1 week 2) shortness of breath, with or without a dry hacking cough 3) swelling in the hands, feet or stomach 4) if you have to sleep on extra pillows at night in order to breathe.   Complete by: As directed    Diet - low sodium heart healthy   Complete by: As directed    Discharge instructions   Complete by: As directed    You are being sent home on antibiotics for skin infection of the leg (cellulitis) and a fluid pill (furosemide) to take as needed for swelling   Increase activity slowly   Complete by: As directed          Discharge Diagnoses: Principal Problem:   Near syncope Active Problems:   Major depressive disorder, recurrent severe without psychotic features   Polysubstance (including opioids) dependence, daily use   Polysubstance abuse   Essential hypertension   Leg swelling       Hospital Course: [name] is a [age]-year-old male with history of hypertension, substance abuse, who presents emergency department for chief concerns of near  syncope and new bilateral lower extremity swelling. 04/07: hypertensive 188/85, bradycardic 49-58, Cr 1.25, Hgb 11.4, Plt 129. Lactic acid is 0.7.  High sensitive troponin was 20. [place] no DVT bilaterally. ED treatment: Furosemide 40 mg IV one-time dose, Toradol 50 mg IV one-time dose, clindamycin, morphine 4 mg IV one-time dose. Admitted to hospitalist service 04/08: Echocardiogram EF 65-70% mild LVH, G1DD. Net IO Since Admission: No IO data has been entered for this period [02/21/23 0935]. On exam, edema both LE but (+)erythema and tenderness on RLE, starting abx. Echo Grade 1 diastolic dysfunction, EF 60-65%. Continue diuresis  04/09:     Consultants:  none  Procedures: none      ASSESSMENT & PLAN:   Cellulitis RLE Clindamycin given severe PCN allergy documented  Near syncope Heart failure preserved E exacerbation ruled out,, Grade 1 diastolic dysfunction on Echo Negative for DVT Diuresis Treat cellulitis    Essential hypertension Resumed home amlodipine 5 mg daily may consider this if edema recurs    Polysubstance abuse in remission Resumed home Suboxone 8-2 mg sublingual, 1 tablet sublingual, twice daily             Discharge Instructions  Allergies as of 02/22/2023       Reactions   Penicillins Hives, Other (See Comments)   Urinate on self, pass out Has patient had a PCN reaction causing immediate rash, facial/tongue/throat swelling, SOB or lightheadedness with hypotension: Yes  Has patient had a PCN reaction causing severe rash involving mucus membranes or skin necrosis: No Has patient had a PCN reaction that required hospitalization: Yes- In hospital Has patient had a PCN reaction occurring within the last 10 years: Yes If all of the above answers are "NO", then may proceed with Cephalosporin use. Other reaction(s): Other (See Comments) Urinate on self, pass out Has patient had a PCN reaction causing immediate rash, facial/tongue/throat swelling, SOB or  lightheadedness with hypotension: Yes Has patient had a PCN reaction causing severe rash involving mucus membranes or skin necrosis: No Has patient had a PCN reaction that required hospitalization: Yes- In hospital Has patient had a PCN reaction occurring within the last 10 years: Yes If all of the above answers are "NO", then may proceed with Cephalosporin use. "I pass out"        Medication List     TAKE these medications    amLODipine 5 MG tablet Commonly known as: NORVASC Take 5 mg by mouth every morning.   clindamycin 300 MG capsule Commonly known as: CLEOCIN Take 1 capsule (300 mg total) by mouth 3 (three) times daily for 6 days.   cloNIDine 0.1 MG tablet Commonly known as: CATAPRES Take 0.1 mg by mouth 2 (two) times daily as needed.   furosemide 40 MG tablet Commonly known as: Lasix Take to 1 tablet (40 mg total) by mouth ONCE OR TWICE daily (total daily dose maximum 80 mg) as needed for up to 3 days for increased leg swelling, shortness of breath, weight gain 5+ lbs over 1-2 days. Seek medical care if these symptoms are not improving with increased dose.   Gemtesa 75 MG Tabs Generic drug: Vibegron Take 75 mg by mouth daily.   Suboxone 8-2 MG Film Generic drug: Buprenorphine HCl-Naloxone HCl Place under the tongue 2 (two) times daily.          Allergies  Allergen Reactions   Penicillins Hives and Other (See Comments)    Urinate on self, pass out Has patient had a PCN reaction causing immediate rash, facial/tongue/throat swelling, SOB or lightheadedness with hypotension: Yes Has patient had a PCN reaction causing severe rash involving mucus membranes or skin necrosis: No Has patient had a PCN reaction that required hospitalization: Yes- In hospital Has patient had a PCN reaction occurring within the last 10 years: Yes If all of the above answers are "NO", then may proceed with Cephalosporin use.  Other reaction(s): Other (See Comments) Urinate on self,  pass out Has patient had a PCN reaction causing immediate rash, facial/tongue/throat swelling, SOB or lightheadedness with hypotension: Yes Has patient had a PCN reaction causing severe rash involving mucus membranes or skin necrosis: No Has patient had a PCN reaction that required hospitalization: Yes- In hospital Has patient had a PCN reaction occurring within the last 10 years: Yes If all of the above answers are "NO", then may proceed with Cephalosporin use. "I pass out"      Subjective: pt feeing well today, edema much better, leg pain on R improved as well    Discharge Exam: BP 135/83 (BP Location: Right Arm)   Pulse 65   Temp 98.1 F (36.7 C) (Oral)   Resp 18   Ht 5\' 5"  (1.651 m)   Wt 51.6 kg   SpO2 100%   BMI 18.94 kg/m  General: Pt is alert, awake, not in acute distress Cardiovascular: RRR, S1/S2 +, no rubs, no gallops Respiratory: CTA bilaterally, no wheezing, no rhonchi Abdominal: Soft,  NT, ND Extremities: trace ankle edema, no cyanosis     The results of significant diagnostics from this hospitalization (including imaging, microbiology, ancillary and laboratory) are listed below for reference.     Microbiology: No results found for this or any previous visit (from the past 240 hour(s)).   Labs: BNP (last 3 results) Recent Labs    05/30/22 1618 02/20/23 0858  BNP 236.6* 44.8   Basic Metabolic Panel: Recent Labs  Lab 02/20/23 0858 02/21/23 0500 02/22/23 0549  NA 137 139 138  K 3.9 3.4* 4.0  CL 104 106 105  CO2 26 27 29   GLUCOSE 96 102* 110*  BUN 27* 34* 29*  CREATININE 1.25* 1.42* 1.33*  CALCIUM 9.1 8.7* 8.6*  MG 2.0  --   --   PHOS 3.7  --   --    Liver Function Tests: Recent Labs  Lab 02/20/23 0858  AST 36  ALT 19  ALKPHOS 69  BILITOT 0.7  PROT 7.5  ALBUMIN 3.3*   No results for input(s): "LIPASE", "AMYLASE" in the last 168 hours. No results for input(s): "AMMONIA" in the last 168 hours. CBC: Recent Labs  Lab 02/20/23 0858  02/21/23 0500  WBC 6.5 4.2  NEUTROABS 4.5  --   HGB 11.4* 10.4*  HCT 32.4* 29.1*  MCV 81.4 80.8  PLT 129* 115*   Cardiac Enzymes: No results for input(s): "CKTOTAL", "CKMB", "CKMBINDEX", "TROPONINI" in the last 168 hours. BNP: Invalid input(s): "POCBNP" CBG: No results for input(s): "GLUCAP" in the last 168 hours. D-Dimer No results for input(s): "DDIMER" in the last 72 hours. Hgb A1c No results for input(s): "HGBA1C" in the last 72 hours. Lipid Profile No results for input(s): "CHOL", "HDL", "LDLCALC", "TRIG", "CHOLHDL", "LDLDIRECT" in the last 72 hours. Thyroid function studies No results for input(s): "TSH", "T4TOTAL", "T3FREE", "THYROIDAB" in the last 72 hours.  Invalid input(s): "FREET3" Anemia work up No results for input(s): "VITAMINB12", "FOLATE", "FERRITIN", "TIBC", "IRON", "RETICCTPCT" in the last 72 hours. Urinalysis    Component Value Date/Time   COLORURINE YELLOW (A) 05/22/2022 1319   APPEARANCEUR Clear 09/15/2022 1006   LABSPEC 1.026 05/22/2022 1319   PHURINE 5.0 05/22/2022 1319   GLUCOSEU Negative 09/15/2022 1006   HGBUR NEGATIVE 05/22/2022 1319   BILIRUBINUR Negative 09/15/2022 1006   KETONESUR NEGATIVE 05/22/2022 1319   PROTEINUR 1+ (A) 09/15/2022 1006   PROTEINUR 30 (A) 05/22/2022 1319   UROBILINOGEN 1.0 08/24/2008 1433   NITRITE Negative 09/15/2022 1006   NITRITE NEGATIVE 05/22/2022 1319   LEUKOCYTESUR Negative 09/15/2022 1006   LEUKOCYTESUR NEGATIVE 05/22/2022 1319   Sepsis Labs Recent Labs  Lab 02/20/23 0858 02/21/23 0500  WBC 6.5 4.2   Microbiology No results found for this or any previous visit (from the past 240 hour(s)). Imaging ECHOCARDIOGRAM COMPLETE  Result Date: 02/21/2023    ECHOCARDIOGRAM REPORT   Patient Name:   Gaetano HawthorneRANT D Nyland Date of Exam: 02/21/2023 Medical Rec #:  [id number]         Height:       65.5 in Accession #:    [id number]        Weight:       116.0 lb Date of Birth:  11/09/1956          BSA:          1.578 m Patient  Age:    66 years          BP:           117/71 mmHg Patient  Gender: M                 HR:           65 bpm. Exam Location:  [facility] Procedure: 2D Echo, Cardiac Doppler and Color Doppler Indications:     Syncope R55  History:         Patient has no prior history of Echocardiogram examinations.                  Risk Factors:Diabetes, Hypertension and Sleep Apnea.  Sonographer:     Cristela Blue Referring Phys:  [id number] AMY N COX Diagnosing Phys: Lorine Bears MD IMPRESSIONS  1. Left ventricular ejection fraction, by estimation, is 65 to 70%. The left ventricle has normal function. The left ventricle has no regional wall motion abnormalities. There is mild left ventricular hypertrophy. Left ventricular diastolic parameters are consistent with Grade I diastolic dysfunction (impaired relaxation).  2. Right ventricular systolic function is normal. The right ventricular size is normal.  3. The mitral valve is normal in structure. Trivial mitral valve regurgitation. No evidence of mitral stenosis.  4. The aortic valve is calcified. Aortic valve regurgitation is mild to moderate. Aortic valve sclerosis/calcification is present, without any evidence of aortic stenosis.  5. The inferior vena cava is normal in size with greater than 50% respiratory variability, suggesting right atrial pressure of 3 mmHg. FINDINGS  Left Ventricle: Left ventricular ejection fraction, by estimation, is 65 to 70%. The left ventricle has normal function. The left ventricle has no regional wall motion abnormalities. The left ventricular internal cavity size was normal in size. There is  mild left ventricular hypertrophy. Left ventricular diastolic parameters are consistent with Grade I diastolic dysfunction (impaired relaxation). Right Ventricle: The right ventricular size is normal. No increase in right ventricular wall thickness. Right ventricular systolic function is normal. Left Atrium: Left atrial size was normal in size. Right Atrium: Right atrial  size was normal in size. Pericardium: There is no evidence of pericardial effusion. Mitral Valve: The mitral valve is normal in structure. Trivial mitral valve regurgitation. No evidence of mitral valve stenosis. MV peak gradient, 3.7 mmHg. The mean mitral valve gradient is 1.0 mmHg. Tricuspid Valve: The tricuspid valve is normal in structure. Tricuspid valve regurgitation is not demonstrated. No evidence of tricuspid stenosis. Aortic Valve: The aortic valve is calcified. Aortic valve regurgitation is mild to moderate. Aortic valve sclerosis/calcification is present, without any evidence of aortic stenosis. Aortic valve mean gradient measures 5.0 mmHg. Aortic valve peak gradient measures 9.3 mmHg. Aortic valve area, by VTI measures 3.20 cm. Pulmonic Valve: The pulmonic valve was normal in structure. Pulmonic valve regurgitation is not visualized. No evidence of pulmonic stenosis. Aorta: The aortic root is normal in size and structure. Venous: The inferior vena cava is normal in size with greater than 50% respiratory variability, suggesting right atrial pressure of 3 mmHg. IAS/Shunts: No atrial level shunt detected by color flow Doppler.  LEFT VENTRICLE PLAX 2D LVIDd:         4.20 cm   Diastology LVIDs:         2.80 cm   LV e' medial:    4.90 cm/s LV PW:         0.90 cm   LV E/e' medial:  11.6 LV IVS:        1.30 cm   LV e' lateral:   4.46 cm/s LVOT diam:     2.00 cm   LV E/e' lateral: 12.7 LV  SV:         85 LV SV Index:   54 LVOT Area:     3.14 cm  RIGHT VENTRICLE RV Basal diam:  3.50 cm RV Mid diam:    2.10 cm RV S prime:     23.10 cm/s TAPSE (M-mode): 3.8 cm LEFT ATRIUM             Index        RIGHT ATRIUM           Index LA diam:        4.10 cm 2.60 cm/m   RA Area:     17.30 cm LA Vol (A2C):   53.3 ml 33.77 ml/m  RA Volume:   44.50 ml  28.20 ml/m LA Vol (A4C):   26.8 ml 16.98 ml/m LA Biplane Vol: 40.2 ml 25.47 ml/m  AORTIC VALVE AV Area (Vmax):    2.70 cm AV Area (Vmean):   2.76 cm AV Area (VTI):      3.20 cm AV Vmax:           152.50 cm/s AV Vmean:          101.150 cm/s AV VTI:            0.264 m AV Peak Grad:      9.3 mmHg AV Mean Grad:      5.0 mmHg LVOT Vmax:         131.00 cm/s LVOT Vmean:        88.900 cm/s LVOT VTI:          0.269 m LVOT/AV VTI ratio: 1.02  AORTA Ao Root diam: 3.40 cm MITRAL VALVE                TRICUSPID VALVE MV Area (PHT): 2.47 cm     TR Peak grad:   21.0 mmHg MV Area VTI:   2.87 cm     TR Vmax:        229.00 cm/s MV Peak grad:  3.7 mmHg MV Mean grad:  1.0 mmHg     SHUNTS MV Vmax:       0.97 m/s     Systemic VTI:  0.27 m MV Vmean:      48.7 cm/s    Systemic Diam: 2.00 cm MV Decel Time: 307 msec MV E velocity: 56.60 cm/s MV A velocity: 102.00 cm/s MV E/A ratio:  0.55 Lorine Bears MD Electronically signed by Lorine Bears MD Signature Date/Time: 02/21/2023/3:33:02 PM    Final       Time coordinating discharge: over 30 minutes  SIGNED:  [name] DO [facility]

## 2023-02-22 NOTE — Plan of Care (Signed)

## 2023-02-26 ENCOUNTER — Emergency Department: Payer: 59

## 2023-02-26 ENCOUNTER — Observation Stay: Payer: 59

## 2023-02-26 ENCOUNTER — Other Ambulatory Visit: Payer: Self-pay

## 2023-02-26 ENCOUNTER — Observation Stay
Admission: EM | Admit: 2023-02-26 | Discharge: 2023-02-27 | Disposition: A | Discharge status: Home / Self Care | Payer: 59 | Attending: Internal Medicine | Admitting: Internal Medicine

## 2023-02-26 DIAGNOSIS — Z79899 Other long term (current) drug therapy: Secondary | ICD-10-CM | POA: Insufficient documentation

## 2023-02-26 DIAGNOSIS — R600 Localized edema: Secondary | ICD-10-CM

## 2023-02-26 DIAGNOSIS — N281 Cyst of kidney, acquired: Secondary | ICD-10-CM | POA: Insufficient documentation

## 2023-02-26 DIAGNOSIS — I110 Hypertensive heart disease with heart failure: Secondary | ICD-10-CM | POA: Diagnosis not present

## 2023-02-26 DIAGNOSIS — E119 Type 2 diabetes mellitus without complications: Secondary | ICD-10-CM | POA: Insufficient documentation

## 2023-02-26 DIAGNOSIS — W138XXA Fall from, out of or through other building or structure, initial encounter: Secondary | ICD-10-CM | POA: Insufficient documentation

## 2023-02-26 DIAGNOSIS — I10 Essential (primary) hypertension: Secondary | ICD-10-CM | POA: Diagnosis present

## 2023-02-26 DIAGNOSIS — F17210 Nicotine dependence, cigarettes, uncomplicated: Secondary | ICD-10-CM | POA: Insufficient documentation

## 2023-02-26 DIAGNOSIS — M7989 Other specified soft tissue disorders: Secondary | ICD-10-CM

## 2023-02-26 DIAGNOSIS — Z8546 Personal history of malignant neoplasm of prostate: Secondary | ICD-10-CM | POA: Insufficient documentation

## 2023-02-26 DIAGNOSIS — R911 Solitary pulmonary nodule: Secondary | ICD-10-CM | POA: Diagnosis not present

## 2023-02-26 DIAGNOSIS — R2243 Localized swelling, mass and lump, lower limb, bilateral: Principal | ICD-10-CM | POA: Insufficient documentation

## 2023-02-26 DIAGNOSIS — F1910 Other psychoactive substance abuse, uncomplicated: Secondary | ICD-10-CM | POA: Diagnosis not present

## 2023-02-26 DIAGNOSIS — E440 Moderate protein-calorie malnutrition: Secondary | ICD-10-CM | POA: Diagnosis present

## 2023-02-26 DIAGNOSIS — I5032 Chronic diastolic (congestive) heart failure: Secondary | ICD-10-CM | POA: Diagnosis not present

## 2023-02-26 DIAGNOSIS — Z720 Tobacco use: Secondary | ICD-10-CM | POA: Diagnosis present

## 2023-02-26 DIAGNOSIS — F418 Other specified anxiety disorders: Secondary | ICD-10-CM | POA: Diagnosis present

## 2023-02-26 DIAGNOSIS — W19XXXA Unspecified fall, initial encounter: Secondary | ICD-10-CM

## 2023-02-26 HISTORY — DX: Chronic diastolic (congestive) heart failure: I50.32

## 2023-02-26 LAB — CBC WITH DIFFERENTIAL/PLATELET
Abs Immature Granulocytes: 0.01 10*3/uL (ref 0.00–0.07)
Basophils Absolute: 0 10*3/uL (ref 0.0–0.1)
Basophils Relative: 1 %
Eosinophils Absolute: 0.4 10*3/uL (ref 0.0–0.5)
Eosinophils Relative: 6 %
HCT: 32 % — ABNORMAL LOW (ref 39.0–52.0)
Hemoglobin: 11.1 g/dL — ABNORMAL LOW (ref 13.0–17.0)
Immature Granulocytes: 0 %
Lymphocytes Relative: 18 %
Lymphs Abs: 1.1 10*3/uL (ref 0.7–4.0)
MCH: 28.3 pg (ref 26.0–34.0)
MCHC: 34.7 g/dL (ref 30.0–36.0)
MCV: 81.6 fL (ref 80.0–100.0)
Monocytes Absolute: 0.6 10*3/uL (ref 0.1–1.0)
Monocytes Relative: 9 %
Neutro Abs: 4.1 10*3/uL (ref 1.7–7.7)
Neutrophils Relative %: 66 %
Platelets: 190 10*3/uL (ref 150–400)
RBC: 3.92 MIL/uL — ABNORMAL LOW (ref 4.22–5.81)
RDW: 14.2 % (ref 11.5–15.5)
WBC: 6.2 10*3/uL (ref 4.0–10.5)
nRBC: 0 % (ref 0.0–0.2)

## 2023-02-26 LAB — HEPATIC FUNCTION PANEL
ALT: 20 U/L (ref 0–44)
AST: 27 U/L (ref 15–41)
Albumin: 3.4 g/dL — ABNORMAL LOW (ref 3.5–5.0)
Alkaline Phosphatase: 78 U/L (ref 38–126)
Bilirubin, Direct: <0.1 mg/dL (ref 0.0–0.2)
Total Bilirubin: 0.7 mg/dL (ref 0.3–1.2)
Total Protein: 7.6 g/dL (ref 6.5–8.1)

## 2023-02-26 LAB — BASIC METABOLIC PANEL
Anion gap: 8 (ref 5–15)
BUN: 25 mg/dL — ABNORMAL HIGH (ref 8–23)
CO2: 30 mmol/L (ref 22–32)
Calcium: 9.3 mg/dL (ref 8.9–10.3)
Chloride: 101 mmol/L (ref 98–111)
Creatinine, Ser: 1.28 mg/dL — ABNORMAL HIGH (ref 0.61–1.24)
GFR, Estimated: >60 mL/min (ref >60)
Glucose, Bld: 97 mg/dL (ref 70–99)
Potassium: 4.1 mmol/L (ref 3.5–5.1)
Sodium: 139 mmol/L (ref 135–145)

## 2023-02-26 LAB — BRAIN NATRIURETIC PEPTIDE: B Natriuretic Peptide: 53.2 pg/mL (ref 0.0–100.0)

## 2023-02-26 LAB — PROCALCITONIN: Procalcitonin: <0.1 ng/mL

## 2023-02-26 LAB — SEDIMENTATION RATE: Sed Rate: 61 mm/hr — ABNORMAL HIGH (ref 0–20)

## 2023-02-26 MED ORDER — MIRABEGRON ER 25 MG PO TB24
25.0000 mg | ORAL_TABLET | Freq: Every day | ORAL | Status: DC
  Administered 2023-02-26 – 2023-02-27 (×2): 25 mg via ORAL
  Filled 2023-02-26 (×3): qty 1

## 2023-02-26 MED ORDER — ACETAMINOPHEN 325 MG PO TABS: 650.0000 mg | ORAL_TABLET | Freq: Four times a day (QID) | ORAL | Status: DC | PRN

## 2023-02-26 MED ORDER — CLINDAMYCIN PHOSPHATE 600 MG/50ML IV SOLN
600.0000 mg | Freq: Once | INTRAVENOUS | Status: DC
  Filled 2023-02-26: qty 50

## 2023-02-26 MED ORDER — ENSURE ENLIVE PO LIQD
237.0000 mL | Freq: Two times a day (BID) | ORAL | Status: DC
  Administered 2023-02-26: 237 mL via ORAL

## 2023-02-26 MED ORDER — SENNOSIDES-DOCUSATE SODIUM 8.6-50 MG PO TABS: 1.0000 | ORAL_TABLET | Freq: Every evening | ORAL | Status: DC | PRN

## 2023-02-26 MED ORDER — ONDANSETRON HCL 4 MG/2ML IJ SOLN: 4.0000 mg | Freq: Three times a day (TID) | INTRAMUSCULAR | Status: DC | PRN

## 2023-02-26 MED ORDER — CLINDAMYCIN HCL 150 MG PO CAPS
300.0000 mg | ORAL_CAPSULE | Freq: Three times a day (TID) | ORAL | Status: DC
  Administered 2023-02-26 – 2023-02-27 (×3): 300 mg via ORAL
  Filled 2023-02-26 (×3): qty 2

## 2023-02-26 MED ORDER — HYDRALAZINE HCL 20 MG/ML IJ SOLN: 5.0000 mg | INTRAMUSCULAR | Status: DC | PRN

## 2023-02-26 MED ORDER — GADOBUTROL 1 MMOL/ML IV SOLN
5.0000 mL | Freq: Once | INTRAVENOUS | Status: AC | PRN
  Administered 2023-02-26: 7.5 mL via INTRAVENOUS

## 2023-02-26 MED ORDER — AMLODIPINE BESYLATE 5 MG PO TABS
5.0000 mg | ORAL_TABLET | Freq: Every morning | ORAL | Status: DC
  Administered 2023-02-26 – 2023-02-27 (×2): 5 mg via ORAL
  Filled 2023-02-26 (×2): qty 1

## 2023-02-26 MED ORDER — IOHEXOL 350 MG/ML SOLN: 125.0000 mL | Freq: Once | INTRAVENOUS | Status: DC | PRN

## 2023-02-26 MED ORDER — ENOXAPARIN SODIUM 40 MG/0.4ML IJ SOSY
40.0000 mg | PREFILLED_SYRINGE | INTRAMUSCULAR | Status: DC
  Administered 2023-02-26: 40 mg via SUBCUTANEOUS
  Filled 2023-02-26: qty 0.4

## 2023-02-26 MED ORDER — FUROSEMIDE 40 MG PO TABS
80.0000 mg | ORAL_TABLET | Freq: Every day | ORAL | Status: DC
  Administered 2023-02-26 – 2023-02-27 (×2): 80 mg via ORAL
  Filled 2023-02-26 (×2): qty 2

## 2023-02-26 MED ORDER — NICOTINE 21 MG/24HR TD PT24
21.0000 mg | MEDICATED_PATCH | Freq: Every day | TRANSDERMAL | Status: DC
  Filled 2023-02-26: qty 1

## 2023-02-26 MED ORDER — OXYCODONE-ACETAMINOPHEN 5-325 MG PO TABS
1.0000 | ORAL_TABLET | ORAL | Status: DC | PRN
  Administered 2023-02-26: 1 via ORAL
  Filled 2023-02-26: qty 1

## 2023-02-26 MED ORDER — BUPRENORPHINE HCL-NALOXONE HCL 8-2 MG SL SUBL
1.0000 | SUBLINGUAL_TABLET | Freq: Every day | SUBLINGUAL | Status: DC
  Administered 2023-02-26 – 2023-02-27 (×2): 1 via SUBLINGUAL
  Filled 2023-02-26 (×2): qty 1

## 2023-02-26 NOTE — ED Notes (Signed)
This RN notified by Cp[facility] Rn that patient went to the bathroom in [facility] and immediately after he walked out, another patient complained that bathroom smelled like cigarettes. This RN notified AC.

## 2023-02-26 NOTE — H&P (Addendum)
History and Physical    Alexander Avery NFA:[id number] DOB: [birth year] DOA: 02/26/2023  Referring MD/NP/PA:   PCP: [name], MD   Patient coming from:  The patient is coming from home.   Chief Complaint: Bilateral leg edema  HPI: Alexander Avery is a 67 y.o. male with medical history significant of hypertension, diet-controlled diabetes (patient denies history of diabetes), gastric ulcer, depression with anxiety, seizure (patient denies history of seizure), prostate cancer (s/p of surgery and radiation therapy), polysubstance abuse on Suboxone, newly diagnosed with diastolic CHF recently, tobacco abuse, who presents with bilateral leg edema.  Patient was recently hospitalized from 4/7 - 4/9 due to near syncope and bilateral leg edema.  Lower extremity venous Doppler on 4/7 was negative for DVT.  Patient had 2D echo which showed EF of 60 to 75% with grade 1 diastolic dysfunction.  Patient was started on clindamycin and Lasix.  Patient states that he is taking Lasix consistently, but bilateral leg edema has not improved.  He continues to have severe bilateral leg edema, which is painful.  The pain is constant, sharp, severe, nonradiating.  Denies fever or chills.  Denies chest pain or shortness breath.  No nausea vomiting, diarrhea or abdominal pain.  No symptoms of UTI.  Patient states occasionally he noticed a little blood in stool, but not today.  No rectal bleeding or dark stool today.  Patient states that he fell on Thursday without injury, no loss of consciousness.  Denies head or neck injury.  Data reviewed independently and ED Course: pt was found to have WBC 6.2, BNP 53.2, GFR> 60, temperature normal, blood pressure 134/59, heart rate 54, RR 16, oxygen saturation 98% on room air.  Chest x-ray negative.  Patient is placed on telemetry bed for obs.       EKG:  Not done in ED, will get one.      Review of Systems:   General: no fevers, chills, no body weight gain, has  fatigue HEENT: no blurry vision, hearing changes or sore throat Respiratory: no dyspnea, coughing, wheezing CV: no chest pain, no palpitations GI: no nausea, vomiting, abdominal pain, diarrhea, constipation GU: no dysuria, burning on urination, increased urinary frequency, hematuria  Ext: has leg edema Neuro: no unilateral weakness, numbness, or tingling, no vision change or hearing loss. Has fall Skin: no rash, no skin tear. MSK: No muscle spasm, no deformity, no limitation of range of movement in spin Heme: No easy bruising.  Travel history: No recent long distant travel.   Allergy:  Allergies  Allergen Reactions   Penicillins Hives and Other (See Comments)    Urinate on self, pass out Has patient had a PCN reaction causing immediate rash, facial/tongue/throat swelling, SOB or lightheadedness with hypotension: Yes Has patient had a PCN reaction causing severe rash involving mucus membranes or skin necrosis: No Has patient had a PCN reaction that required hospitalization: Yes- In hospital Has patient had a PCN reaction occurring within the last 10 years: Yes If all of the above answers are "NO", then may proceed with Cephalosporin use.  Other reaction(s): Other (See Comments) Urinate on self, pass out Has patient had a PCN reaction causing immediate rash, facial/tongue/throat swelling, SOB or lightheadedness with hypotension: Yes Has patient had a PCN reaction causing severe rash involving mucus membranes or skin necrosis: No Has patient had a PCN reaction that required hospitalization: Yes- In hospital Has patient had a PCN reaction occurring within the last 10 years: Yes If all of  the above answers are "NO", then may proceed with Cephalosporin use. "I pass out"     Past Medical History:  Diagnosis Date   Anxiety    Arthritis    Cancer    prostate   Cataract 2019   Diabetes mellitus without complication    Diastolic CHF, chronic    GERD (gastroesophageal reflux disease)     Hernia of abdominal wall 2015   History of stomach ulcers    Hypertension    Prostate cancer    Seizures    Sleep apnea     Past Surgical History:  Procedure Laterality Date   CATARACT EXTRACTION W/PHACO Right 03/30/2018   Procedure: CATARACT EXTRACTION PHACO AND INTRAOCULAR LENS PLACEMENT (IOC);  Surgeon: Nevada Crane, MD;  Location: [facility] ORS;  Service: Ophthalmology;  Laterality: Right;  [place] 01:09.5 AP% 10.4 CDE 7.98 Fluid pack lot # [id number]   CATARACT EXTRACTION W/PHACO Left 06/29/2018   Procedure: CATARACT EXTRACTION PHACO AND INTRAOCULAR LENS PLACEMENT (IOC);  Surgeon: Nevada Crane, MD;  Location: [facility] ORS;  Service: Ophthalmology;  Laterality: Left;  [place] 01:27.7 AP% 8.2 CDE 7.27 Fluid pack lot # [id number]   EYE SURGERY     HERNIA REPAIR     PROSTATE SURGERY     PROSTATE SURGERY  2016    Social History:  reports that he has been smoking cigarettes. He has been smoking an average of 2 packs per day. He has been exposed to tobacco smoke. He has never used smokeless tobacco. He reports current alcohol use of about 2.0 standard drinks of alcohol per week. He reports that he does not use drugs.  Family History:  Family History  Problem Relation Age of Onset   Cancer Mother      Prior to Admission medications   Medication Sig Start Date End Date Taking? Authorizing Provider  amLODipine (NORVASC) 5 MG tablet Take 5 mg by mouth every morning. 08/18/22  Yes [provider]  clindamycin (CLEOCIN) 300 MG capsule Take 1 capsule (300 mg total) by mouth 3 (three) times daily for 6 days. 02/22/23 02/28/23 Yes [name], DO  furosemide (LASIX) 40 MG tablet Take to 1 tablet (40 mg total) by mouth ONCE OR TWICE daily (total daily dose maximum 80 mg) as needed for up to 3 days for increased leg swelling, shortness of breath, weight gain 5+ lbs over 1-2 days. Seek medical care if these symptoms are not improving with increased dose. 02/22/23  Yes Alexander, Dorene Grebe, DO   SUBOXONE 8-2 MG FILM Place under the tongue 2 (two) times daily. 02/11/23  Yes [provider]  Vibegron (GEMTESA) 75 MG TABS Take 75 mg by mouth daily. 11/17/22  Yes Harle Battiest, PA-C    Physical Exam: Vitals:   02/26/23 1149 02/26/23 1200 02/26/23 1400 02/26/23 1556  BP:  (!) 134/59 (!) 153/69   Pulse:  (!) 54 (!) 55   Resp:  13 12   Temp:    98 F (36.7 C)  TempSrc:    Oral  SpO2: 98% 100% 100%    General: Not in acute distress.  Thin body habitus HEENT:       Eyes: PERRL, EOMI, no scleral icterus.       ENT: No discharge from the ears and nose, no pharynx injection, no tonsillar enlargement.        Neck: No JVD, no bruit, no mass felt. Heme: No neck lymph node enlargement. Cardiac: S1/S2, RRR, No murmurs, No gallops or rubs. Respiratory:  No rales, wheezing, rhonchi or rubs. GI: Soft, nondistended, nontender, no rebound pain, no organomegaly, BS present. GU: No hematuria Ext: 3+ pitting leg edema bilaterally, with mild erythema, mild warmth, tenderness. 1+DP/PT pulse bilaterally.    Musculoskeletal: No joint deformities, No joint redness or warmth, no limitation of ROM in spin. Skin: No rashes.  Neuro: Alert, oriented X3, cranial nerves II-XII grossly intact, moves all extremities normally.  Psych: Patient is not psychotic, no suicidal or hemocidal ideation.  Labs on Admission: I have personally reviewed following labs and imaging studies  CBC: Recent Labs  Lab 02/20/23 0858 02/21/23 0500 02/26/23 1055  WBC 6.5 4.2 6.2  NEUTROABS 4.5  --  4.1  HGB 11.4* 10.4* 11.1*  HCT 32.4* 29.1* 32.0*  MCV 81.4 80.8 81.6  PLT 129* 115* 190   Basic Metabolic Panel: Recent Labs  Lab 02/20/23 0858 02/21/23 0500 02/22/23 0549 02/26/23 1055  NA 137 139 138 139  K 3.9 3.4* 4.0 4.1  CL 104 106 105 101  CO2 26 27 29 30   GLUCOSE 96 102* 110* 97  BUN 27* 34* 29* 25*  CREATININE 1.25* 1.42* 1.33* 1.28*  CALCIUM 9.1 8.7* 8.6* 9.3  MG 2.0  --   --   --   PHOS  3.7  --   --   --    GFR: Estimated Creatinine Clearance: 40.3 mL/min (A) (by C-G formula based on SCr of 1.28 mg/dL (H)). Liver Function Tests: Recent Labs  Lab 02/20/23 0858 02/26/23 1055  AST 36 27  ALT 19 20  ALKPHOS 69 78  BILITOT 0.7 0.7  PROT 7.5 7.6  ALBUMIN 3.3* 3.4*   No results for input(s): "LIPASE", "AMYLASE" in the last 168 hours. No results for input(s): "AMMONIA" in the last 168 hours. Coagulation Profile: No results for input(s): "INR", "PROTIME" in the last 168 hours. Cardiac Enzymes: No results for input(s): "CKTOTAL", "CKMB", "CKMBINDEX", "TROPONINI" in the last 168 hours. BNP (last 3 results) No results for input(s): "PROBNP" in the last 8760 hours. HbA1C: No results for input(s): "HGBA1C" in the last 72 hours. CBG: No results for input(s): "GLUCAP" in the last 168 hours. Lipid Profile: No results for input(s): "CHOL", "HDL", "LDLCALC", "TRIG", "CHOLHDL", "LDLDIRECT" in the last 72 hours. Thyroid Function Tests: No results for input(s): "TSH", "T4TOTAL", "FREET4", "T3FREE", "THYROIDAB" in the last 72 hours. Anemia Panel: No results for input(s): "VITAMINB12", "FOLATE", "FERRITIN", "TIBC", "IRON", "RETICCTPCT" in the last 72 hours. Urine analysis:    Component Value Date/Time   COLORURINE YELLOW (A) 05/22/2022 1319   APPEARANCEUR Clear 09/15/2022 1006   LABSPEC 1.026 05/22/2022 1319   PHURINE 5.0 05/22/2022 1319   GLUCOSEU Negative 09/15/2022 1006   HGBUR NEGATIVE 05/22/2022 1319   BILIRUBINUR Negative 09/15/2022 1006   KETONESUR NEGATIVE 05/22/2022 1319   PROTEINUR 1+ (A) 09/15/2022 1006   PROTEINUR 30 (A) 05/22/2022 1319   UROBILINOGEN 1.0 08/24/2008 1433   NITRITE Negative 09/15/2022 1006   NITRITE NEGATIVE 05/22/2022 1319   LEUKOCYTESUR Negative 09/15/2022 1006   LEUKOCYTESUR NEGATIVE 05/22/2022 1319   Sepsis Labs: @LABRCNTIP (procalcitonin:4,lacticidven:4) )No results found for this or any previous visit (from the past 240 hour(s)).    Radiological Exams on Admission: US Venous Img Lower Bilateral  Result Date: 02/26/2023 CLINICAL DATA:  Bilateral lower extremity edema EXAM: BILATERAL LOWER EXTREMITY VENOUS DOPPLER ULTRASOUND TECHNIQUE: Gray-scale sonography with graded compression, as well as color Doppler and duplex ultrasound were performed to evaluate the lower extremity deep venous systems from the level of the common  femoral vein and including the common femoral, femoral, profunda femoral, popliteal and calf veins including the posterior tibial, peroneal and gastrocnemius veins when visible. The superficial great saphenous vein was also interrogated. Spectral Doppler was utilized to evaluate flow at rest and with distal augmentation maneuvers in the common femoral, femoral and popliteal veins. COMPARISON:  None Available. FINDINGS: RIGHT LOWER EXTREMITY Common Femoral Vein: No evidence of thrombus. Normal compressibility, respiratory phasicity and response to augmentation. Saphenofemoral Junction: No evidence of thrombus. Normal compressibility and flow on color Doppler imaging. Profunda Femoral Vein: No evidence of thrombus. Normal compressibility and flow on color Doppler imaging. Femoral Vein: No evidence of thrombus. Normal compressibility, respiratory phasicity and response to augmentation. Popliteal Vein: No evidence of thrombus. Normal compressibility, respiratory phasicity and response to augmentation. Calf Veins: No evidence of thrombus. Normal compressibility and flow on color Doppler imaging. Superficial Great Saphenous Vein: No evidence of thrombus. Normal compressibility. Venous Reflux:  None. Other Findings:  None. LEFT LOWER EXTREMITY Common Femoral Vein: No evidence of thrombus. Normal compressibility, respiratory phasicity and response to augmentation. Saphenofemoral Junction: No evidence of thrombus. Normal compressibility and flow on color Doppler imaging. Profunda Femoral Vein: No evidence of thrombus. Normal  compressibility and flow on color Doppler imaging. Femoral Vein: No evidence of thrombus. Normal compressibility, respiratory phasicity and response to augmentation. Popliteal Vein: No evidence of thrombus. Normal compressibility, respiratory phasicity and response to augmentation. Calf Veins: No evidence of thrombus. Normal compressibility and flow on color Doppler imaging. Superficial Great Saphenous Vein: No evidence of thrombus. Normal compressibility. Venous Reflux:  None. Other Findings:  None. IMPRESSION: No evidence of deep venous thrombosis in either lower extremity. Electronically Signed   By: Malachy Moan M.D.   On: 02/26/2023 16:00   DG Chest 2 View  Result Date: 02/26/2023 CLINICAL DATA:  sob EXAM: CHEST - 2 VIEW COMPARISON:  Chest x-ray 06/15/2022. FINDINGS: The heart size and mediastinal contours are within normal limits. Both lungs are clear. No visible pleural effusions or pneumothorax. No acute osseous abnormality. Remote left rib fractures. IMPRESSION: No active cardiopulmonary disease. Electronically Signed   By: Feliberto Harts M.D.   On: 02/26/2023 11:41      Assessment/Plan Principal Problem:   Bilateral leg edema Active Problems:   Chronic diastolic CHF (congestive heart failure)   Essential hypertension   Polysubstance abuse   Tobacco abuse   Fall at home, initial encounter   Depression with anxiety   Protein-calorie malnutrition, moderate   Assessment and Plan:  Bilateral leg edema: Etiology is not clear.  Patient recently had negative lower extremity venous Doppler for DVT on 4/7.  2D echo on 4/8 showed EF of 60 to 75% with grade 1 diastolic dysfunction, but today patient's BNP is normal at 53.2, no shortness of breath, no pulm edema on x-ray, does not seem to have CHF exacerbation.  Patient's liver function is normal and albumin is 3.4 which should not cause such severe leg edema. Pt is taking clindamycin since discharge from previous admission, but patient  does not have fever or leukocytosis.  Not sure if patient has leg cellulitis.  -will place in tele bed for obs -Continue home clindamycin -Check procalcitonin level, ESR and CRP -Repeat lower extremity venous Doppler to rule out DVT --> negative for DVT. -MRV of abdomen and pelvis is ordered by EDP to rule out thrombus given history of prostate cancer -As needed Tylenol, Percocet for pain -continue Lasix 80 mg daily  Chronic diastolic CHF (congestive heart failure): 2D  echo on 4/80/24 showed EF of 60 to 75% with grade 1 diastolic dysfunction.  BNP 53.2.  CHF seem to be compensated. -Continue Lasix 80 mg daily  Essential hypertension -IV hydralazine as needed -Patient is taking Lasix -Amlodipine  Polysubstance abuse and Tobacco abuse -Did counseling about importance of quitting substance use -Continue home Suboxone -Nicotine patch  Fall at home, initial encounter -Follow-up CT head --> negative -PT  Depression with anxiety: Patient not taking medications currently.  No suicidal homicidal ideations. -Observe closely  Protein-calorie malnutrition, moderate: Body weight 51.6 kg, BMI 18.94 -Ensure -Nutrition consult    DVT ppx:  SQ Lovenox  Code Status: Full code  Family Communication: I offered to call his family, but patient stated I do not need to call his family since he already talked to his uncle by phone  Disposition Plan:  Anticipate discharge back to previous environment  Consults called:  none  Admission status and Level of care: Telemetry Medical:  for obs    Dispo: The patient is from: Home              Anticipated d/c is to: Home              Anticipated d/c date is: 1 day              Patient currently is not medically stable to d/c.    Severity of Illness:  The appropriate patient status for this patient is OBSERVATION. Observation status is judged to be reasonable and necessary in order to provide the required intensity of service to ensure the  patient's safety. The patient's presenting symptoms, physical exam findings, and initial radiographic and laboratory data in the context of their medical condition is felt to place them at decreased risk for further clinical deterioration. Furthermore, it is anticipated that the patient will be medically stable for discharge from the hospital within 2 midnights of admission.        Date of Service 02/26/2023    Lorretta Harp [facility]   If 7PM-7AM, please contact night-coverage [email or web address] 02/26/2023, 4:05 PM

## 2023-02-26 NOTE — ED Triage Notes (Signed)
Pt presents via POV c/o lower extremity swelling and pain. Reports seen here last week for same and told to come back if returned. Ambulatory to triage.

## 2023-02-26 NOTE — ED Notes (Signed)
This RN, Maisie Fus, [facility], and security to bedside. Pt consented to search of personal belongings due to smoking in the bathroom x 2 today. See belongings documented by this RN.   Per security, cigarrette butt and mini bottle destroyed with patient consent.

## 2023-02-26 NOTE — ED Provider Notes (Signed)
[facility] Provider Note    Event Date/Time   First MD Initiated Contact with Patient 02/26/23 1054     (approximate)   History   Leg Swelling   HPI  Alexander Avery is a 67 y.o. male with polysubstance abuse currently on Suboxone, prostate cancer, hypertension, diabetes, seizure disorder who comes in with continued leg swelling.  Patient reports significant swelling in his legs.  He states that he was admitted to the hospital and has been on antibiotics and Lasix but continuing to have worsening leg swelling, pain.  He denies ever having this previously prior to his recent admission.  He is does states that he is followed by podiatry for some concern for his leg but denies having any recent surgeries.  He denies any obvious shortness of breath.     Physical Exam   Triage Vital Signs: ED Triage Vitals  Enc Vitals Group     BP 02/26/23 1047 (!) 157/81     Pulse Rate 02/26/23 1047 77     Resp 02/26/23 1047 16     Temp 02/26/23 1047 97.8 F (36.6 C)     Temp Source 02/26/23 1047 Oral     SpO2 02/26/23 1047 100 %     Weight --      Height --      Head Circumference --      Peak Flow --      Pain Score 02/26/23 1048 9     Pain Loc --      Pain Edu? --      Excl. in GC? --     Most recent vital signs: Vitals:   02/26/23 1047  BP: (!) 157/81  Pulse: 77  Resp: 16  Temp: 97.8 F (36.6 C)  SpO2: 100%     General: Awake, no distress.  CV:  Good peripheral perfusion.  Resp:  Normal effort.  Abd:  No distention.  Other:  Patient has 2+ edema to the level of the knee they feel warm bilaterally.  Good distal pulses bilaterally.  No open wounds noted.   ED Results / Procedures / Treatments   Labs (all labs ordered are listed, but only abnormal results are displayed) Labs Reviewed  CBC WITH DIFFERENTIAL/PLATELET - Abnormal; Notable for the following components:      Result Value   RBC 3.92 (*)    Hemoglobin 11.1 (*)    HCT 32.0 (*)     All other components within normal limits  BASIC METABOLIC PANEL  BRAIN NATRIURETIC PEPTIDE  HEPATIC FUNCTION PANEL       RADIOLOGY I have reviewed the xray personally and interpreted no pulmonary edema  PROCEDURES:  Critical Care performed: No  Procedures   MEDICATIONS ORDERED IN ED: Medications  buprenorphine-naloxone (SUBOXONE) 8-2 mg per SL tablet 1 tablet (1 tablet Sublingual Given 02/26/23 1330)  oxyCODONE-acetaminophen (PERCOCET/ROXICET) 5-325 MG per tablet 1 tablet (has no administration in time range)  acetaminophen (TYLENOL) tablet 650 mg (has no administration in time range)  ondansetron (ZOFRAN) injection 4 mg (has no administration in time range)  hydrALAZINE (APRESOLINE) injection 5 mg (has no administration in time range)  feeding supplement (ENSURE ENLIVE / ENSURE PLUS) liquid 237 mL (has no administration in time range)  nicotine (NICODERM CQ - dosed in mg/24 hours) patch 21 mg (has no administration in time range)  enoxaparin (LOVENOX) injection 40 mg (has no administration in time range)  iohexol (OMNIPAQUE) 350 MG/ML injection 125 mL (100 mLs Intravenous  Contrast Given 02/26/23 1410)     IMPRESSION / MDM / ASSESSMENT AND PLAN / ED COURSE  I reviewed the triage vital signs and the nursing notes.   Patient's presentation is most consistent with acute presentation with potential threat to life or bodily function.   Patient comes in with worsening leg swelling in the setting of being compliant with Lasix, antibiotics.  Labs ordered evaluate for worsening infection, heart failure, electrolyte abnormalities.  He is already undergone ultrasounds less than a week ago without any evidence of DVT  CBC shows stable hemoglobin.  White count is similar to prior.  BMP shows creatinine around baseline.  BNP remains negative.  12:09 PM patient does report history of prostate cancer I will get a CTV to make sure there is no evidence of high or a clot that could be causing  the bilateral leg swelling given his workup so far has been unrevealing and not getting better with Lasix and antibiotics.  His right leg does feel slightly more warm and slightly more red than the left so I wonder if this could be an underlying cellulitis.  Patient is already on clindamycin so failing outpatient treatment.  Patient is adamant that he will not do CTV.  He is adamant against the contrast for CT scan.  He reports severe reactions including difficulty breathing, "panic attack".  He states that he would be willing to try an MRI with their contrast.  I have changed the order.  Given the concern for patient's worsening symptoms in the setting of Lasix, antibiotics and will discuss with the hospital team for admission with this pending.  Patient expressed understanding and felt comfortable with this plan.  We can also try to repeat the ultrasound to see if anything has progressed in regards to a clot.    The patient is on the cardiac monitor to evaluate for evidence of arrhythmia and/or significant heart rate changes.      FINAL CLINICAL IMPRESSION(S) / ED DIAGNOSES   Final diagnoses:  Leg swelling     Rx / DC Orders   ED Discharge Orders     None        Note:  This document was prepared using Dragon voice recognition software and may include unintentional dictation errors.   Concha Se, MD 02/26/23 [phone]

## 2023-02-26 NOTE — ED Notes (Signed)
Pt to ED for ongoing bilateral lower leg swelling and pain. Both lower legs are tight and shiny with marked pitting edema to knee. Seen here recently for same and started on diuretics, has been urinating a lot but states swelling is the same. Ambulatory with walker.

## 2023-02-26 NOTE — ED Notes (Addendum)
RN at bedside to answer call bell - pt standing at side of bed and ambulating in room without difficulty. Pt reports he is waiting for his food to be warmed. This RN explaining there is no pt microwave located in the ER and it is against guidelines to reheat pt meals in staff break room. Pt reports, "I want to talk to your supervisor." This RN stating "okay" and exited room.

## 2023-02-26 NOTE — ED Notes (Signed)
Pt assisted to restroom down the hallway from pt room by this tech and Kiyah, NT via walker. After pt was finished, the bathroom smelled like cigarettes. Charge RN aware and spoke to pt. RN taking care of pt also aware.

## 2023-02-27 ENCOUNTER — Observation Stay: Payer: 59

## 2023-02-27 DIAGNOSIS — R600 Localized edema: Secondary | ICD-10-CM | POA: Diagnosis not present

## 2023-02-27 DIAGNOSIS — R2243 Localized swelling, mass and lump, lower limb, bilateral: Secondary | ICD-10-CM | POA: Diagnosis not present

## 2023-02-27 LAB — CBC
HCT: 28.6 % — ABNORMAL LOW (ref 39.0–52.0)
Hemoglobin: 10 g/dL — ABNORMAL LOW (ref 13.0–17.0)
MCH: 28.4 pg (ref 26.0–34.0)
MCHC: 35 g/dL (ref 30.0–36.0)
MCV: 81.3 fL (ref 80.0–100.0)
Platelets: 177 10*3/uL (ref 150–400)
RBC: 3.52 MIL/uL — ABNORMAL LOW (ref 4.22–5.81)
RDW: 14 % (ref 11.5–15.5)
WBC: 4.9 10*3/uL (ref 4.0–10.5)
nRBC: 0 % (ref 0.0–0.2)

## 2023-02-27 LAB — C-REACTIVE PROTEIN: CRP: 0.9 mg/dL (ref <1.0)

## 2023-02-27 LAB — HIV ANTIBODY (ROUTINE TESTING W REFLEX): HIV Screen 4th Generation wRfx: NONREACTIVE

## 2023-02-27 LAB — BASIC METABOLIC PANEL
Anion gap: 7 (ref 5–15)
BUN: 25 mg/dL — ABNORMAL HIGH (ref 8–23)
CO2: 29 mmol/L (ref 22–32)
Calcium: 8.7 mg/dL — ABNORMAL LOW (ref 8.9–10.3)
Chloride: 100 mmol/L (ref 98–111)
Creatinine, Ser: 1.24 mg/dL (ref 0.61–1.24)
GFR, Estimated: >60 mL/min (ref >60)
Glucose, Bld: 117 mg/dL — ABNORMAL HIGH (ref 70–99)
Potassium: 3.6 mmol/L (ref 3.5–5.1)
Sodium: 136 mmol/L (ref 135–145)

## 2023-02-27 NOTE — ED Notes (Signed)
Pt to nurse's desk to ask to use phone.

## 2023-02-27 NOTE — Discharge Summary (Signed)
Physician Discharge Summary   Patient: Alexander Avery MRN: [id number] DOB: [birth year]  Admit date:     02/26/2023  Discharge date: 02/27/23  Discharge Physician: Loyce Dys   PCP: [name], MD   Recommendations at discharge:  Given findings of lung nodule patient instructed to follow-up with his primary care physician to have repeat CT scan of the lungs in 3 months.  Discharge Diagnoses: Bilateral leg edema: Of unclear etiology likely secondary to chronic venous stasis Chronic diastolic CHF (congestive heart failure):  Essential hypertension Polysubstance abuse and Tobacco abuse Fall at home, initial encounter Depression with anxiety:  Protein-calorie malnutrition, moderate: Body weight 51.6 kg, BMI 18.94   Hospital Course: Alexander Avery is a 67 y.o. male with medical history significant of hypertension, diet-controlled diabetes (patient denies history of diabetes), gastric ulcer, depression with anxiety, seizure (patient denies history of seizure), prostate cancer (s/p of surgery and radiation therapy), polysubstance abuse on Suboxone, diagnosed with diastolic CHF recently, tobacco abuse, who presents with bilateral leg edema.  Patient was recently hospitalized from 4/7 - 4/9 due to near syncope and bilateral leg edema.  Lower extremity venous Doppler on 4/7 was negative for DVT.  Patient had 2D echo which showed EF of 60 to 75% with grade 1 diastolic dysfunction.  Patient was started on clindamycin and Lasix.  Patient states that he is taking Lasix consistently, but bilateral leg edema has not improved.  Patient did not have any findings suggestive of infection or cellulitis. He underwent repeat lower extremity venous Doppler which was negative for DVT. He also had MRV of abdomen and pelvis i which ruled out thrombus given history of prostate cancer.  He had incidental finding of lung nodule measuring 11 mm x 8 mm and have been instructed to follow-up with his primary  care physician and to have repeat CT scan of the chest in 3 months.  Lower extremity swelling likely secondary to venous stasis, patient has been instructed to have lower limb elevation as well as to wear TED stockings.  Consultants: None Procedures performed: None Disposition: Home Diet recommendation: Cardiac diet  Discharge Diet Orders (From admission, onward)     Start     Ordered   02/27/23 0000  Diet - low sodium heart healthy        02/27/23 1136           Cardiac diet DISCHARGE MEDICATION: Allergies as of 02/27/2023       Reactions   Penicillins Hives, Other (See Comments)   Urinate on self, pass out Has patient had a PCN reaction causing immediate rash, facial/tongue/throat swelling, SOB or lightheadedness with hypotension: Yes Has patient had a PCN reaction causing severe rash involving mucus membranes or skin necrosis: No Has patient had a PCN reaction that required hospitalization: Yes- In hospital Has patient had a PCN reaction occurring within the last 10 years: Yes If all of the above answers are "NO", then may proceed with Cephalosporin use. Other reaction(s): Other (See Comments) Urinate on self, pass out Has patient had a PCN reaction causing immediate rash, facial/tongue/throat swelling, SOB or lightheadedness with hypotension: Yes Has patient had a PCN reaction causing severe rash involving mucus membranes or skin necrosis: No Has patient had a PCN reaction that required hospitalization: Yes- In hospital Has patient had a PCN reaction occurring within the last 10 years: Yes If all of the above answers are "NO", then may proceed with Cephalosporin use. "I pass out"  Medication List     TAKE these medications    amLODipine 5 MG tablet Commonly known as: NORVASC Take 5 mg by mouth every morning.   clindamycin 300 MG capsule Commonly known as: CLEOCIN Take 1 capsule (300 mg total) by mouth 3 (three) times daily for 6 days.   furosemide 40 MG  tablet Commonly known as: Lasix Take to 1 tablet (40 mg total) by mouth ONCE OR TWICE daily (total daily dose maximum 80 mg) as needed for up to 3 days for increased leg swelling, shortness of breath, weight gain 5+ lbs over 1-2 days. Seek medical care if these symptoms are not improving with increased dose.   Gemtesa 75 MG Tabs Generic drug: Vibegron Take 75 mg by mouth daily.   Suboxone 8-2 MG Film Generic drug: Buprenorphine HCl-Naloxone HCl Place under the tongue 2 (two) times daily.        Discharge Exam: Vitals reviewed and documented separately General: Not in acute distress.  Thin body habitus  Eyes: PERRL, EOMI, no scleral icterus. Heme: No neck lymph node enlargement. Cardiac: S1/S2, RRR, No murmurs, No gallops or rubs. Respiratory: No rales, wheezing, rhonchi or rubs. GI: Soft, nondistended, nontender, no rebound pain, no organomegaly, BS present. GU: No hematuria Ext: Bilateral lower extremity pitting edema  Condition at discharge: good Discharge time spent: greater than 30 minutes.  Signed: Loyce Dys, MD [facility] 02/27/2023

## 2023-02-28 ENCOUNTER — Telehealth: Payer: Self-pay | Admitting: Urology

## 2023-02-28 NOTE — Telephone Encounter (Signed)
DOS - 03/21/23  TARSAL EXOSTECTOMY RIGHT --- [address]  [facility] EFFECTIVE DATE - 11/15/22  DEDUCTIBLE - $240.00 W/ $0.00 REMAINING OOP - $8,850.00 W/ $8,657.84 REMAINING COINSURANCE - 20%   PER [employer] WEBSITE FOR CPT CODE 69629 Notification or Prior Authorization is not required for the requested services You are not required to submit a notification/prior authorization based on the information provided. If you have general questions about the prior authorization requirements, visit [email or web address] > Clinician Resources > Advance and Admission Notification Requirements. The number above acknowledges your notification. Please write this reference number down for future reference. If you would like to request an organization determination, please call us at [phone].   Decision ID #: [id number]

## 2023-03-11 ENCOUNTER — Other Ambulatory Visit: Payer: 59

## 2023-03-21 ENCOUNTER — Other Ambulatory Visit: Payer: Self-pay | Admitting: Podiatry

## 2023-03-21 ENCOUNTER — Emergency Department
Admission: EM | Admit: 2023-03-21 | Discharge: 2023-03-21 | Discharge status: Against Medical Advice | Payer: 59 | Attending: Emergency Medicine | Admitting: Emergency Medicine

## 2023-03-21 ENCOUNTER — Telehealth: Payer: Self-pay

## 2023-03-21 DIAGNOSIS — I10 Essential (primary) hypertension: Secondary | ICD-10-CM | POA: Diagnosis not present

## 2023-03-21 DIAGNOSIS — R55 Syncope and collapse: Secondary | ICD-10-CM | POA: Diagnosis present

## 2023-03-21 DIAGNOSIS — Z8546 Personal history of malignant neoplasm of prostate: Secondary | ICD-10-CM | POA: Insufficient documentation

## 2023-03-21 DIAGNOSIS — Z5321 Procedure and treatment not carried out due to patient leaving prior to being seen by health care provider: Secondary | ICD-10-CM | POA: Insufficient documentation

## 2023-03-21 LAB — CBC WITH DIFFERENTIAL/PLATELET
Abs Immature Granulocytes: 0.01 10*3/uL (ref 0.00–0.07)
Basophils Absolute: 0 10*3/uL (ref 0.0–0.1)
Basophils Relative: 1 %
Eosinophils Absolute: 0.6 10*3/uL — ABNORMAL HIGH (ref 0.0–0.5)
Eosinophils Relative: 12 %
HCT: 29.9 % — ABNORMAL LOW (ref 39.0–52.0)
Hemoglobin: 10.4 g/dL — ABNORMAL LOW (ref 13.0–17.0)
Immature Granulocytes: 0 %
Lymphocytes Relative: 27 %
Lymphs Abs: 1.3 10*3/uL (ref 0.7–4.0)
MCH: 28.6 pg (ref 26.0–34.0)
MCHC: 34.8 g/dL (ref 30.0–36.0)
MCV: 82.1 fL (ref 80.0–100.0)
Monocytes Absolute: 0.5 10*3/uL (ref 0.1–1.0)
Monocytes Relative: 11 %
Neutro Abs: 2.3 10*3/uL (ref 1.7–7.7)
Neutrophils Relative %: 49 %
Platelets: 160 10*3/uL (ref 150–400)
RBC: 3.64 MIL/uL — ABNORMAL LOW (ref 4.22–5.81)
RDW: 14.6 % (ref 11.5–15.5)
WBC: 4.7 10*3/uL (ref 4.0–10.5)
nRBC: 0 % (ref 0.0–0.2)

## 2023-03-21 LAB — COMPREHENSIVE METABOLIC PANEL
ALT: 20 U/L (ref 0–44)
AST: 23 U/L (ref 15–41)
Albumin: 3.3 g/dL — ABNORMAL LOW (ref 3.5–5.0)
Alkaline Phosphatase: 81 U/L (ref 38–126)
Anion gap: 8 (ref 5–15)
BUN: 19 mg/dL (ref 8–23)
CO2: 27 mmol/L (ref 22–32)
Calcium: 8.8 mg/dL — ABNORMAL LOW (ref 8.9–10.3)
Chloride: 102 mmol/L (ref 98–111)
Creatinine, Ser: 1.27 mg/dL — ABNORMAL HIGH (ref 0.61–1.24)
GFR, Estimated: >60 mL/min (ref >60)
Glucose, Bld: 94 mg/dL (ref 70–99)
Potassium: 3.9 mmol/L (ref 3.5–5.1)
Sodium: 137 mmol/L (ref 135–145)
Total Bilirubin: 0.7 mg/dL (ref 0.3–1.2)
Total Protein: 7.3 g/dL (ref 6.5–8.1)

## 2023-03-21 LAB — TROPONIN I (HIGH SENSITIVITY): Troponin I (High Sensitivity): 12 ng/L (ref <18)

## 2023-03-21 MED ORDER — IBUPROFEN 800 MG PO TABS: 800.0000 mg | ORAL_TABLET | Freq: Four times a day (QID) | ORAL | 1 refills | Status: DC | PRN

## 2023-03-21 NOTE — ED Triage Notes (Signed)
Pt c/o syncopal episode at home tonight causing fall. Pt denies any injury from fall. Denies chest pain, denies sob. Seen here recently for same.

## 2023-03-21 NOTE — Telephone Encounter (Signed)
Received email from [facility]. Yi was a no show for surgery today with Dr. Allena Katz.

## 2023-03-21 NOTE — ED Triage Notes (Signed)
FIRST NURSE NOTE:  Pt arrived via [facility] from home s/p syncopal episode, here recently for the same.   64 100% RA 154/74 CBG 98  Hx prostate CA, HTN

## 2023-03-21 NOTE — ED Notes (Addendum)
Called for repeat labs, no answer, not visualized in lobby

## 2023-03-29 ENCOUNTER — Encounter: Payer: 59 | Admitting: Podiatry

## 2023-04-12 ENCOUNTER — Encounter: Payer: 59 | Admitting: Podiatry

## 2023-04-18 ENCOUNTER — Ambulatory Visit: Payer: 59 | Admitting: Internal Medicine

## 2023-04-22 ENCOUNTER — Other Ambulatory Visit: Payer: Self-pay | Admitting: Internal Medicine

## 2023-04-22 ENCOUNTER — Other Ambulatory Visit: Payer: 59

## 2023-04-23 LAB — COMPREHENSIVE METABOLIC PANEL
ALT: 12 IU/L (ref 0–44)
AST: 22 IU/L (ref 0–40)
Albumin/Globulin Ratio: 1.1 — ABNORMAL LOW (ref 1.2–2.2)
Albumin: 3.8 g/dL — ABNORMAL LOW (ref 3.9–4.9)
Alkaline Phosphatase: 96 IU/L (ref 44–121)
BUN/Creatinine Ratio: 16 (ref 10–24)
BUN: 20 mg/dL (ref 8–27)
Bilirubin Total: 0.5 mg/dL (ref 0.0–1.2)
CO2: 25 mmol/L (ref 20–29)
Calcium: 9.9 mg/dL (ref 8.6–10.2)
Chloride: 101 mmol/L (ref 96–106)
Creatinine, Ser: 1.29 mg/dL — ABNORMAL HIGH (ref 0.76–1.27)
Globulin, Total: 3.5 g/dL (ref 1.5–4.5)
Glucose: 87 mg/dL (ref 70–99)
Potassium: 4.2 mmol/L (ref 3.5–5.2)
Sodium: 140 mmol/L (ref 134–144)
Total Protein: 7.3 g/dL (ref 6.0–8.5)
eGFR: 60 mL/min/{1.73_m2} (ref >59)

## 2023-04-23 LAB — TSH: TSH: 0.581 u[IU]/mL (ref 0.450–4.500)

## 2023-04-27 NOTE — Progress Notes (Deleted)
01/31/2019 10:41 AM   Alexander Avery Dec 14, 1955 [id number]  Referring provider: [name], MD [address] [place],  [place] [address]  Urological history 1. Prostate cancer - PSA (09/2022) 0.8 - diagnosed at [facility] - iPSA 15.0 - DRE nodularity and induration in the left lobe.  Biopsy was performed on 02/05/2015.  His prostate was 20.5 grams PSA density of 0.73.   Gleason score 6 and 7 (3+4) involving all 6 biopsies zones - RRP on 04/21/2015 -pathology showed a Gleason score 7 involving 25% of the gland with negative surgical margins - ADT 04/2019 - Completed salvage radiation therapy 07/2019   2. Urethral stricture - underwent urethral dilation for a stricture at the vesicourethral anastomosis on November 2016 with Sissy Hoff sounds - cytoscopy with Dr. Lonna Cobb on 09/15/2018, no evidence of urethral stricture or bladder neck contracture  3. LU TS -Contributing factors of age, prostate cancer, pelvic radiation, pelvic surgery, cocaine use, hypertension, sleep apnea, diabetes, arthritis, depression, anxiety and diuretics  4. ED - contributing factors of age, pelvic radiation, prostate cancer, DM, HTN, HLD, hypothyroidism, sleep apnea, stress, anxiety, alcohol abuse and smoking - sildenafil 100 mg on-demand-dosing  No chief complaint on file.    HPI: Alexander Avery is a 67 y.o. male who presents today for urinary leakage.   I last saw him on November 17, 2022 for routine follow-up.  It was noted at that time that he was having evidence of biochemical recurrence with increase in his PSA to 0.8.  I ordered a PSMA PET/CT, but patient would not return our phone calls and therefore we were unable to schedule.    He was recently admitted for lower extremity edema.  MRI of the abdomen and pelvis did not identify any lesions suspicious for prostate cancer metastases.  He also underwent CT of the head and chest which did not identify any suspicious lesions for prostate  cancer metastasis.    UA ***  PVR *** mL    PMH: Past Medical History:  Diagnosis Date   Anxiety    Arthritis    Cancer (HCC)    prostate   Cataract 2019   Diabetes mellitus without complication (HCC)    Diastolic CHF, chronic (HCC)    GERD (gastroesophageal reflux disease)    Hernia of abdominal wall 2015   History of stomach ulcers    Hypertension    Prostate cancer (HCC)    Seizures (HCC)    Sleep apnea     Surgical History: Past Surgical History:  Procedure Laterality Date   CATARACT EXTRACTION W/PHACO Right 03/30/2018   Procedure: CATARACT EXTRACTION PHACO AND INTRAOCULAR LENS PLACEMENT (IOC);  Surgeon: Nevada Crane, MD;  Location: [facility] ORS;  Service: Ophthalmology;  Laterality: Right;  [place] 01:09.5 AP% 10.4 CDE 7.98 Fluid pack lot # [id number]   CATARACT EXTRACTION W/PHACO Left 06/29/2018   Procedure: CATARACT EXTRACTION PHACO AND INTRAOCULAR LENS PLACEMENT (IOC);  Surgeon: Nevada Crane, MD;  Location: [facility] ORS;  Service: Ophthalmology;  Laterality: Left;  [place] 01:27.7 AP% 8.2 CDE 7.27 Fluid pack lot # [id number]   EYE SURGERY     HERNIA REPAIR     PROSTATE SURGERY     PROSTATE SURGERY  2016    Home Medications:  Allergies as of 04/28/2023       Reactions   Penicillins Hives, Other (See Comments)   Urinate on self, pass out Has patient had a PCN reaction causing immediate rash, facial/tongue/throat swelling, SOB or lightheadedness  with hypotension: Yes Has patient had a PCN reaction causing severe rash involving mucus membranes or skin necrosis: No Has patient had a PCN reaction that required hospitalization: Yes- In hospital Has patient had a PCN reaction occurring within the last 10 years: Yes If all of the above answers are "NO", then may proceed with Cephalosporin use. Other reaction(s): Other (See Comments) Urinate on self, pass out Has patient had a PCN reaction causing immediate rash, facial/tongue/throat swelling, SOB or lightheadedness with  hypotension: Yes Has patient had a PCN reaction causing severe rash involving mucus membranes or skin necrosis: No Has patient had a PCN reaction that required hospitalization: Yes- In hospital Has patient had a PCN reaction occurring within the last 10 years: Yes If all of the above answers are "NO", then may proceed with Cephalosporin use. "I pass out"        Medication List        Accurate as of April 27, 2023 10:41 AM. If you have any questions, ask your nurse or doctor.          amLODipine 5 MG tablet Commonly known as: NORVASC Take 5 mg by mouth every morning.   furosemide 40 MG tablet Commonly known as: Lasix Take to 1 tablet (40 mg total) by mouth ONCE OR TWICE daily (total daily dose maximum 80 mg) as needed for up to 3 days for increased leg swelling, shortness of breath, weight gain 5+ lbs over 1-2 days. Seek medical care if these symptoms are not improving with increased dose.   Gemtesa 75 MG Tabs Generic drug: Vibegron Take 75 mg by mouth daily.   ibuprofen 800 MG tablet Commonly known as: ADVIL Take 1 tablet (800 mg total) by mouth every 6 (six) hours as needed.   Suboxone 8-2 MG Film Generic drug: Buprenorphine HCl-Naloxone HCl Place under the tongue 2 (two) times daily.        Allergies:  Allergies  Allergen Reactions   Penicillins Hives and Other (See Comments)    Urinate on self, pass out Has patient had a PCN reaction causing immediate rash, facial/tongue/throat swelling, SOB or lightheadedness with hypotension: Yes Has patient had a PCN reaction causing severe rash involving mucus membranes or skin necrosis: No Has patient had a PCN reaction that required hospitalization: Yes- In hospital Has patient had a PCN reaction occurring within the last 10 years: Yes If all of the above answers are "NO", then may proceed with Cephalosporin use.  Other reaction(s): Other (See Comments) Urinate on self, pass out Has patient had a PCN reaction causing  immediate rash, facial/tongue/throat swelling, SOB or lightheadedness with hypotension: Yes Has patient had a PCN reaction causing severe rash involving mucus membranes or skin necrosis: No Has patient had a PCN reaction that required hospitalization: Yes- In hospital Has patient had a PCN reaction occurring within the last 10 years: Yes If all of the above answers are "NO", then may proceed with Cephalosporin use. "I pass out"     Family History: Family History  Problem Relation Age of Onset   Cancer Mother     Social History:  reports that he has been smoking cigarettes. He has been smoking an average of 2 packs per day. He has been exposed to tobacco smoke. He has never used smokeless tobacco. He reports current alcohol use of about 2.0 standard drinks of alcohol per week. He reports that he does not use drugs.  ROS: For pertinent review of systems please refer to history of  present illness  Physical Exam: There were no vitals taken for this visit.  Constitutional:  Well nourished. Alert and oriented, No acute distress. HEENT: [place] AT, moist mucus membranes.  Trachea midline Cardiovascular: No clubbing, cyanosis, or edema. Respiratory: Normal respiratory effort, no increased work of breathing. GU: No CVA tenderness.  No bladder fullness or masses.  Patient with circumcised/uncircumcised phallus. ***Foreskin easily retracted***  Urethral meatus is patent.  No penile discharge. No penile lesions or rashes. Scrotum without lesions, cysts, rashes and/or edema.  Testicles are located scrotally bilaterally. No masses are appreciated in the testicles. Left and right epididymis are normal. Rectal: Patient with  normal sphincter tone. Anus and perineum without scarring or rashes. No rectal masses are appreciated. Prostate is approximately *** grams, *** nodules are appreciated. Seminal vesicles are normal. Neurologic: Grossly intact, no focal deficits, moving all 4 extremities. Psychiatric:  Normal mood and affect.   Laboratory Data: Results for orders placed or performed in visit on 04/22/23  Comprehensive metabolic panel  Result Value Ref Range   Glucose 87 70 - 99 mg/dL   BUN 20 8 - 27 mg/dL   Creatinine, Ser 4.09 (H) 0.76 - 1.27 mg/dL   eGFR 60 >81 XB/JYN/8.29   BUN/Creatinine Ratio 16 10 - 24   Sodium 140 134 - 144 mmol/L   Potassium 4.2 3.5 - 5.2 mmol/L   Chloride 101 96 - 106 mmol/L   CO2 25 20 - 29 mmol/L   Calcium 9.9 8.6 - 10.2 mg/dL   Total Protein 7.3 6.0 - 8.5 g/dL   Albumin 3.8 (L) 3.9 - 4.9 g/dL   Globulin, Total 3.5 1.5 - 4.5 g/dL   Albumin/Globulin Ratio 1.1 (L) 1.2 - 2.2   Bilirubin Total 0.5 0.0 - 1.2 mg/dL   Alkaline Phosphatase 96 44 - 121 IU/L   AST 22 0 - 40 IU/L   ALT 12 0 - 44 IU/L  TSH  Result Value Ref Range   TSH 0.581 0.450 - 4.500 uIU/mL  I have reviewed the labs.    Pertinent Imaging: ***     Assessment & Plan:    1. Prostate cancer -PSA pending  -explained that his PSA continues to increase and this is a sign of biochemical occurrence  -he states he has panic attacks with injections and does not want to continue the Eligard -He also states that he would feel the same way taking an oral medication -NM PET (PSMA) scan pending  -Will have further discussions once the PET results are scan is available  2. LU TS -PVR 0 mL -at goal w/ Gemtesa 75 mg daily - script and samples given    Michiel Cowboy, PA-C  [facility] [address]  [address] [place], [place] [address] [phone]

## 2023-04-28 ENCOUNTER — Ambulatory Visit: Payer: 59 | Admitting: Urology

## 2023-05-17 ENCOUNTER — Ambulatory Visit: Payer: 59 | Admitting: Internal Medicine

## 2023-06-03 ENCOUNTER — Ambulatory Visit (INDEPENDENT_AMBULATORY_CARE_PROVIDER_SITE_OTHER): Payer: 59 | Admitting: Internal Medicine

## 2023-06-03 VITALS — BP 140/71 | HR 68 | Ht 65.0 in | Wt 120.0 lb

## 2023-06-03 DIAGNOSIS — I10 Essential (primary) hypertension: Secondary | ICD-10-CM

## 2023-06-03 DIAGNOSIS — R5381 Other malaise: Secondary | ICD-10-CM

## 2023-06-03 DIAGNOSIS — M7989 Other specified soft tissue disorders: Secondary | ICD-10-CM

## 2023-06-03 MED ORDER — AMLODIPINE BESYLATE 5 MG PO TABS
5.0000 mg | ORAL_TABLET | Freq: Every morning | ORAL | 0 refills | Status: DC
Start: 2023-06-03 — End: 2023-08-10

## 2023-06-03 MED ORDER — FUROSEMIDE 40 MG PO TABS: ORAL_TABLET | ORAL | 0 refills | Status: DC

## 2023-06-03 NOTE — Progress Notes (Signed)
Established Patient Office Visit  Subjective:  Patient ID: Alexander Avery, male    DOB: [birth year]  Age: 67 y.o. MRN: [id number]  Chief Complaint  Patient presents with   Follow-up    3 mo F/U lab results    C/o muscle weakness, gait difficulties, leaning to the left and fell yesterday. Also requests med refills.    No other concerns at this time.   Past Medical History:  Diagnosis Date   Anxiety    Arthritis    Cancer (HCC)    prostate   Cataract 2019   Diabetes mellitus without complication (HCC)    Diastolic CHF, chronic (HCC)    GERD (gastroesophageal reflux disease)    Hernia of abdominal wall 2015   History of stomach ulcers    Hypertension    Prostate cancer (HCC)    Seizures (HCC)    Sleep apnea     Past Surgical History:  Procedure Laterality Date   CATARACT EXTRACTION W/PHACO Right 03/30/2018   Procedure: CATARACT EXTRACTION PHACO AND INTRAOCULAR LENS PLACEMENT (IOC);  Surgeon: Nevada Crane, MD;  Location: [facility] ORS;  Service: Ophthalmology;  Laterality: Right;  [place] 01:09.5 AP% 10.4 CDE 7.98 Fluid pack lot # [id number]   CATARACT EXTRACTION W/PHACO Left 06/29/2018   Procedure: CATARACT EXTRACTION PHACO AND INTRAOCULAR LENS PLACEMENT (IOC);  Surgeon: Nevada Crane, MD;  Location: [facility] ORS;  Service: Ophthalmology;  Laterality: Left;  [place] 01:27.7 AP% 8.2 CDE 7.27 Fluid pack lot # [id number]   EYE SURGERY     HERNIA REPAIR     PROSTATE SURGERY     PROSTATE SURGERY  2016    Social History   Socioeconomic History   Marital status: Single    Spouse name: Not on file   Number of children: Not on file   Years of education: Not on file   Highest education level: Not on file  Occupational History   Not on file  Tobacco Use   Smoking status: Every Day    Current packs/day: 2.00    Types: Cigarettes    Passive exposure: Current   Smokeless tobacco: Never  Vaping Use   Vaping status: Never Used  Substance and Sexual Activity   Alcohol use:  Yes    Alcohol/week: 2.0 standard drinks of alcohol    Types: 2 Cans of beer per week    Comment: 2 beers twice a week   Drug use: No   Sexual activity: Not Currently  Other Topics Concern   Not on file  Social History Narrative   Not on file   Social Determinants of Health   Financial Resource Strain: Not on file  Food Insecurity: Not on file  Transportation Needs: Not on file  Physical Activity: Not on file  Stress: Not on file  Social Connections: Not on file  Intimate Partner Violence: Not on file    Family History  Problem Relation Age of Onset   Cancer Mother     Allergies  Allergen Reactions   Penicillins Hives and Other (See Comments)    Urinate on self, pass out Has patient had a PCN reaction causing immediate rash, facial/tongue/throat swelling, SOB or lightheadedness with hypotension: Yes Has patient had a PCN reaction causing severe rash involving mucus membranes or skin necrosis: No Has patient had a PCN reaction that required hospitalization: Yes- In hospital Has patient had a PCN reaction occurring within the last 10 years: Yes If all of the above answers are "NO", then may  proceed with Cephalosporin use.  Other reaction(Diontay Rosencrans): Other (See Comments) Urinate on self, pass out Has patient had a PCN reaction causing immediate rash, facial/tongue/throat swelling, SOB or lightheadedness with hypotension: Yes Has patient had a PCN reaction causing severe rash involving mucus membranes or skin necrosis: No Has patient had a PCN reaction that required hospitalization: Yes- In hospital Has patient had a PCN reaction occurring within the last 10 years: Yes If all of the above answers are "NO", then may proceed with Cephalosporin use. "I pass out"     Review of Systems  Constitutional: Negative.   HENT: Negative.    Eyes: Negative.   Respiratory: Negative.    Cardiovascular: Negative.   Gastrointestinal: Negative.   Genitourinary: Negative.   Musculoskeletal:         As in hpi  Skin: Negative.   Neurological: Negative.   Endo/Heme/Allergies: Negative.        Objective:   BP (!) 140/71   Pulse 68   Ht 5\' 5"  (1.651 m)   Wt 120 lb (54.4 kg)   SpO2 99%   BMI 19.97 kg/m   Vitals:   06/03/23 1451  BP: (!) 140/71  Pulse: 68  Height: 5\' 5"  (1.651 m)  Weight: 120 lb (54.4 kg)  SpO2: 99%  BMI (Calculated): 19.97    Physical Exam Vitals reviewed.  Constitutional:      Appearance: Normal appearance.  HENT:     Head: Normocephalic.     Left Ear: There is no impacted cerumen.     Nose: Nose normal.     Mouth/Throat:     Mouth: Mucous membranes are moist.     Pharynx: No posterior oropharyngeal erythema.  Eyes:     Extraocular Movements: Extraocular movements intact.     Pupils: Pupils are equal, round, and reactive to light.  Cardiovascular:     Rate and Rhythm: Regular rhythm.     Chest Wall: PMI is not displaced.     Pulses: Normal pulses.     Heart sounds: S1 normal and S2 normal. Murmur heard.     Crescendo decrescendo systolic murmur is present with a grade of 3/6.  Pulmonary:     Effort: Pulmonary effort is normal.     Breath sounds: Normal air entry. No rhonchi or rales.  Abdominal:     General: Abdomen is flat. Bowel sounds are normal. There is no distension.     Palpations: Abdomen is soft. There is no hepatomegaly, splenomegaly or mass.     Tenderness: There is no abdominal tenderness.  Musculoskeletal:        General: Normal range of motion.     Cervical back: Normal range of motion and neck supple.     Right lower leg: 3+ Edema present.     Left lower leg: 2+ Edema present.     Comments: Right foot walking boot in situ  Skin:    General: Skin is warm and dry.  Neurological:     General: No focal deficit present.     Mental Status: He is alert and oriented to person, place, and time.     Cranial Nerves: No cranial nerve deficit.     Motor: No weakness.     Gait: Gait abnormal (shuffling).  Psychiatric:         Mood and Affect: Mood normal.        Behavior: Behavior normal.      No results found for any visits on 06/03/23.  Assessment & Plan:  As per problem list. Resume lasix.  Problem List Items Addressed This Visit       Other   Leg swelling   Relevant Medications   furosemide (LASIX) 40 MG tablet   Physical debility - Primary   Relevant Orders   Ambulatory referral to Home Health   Other Visit Diagnoses     Primary hypertension       Relevant Medications   amLODipine (NORVASC) 5 MG tablet   furosemide (LASIX) 40 MG tablet       Return in about 2 weeks (around 06/17/2023).   Total time spent: 20 minutes  Luna Fuse, MD  06/03/2023   This document may have been prepared by [facility] Voice Recognition software and as such may include unintentional dictation errors.

## 2023-06-24 ENCOUNTER — Ambulatory Visit: Payer: 59 | Admitting: Internal Medicine

## 2023-07-24 ENCOUNTER — Emergency Department: Payer: 59

## 2023-07-24 ENCOUNTER — Other Ambulatory Visit: Payer: Self-pay

## 2023-07-24 ENCOUNTER — Emergency Department
Admission: EM | Admit: 2023-07-24 | Discharge: 2023-07-25 | Disposition: A | Discharge status: Home / Self Care | Payer: 59 | Attending: Emergency Medicine | Admitting: Emergency Medicine

## 2023-07-24 DIAGNOSIS — F332 Major depressive disorder, recurrent severe without psychotic features: Secondary | ICD-10-CM | POA: Diagnosis not present

## 2023-07-24 DIAGNOSIS — F32A Depression, unspecified: Secondary | ICD-10-CM

## 2023-07-24 DIAGNOSIS — I10 Essential (primary) hypertension: Secondary | ICD-10-CM | POA: Diagnosis not present

## 2023-07-24 DIAGNOSIS — E119 Type 2 diabetes mellitus without complications: Secondary | ICD-10-CM | POA: Insufficient documentation

## 2023-07-24 LAB — COMPREHENSIVE METABOLIC PANEL
ALT: 15 U/L (ref 0–44)
AST: 19 U/L (ref 15–41)
Albumin: 3.8 g/dL (ref 3.5–5.0)
Alkaline Phosphatase: 86 U/L (ref 38–126)
Anion gap: 6 (ref 5–15)
BUN: 27 mg/dL — ABNORMAL HIGH (ref 8–23)
CO2: 27 mmol/L (ref 22–32)
Calcium: 9.1 mg/dL (ref 8.9–10.3)
Chloride: 101 mmol/L (ref 98–111)
Creatinine, Ser: 1.9 mg/dL — ABNORMAL HIGH (ref 0.61–1.24)
GFR, Estimated: 38 mL/min — ABNORMAL LOW (ref >60)
Glucose, Bld: 89 mg/dL (ref 70–99)
Potassium: 4.3 mmol/L (ref 3.5–5.1)
Sodium: 134 mmol/L — ABNORMAL LOW (ref 135–145)
Total Bilirubin: 0.6 mg/dL (ref 0.3–1.2)
Total Protein: 8.1 g/dL (ref 6.5–8.1)

## 2023-07-24 LAB — URINE DRUG SCREEN, QUALITATIVE (ARMC ONLY)
Amphetamines, Ur Screen: NOT DETECTED
Barbiturates, Ur Screen: NOT DETECTED
Benzodiazepine, Ur Scrn: NOT DETECTED
Cannabinoid 50 Ng, Ur ~~LOC~~: POSITIVE — AB
Cocaine Metabolite,Ur ~~LOC~~: POSITIVE — AB
MDMA (Ecstasy)Ur Screen: NOT DETECTED
Methadone Scn, Ur: NOT DETECTED
Opiate, Ur Screen: NOT DETECTED
Phencyclidine (PCP) Ur S: NOT DETECTED
Tricyclic, Ur Screen: NOT DETECTED

## 2023-07-24 LAB — CBC
HCT: 33.2 % — ABNORMAL LOW (ref 39.0–52.0)
Hemoglobin: 11.4 g/dL — ABNORMAL LOW (ref 13.0–17.0)
MCH: 28 pg (ref 26.0–34.0)
MCHC: 34.3 g/dL (ref 30.0–36.0)
MCV: 81.6 fL (ref 80.0–100.0)
Platelets: 211 10*3/uL (ref 150–400)
RBC: 4.07 MIL/uL — ABNORMAL LOW (ref 4.22–5.81)
RDW: 15.4 % (ref 11.5–15.5)
WBC: 5 10*3/uL (ref 4.0–10.5)
nRBC: 0 % (ref 0.0–0.2)

## 2023-07-24 LAB — ACETAMINOPHEN LEVEL: Acetaminophen (Tylenol), Serum: <10 ug/mL — ABNORMAL LOW (ref 10–30)

## 2023-07-24 LAB — SALICYLATE LEVEL: Salicylate Lvl: <7 mg/dL — ABNORMAL LOW (ref 7.0–30.0)

## 2023-07-24 LAB — ETHANOL: Alcohol, Ethyl (B): <10 mg/dL

## 2023-07-24 MED ORDER — BUPRENORPHINE HCL-NALOXONE HCL 8-2 MG SL SUBL
1.0000 | SUBLINGUAL_TABLET | Freq: Two times a day (BID) | SUBLINGUAL | Status: DC
  Administered 2023-07-24 – 2023-07-25 (×3): 1 via SUBLINGUAL
  Filled 2023-07-24 (×4): qty 1

## 2023-07-24 MED ORDER — QUETIAPINE FUMARATE 25 MG PO TABS
50.0000 mg | ORAL_TABLET | Freq: Every day | ORAL | Status: DC
  Administered 2023-07-24: 50 mg via ORAL
  Filled 2023-07-24 (×2): qty 2

## 2023-07-24 MED ORDER — AMLODIPINE BESYLATE 5 MG PO TABS
5.0000 mg | ORAL_TABLET | Freq: Every morning | ORAL | Status: DC
  Administered 2023-07-24 – 2023-07-25 (×2): 5 mg via ORAL
  Filled 2023-07-24 (×2): qty 1

## 2023-07-24 NOTE — ED Triage Notes (Signed)
FIRST NURSE NOTE:  Pt arrived via [facility] from outside of a neighbor's house, hx of prostate CA, pt having ideations of SI, pt unable to care for himself, per EMS pt does not have a plan at this time.   Pt has walker that has pt labels on it that EMS placed at the nurse's station upon arrival.

## 2023-07-24 NOTE — ED Provider Notes (Signed)
   [facility] Provider Note    Event Date/Time   First MD Initiated Contact with Patient 07/24/23 [phone]     (approximate)  History   Chief Complaint: Psychiatric Evaluation  HPI  Alexander Avery is a 67 y.o. male with a past medical history of anxiety, diabetes, hypertension, presents to the emergency department for depression and vague suicidal ideation.  Patient has a reported history of prostate cancer as well as substance abuse, states he has been having thoughts of hurting himself although denies any active plan to do so and states he is never attempted to do so in the past.  Patient is here voluntarily wishing to speak to psychiatry.  Denies any medical complaints.  Physical Exam   Triage Vital Signs: ED Triage Vitals  Encounter Vitals Group     BP 07/24/23 0627 (!) 167/82     Systolic BP Percentile --      Diastolic BP Percentile --      Pulse Rate 07/24/23 0627 (!) 57     Resp 07/24/23 0627 16     Temp 07/24/23 0628 97.8 F (36.6 C)     Temp src --      SpO2 07/24/23 0627 97 %     Weight --      Height --      Head Circumference --      Peak Flow --      Pain Score 07/24/23 0628 0     Pain Loc --      Pain Education --      Exclude from Growth Chart --     Most recent vital signs: Vitals:   07/24/23 0627 07/24/23 0628  BP: (!) 167/82   Pulse: (!) 57   Resp: 16   Temp:  97.8 F (36.6 C)  SpO2: 97%     General: Awake, no distress.  CV:  Good peripheral perfusion.  Regular rate and rhythm  Resp:  Normal effort.  Equal breath sounds bilaterally.  Abd:  No distention.  Soft, nontender.  No rebound or guarding.  ED Results / Procedures / Treatments   MEDICATIONS ORDERED IN ED: Medications - No data to display   IMPRESSION / MDM / ASSESSMENT AND PLAN / ED COURSE  I reviewed the triage vital signs and the nursing notes.  Patient's presentation is most consistent with severe exacerbation of chronic illness.  Patient presents  emergency department for worsening depression with vague thoughts of wanting to hurt himself although denies any plan to do so.  Given no active plan and as the patient wishes to stay here voluntarily we will have psychiatry TTS evaluated do not believe the patient meets IVC criteria at this time.  Patient CBC is reassuring, chemistry does show renal insufficiency, slightly worse than baseline.  Will continue to orally hydrate.  Will have psychiatry and TTS evaluate.  Remainder lab work shows a negative alcohol salicylate and acetaminophen level.  Psychiatry seen and evaluated they will be admitting to their service once a bed is available for further treatment.  Patient is medically cleared for psychiatric disposition.  FINAL CLINICAL IMPRESSION(S) / ED DIAGNOSES   Depression   Note:  This document was prepared using Dragon voice recognition software and may include unintentional dictation errors.   Minna Antis, MD 07/24/23 1057

## 2023-07-24 NOTE — BH Assessment (Signed)
Adult/GERO MH  Referral information for Psychiatric Hospitalization faxed to:   [facility] ([facility]-[phone]---[phone])   [facility] [phone]- [phone], [phone])   [facility] ([phone] or [phone]),  . [facility] ([phone] -or- [phone]), 

## 2023-07-24 NOTE — Consult Note (Signed)
[name]   Reason for Consult:  Medication Management Referring Physician:  Minna Antis MD Patient Identification: Alexander Avery MRN:  [id number] Principal Diagnosis: <principal problem not specified> Diagnosis:  Active Problems:   Severe episode of recurrent major depressive disorder, without psychotic features (HCC)   Total Time spent with patient: 15 minutes  Subjective:   Alexander Avery is a 67 y.o. male patient admitted with suicidal ideation after "ambulance" transport. Patient states "I'm going to hurt myself and I need help. I jumped off a building before in [place]".  HPI:  Alexander Avery is a 67 y.o. male patient admitted with suicidal ideation. Patient states that he lives alone and plans to hurt himself because he no longer wants to live. He reports attempting suicide before by jumping off a roof but it only broke his ankle. Patient has a psychiatric history of recurrent and severe MDD, anxiety and polysubstance use disorder. Patient also reports auditory hallucination that curse him out, stating he last heard them last night. Patient reports not taking any medications other than suboxone prior to ED presentation.  Patient noted lying on bed, reluctant to engage at times. He appears disheveled with depressed mood and affect. He reports living alone at his residence.   Past Psychiatric History: polysubstance abuse, MDD severe without psychotic features, anxiety  Risk to Self:  active SI Risk to Others:  denies Prior Inpatient Therapy:  Previous inpatient psychiatric hospitalization at [facility] admitting on 11/27/21, with a discharge date of 11/30/21 Prior Outpatient Therapy:  [facility]  Past Medical History:  Past Medical History:  Diagnosis Date   Anxiety    Arthritis    Cancer (HCC)    prostate   Cataract 2019   Diabetes mellitus without complication (HCC)    Diastolic CHF, chronic (HCC)    GERD (gastroesophageal reflux disease)     Hernia of abdominal wall 2015   History of stomach ulcers    Hypertension    Prostate cancer (HCC)    Seizures (HCC)    Sleep apnea     Past Surgical History:  Procedure Laterality Date   CATARACT EXTRACTION W/PHACO Right 03/30/2018   Procedure: CATARACT EXTRACTION PHACO AND INTRAOCULAR LENS PLACEMENT (IOC);  Surgeon: Nevada Crane, MD;  Location: [facility] ORS;  Service: Ophthalmology;  Laterality: Right;  [place] 01:09.5 AP% 10.4 CDE 7.98 Fluid pack lot # [id number]   CATARACT EXTRACTION W/PHACO Left 06/29/2018   Procedure: CATARACT EXTRACTION PHACO AND INTRAOCULAR LENS PLACEMENT (IOC);  Surgeon: Nevada Crane, MD;  Location: [facility] ORS;  Service: Ophthalmology;  Laterality: Left;  [place] 01:27.7 AP% 8.2 CDE 7.27 Fluid pack lot # [id number]   EYE SURGERY     HERNIA REPAIR     PROSTATE SURGERY     PROSTATE SURGERY  2016   Family History:  Family History  Problem Relation Age of Onset   Cancer Mother    Family Psychiatric  History: none reported Social History:  Social History   Substance and Sexual Activity  Alcohol Use Yes   Alcohol/week: 2.0 standard drinks of alcohol   Types: 2 Cans of beer per week   Comment: 2 beers twice a week     Social History   Substance and Sexual Activity  Drug Use No    Social History   Socioeconomic History   Marital status: Single    Spouse name: Not on file   Number of children: Not on file   Years of education: Not  on file   Highest education level: Not on file  Occupational History   Not on file  Tobacco Use   Smoking status: Every Day    Current packs/day: 2.00    Types: Cigarettes    Passive exposure: Current   Smokeless tobacco: Never  Vaping Use   Vaping status: Never Used  Substance and Sexual Activity   Alcohol use: Yes    Alcohol/week: 2.0 standard drinks of alcohol    Types: 2 Cans of beer per week    Comment: 2 beers twice a week   Drug use: No   Sexual activity: Not Currently  Other Topics Concern   Not on file   Social History Narrative   Not on file   Social Determinants of Health   Financial Resource Strain: Not on file  Food Insecurity: Not on file  Transportation Needs: Not on file  Physical Activity: Not on file  Stress: Not on file  Social Connections: Not on file   Additional Social History:    Allergies:   Allergies  Allergen Reactions   Penicillins Hives and Other (See Comments)    Urinate on self, pass out Has patient had a PCN reaction causing immediate rash, facial/tongue/throat swelling, SOB or lightheadedness with hypotension: Yes Has patient had a PCN reaction causing severe rash involving mucus membranes or skin necrosis: No Has patient had a PCN reaction that required hospitalization: Yes- In hospital Has patient had a PCN reaction occurring within the last 10 years: Yes If all of the above answers are "NO", then may proceed with Cephalosporin use.  Other reaction(s): Other (See Comments) Urinate on self, pass out Has patient had a PCN reaction causing immediate rash, facial/tongue/throat swelling, SOB or lightheadedness with hypotension: Yes Has patient had a PCN reaction causing severe rash involving mucus membranes or skin necrosis: No Has patient had a PCN reaction that required hospitalization: Yes- In hospital Has patient had a PCN reaction occurring within the last 10 years: Yes If all of the above answers are "NO", then may proceed with Cephalosporin use. "I pass out"     Labs:  Results for orders placed or performed during the hospital encounter of 07/24/23 (from the past 48 hour(s))  Comprehensive metabolic panel     Status: Abnormal   Collection Time: 07/24/23  6:40 AM  Result Value Ref Range   Sodium 134 (L) 135 - 145 mmol/L   Potassium 4.3 3.5 - 5.1 mmol/L   Chloride 101 98 - 111 mmol/L   CO2 27 22 - 32 mmol/L   Glucose, Bld 89 70 - 99 mg/dL    Comment: Glucose reference range applies only to samples taken after fasting for at least 8 hours.    BUN 27 (H) 8 - 23 mg/dL   Creatinine, Ser 4.40 (H) 0.61 - 1.24 mg/dL   Calcium 9.1 8.9 - 34.7 mg/dL   Total Protein 8.1 6.5 - 8.1 g/dL   Albumin 3.8 3.5 - 5.0 g/dL   AST 19 15 - 41 U/L   ALT 15 0 - 44 U/L   Alkaline Phosphatase 86 38 - 126 U/L   Total Bilirubin 0.6 0.3 - 1.2 mg/dL   GFR, Estimated 38 (L) >60 mL/min    Comment: (NOTE) Calculated using the CKD-EPI Creatinine Equation (2021)    Anion gap 6 5 - 15    Comment: Performed at [facility], [address] [place], [place] [address]  Ethanol     Status: None   Collection  Time: 07/24/23  6:40 AM  Result Value Ref Range   Alcohol, Ethyl (B) <10 <10 mg/dL    Comment: (NOTE) Lowest detectable limit for serum alcohol is 10 mg/dL.  For medical purposes only. Performed at [facility], [address] [place], [place] [address]   Salicylate level     Status: Abnormal   Collection Time: 07/24/23  6:40 AM  Result Value Ref Range   Salicylate Lvl <7.0 (L) 7.0 - 30.0 mg/dL    Comment: Performed at [facility], [address] [place], [place] [address]  Acetaminophen level     Status: Abnormal   Collection Time: 07/24/23  6:40 AM  Result Value Ref Range   Acetaminophen (Tylenol), Serum <10 (L) 10 - 30 ug/mL    Comment: (NOTE) Therapeutic concentrations vary significantly. A range of 10-30 ug/mL  may be an effective concentration for many patients. However, some  are best treated at concentrations outside of this range. Acetaminophen concentrations >150 ug/mL at 4 hours after ingestion  and >50 ug/mL at 12 hours after ingestion are often associated with  toxic reactions.  Performed at [facility], [address] [place], [place] [address]   cbc     Status: Abnormal   Collection Time: 07/24/23  6:40 AM  Result Value Ref Range   WBC 5.0 4.0 - 10.5 K/uL   RBC 4.07 (L) 4.22 - 5.81 MIL/uL   Hemoglobin 11.4 (L) 13.0 - 17.0 g/dL   HCT 62.9 (L) 52.8 - 41.3 %   MCV 81.6 80.0 - 100.0  fL   MCH 28.0 26.0 - 34.0 pg   MCHC 34.3 30.0 - 36.0 g/dL   RDW 24.4 01.0 - 27.2 %   Platelets 211 150 - 400 K/uL   nRBC 0.0 0.0 - 0.2 %    Comment: Performed at [facility], [address] [place], [place] [address]    Current Facility-Administered Medications  Medication Dose Route Frequency Provider Last Rate Last Admin   QUEtiapine (SEROQUEL) tablet 50 mg  50 mg Oral QHS Dazia Lippold, NP       Current Outpatient Medications  Medication Sig Dispense Refill   ibuprofen (ADVIL) 800 MG tablet Take 1 tablet (800 mg total) by mouth every 6 (six) hours as needed. 60 tablet 1   amLODipine (NORVASC) 5 MG tablet Take 1 tablet (5 mg total) by mouth every morning. 90 tablet 0   furosemide (LASIX) 40 MG tablet Take to 1 tablet (40 mg total) by mouth ONCE OR TWICE daily (total daily dose maximum 80 mg) as needed for up to 3 days for increased leg swelling, shortness of breath, weight gain 5+ lbs over 1-2 days. Seek medical care if these symptoms are not improving with increased dose. 30 tablet 0   SUBOXONE 8-2 MG FILM Place under the tongue 2 (two) times daily.     Vibegron (GEMTESA) 75 MG TABS Take 75 mg by mouth daily. 90 tablet 3    Musculoskeletal: Strength & Muscle Tone:  n/a Gait & Station:  n/a Patient leans: N/A            Psychiatric Specialty Exam:  Presentation  General Appearance: Disheveled  Eye Contact:Minimal  Speech:Slow  Speech Volume:Decreased  Handedness:No data recorded  Mood and Affect  Mood:Depressed  Affect:Congruent   Thought Process  Thought Processes:Coherent  Descriptions of Associations:Intact  Orientation:Full (Time, Place and Person)  Thought Content:Illogical  History of Schizophrenia/Schizoaffective disorder:No data recorded Duration of Psychotic Symptoms:No data recorded  Hallucinations:Hallucinations: Auditory Description of Auditory Hallucinations: "cursing me out"  Ideas of Reference:None  Suicidal  Thoughts:Suicidal Thoughts: Yes, Active SI Active Intent and/or Plan: With Intent  Homicidal Thoughts:Homicidal Thoughts: No   Sensorium  Memory:Remote Good  Judgment:Poor  Insight:Other (comment) (utd)   Executive Functions  Concentration:Poor  Attention Span:Poor  Recall:Fair  [employer] of Knowledge:Other (comment) (utd)  Language:Good   Psychomotor Activity  Psychomotor Activity:Psychomotor Activity: Normal   Assets  Assets:Desire for Improvement   Sleep  Sleep:No data recorded  Physical Exam: Physical Exam Vitals and nursing note reviewed.  Neurological:     Mental Status: He is oriented to person, place, and time.    Review of Systems  Psychiatric/Behavioral:  Positive for depression and suicidal ideas.   All other systems reviewed and are negative.  Blood pressure (!) 167/82, pulse (!) 57, temperature 97.8 F (36.6 C), resp. rate 16, SpO2 97%. There is no height or weight on file to calculate BMI.  Treatment Plan Summary: Daily contact with patient to assess and evaluate symptoms and progress in treatment. Patient previously prescribed Seroquel for symptom management. Will restart Seroquel 50 mg PO at bedtime today.  Disposition: Recommend psychiatric Inpatient admission when medically cleared.  Mcneil Sober, NP 07/24/2023 12:02 PM

## 2023-07-24 NOTE — ED Notes (Signed)
VOL  

## 2023-07-24 NOTE — BH Assessment (Addendum)
Patient has been accepted to [facility] on tomm morning 07/25/23 after 8:00am Patient assigned to room [facility] Unit [facility]. Accepting physician is Dr. Roselyn Reef.  Call report to [phone].  Representative was Kia.   ER Staff is aware of it:  French Ana, ER Secretary  Dr. Rosalia Hammers, ER MD  Abby/Kaitlyn Patient's Nurse     Patient's Family/Support System [phone]) has been updated as well.

## 2023-07-25 DIAGNOSIS — F332 Major depressive disorder, recurrent severe without psychotic features: Secondary | ICD-10-CM | POA: Diagnosis not present

## 2023-07-25 NOTE — ED Provider Notes (Signed)
Emergency Medicine Observation Re-evaluation Note  Alexander Avery is a 67 y.o. male, seen on rounds today.  Pt initially presented to the ED for complaints of Psychiatric Evaluation  Currently, the patient is is no acute distress. Denies any concerns at this time.  Physical Exam  Blood pressure (!) 164/62, pulse 67, temperature 98.8 F (37.1 C), temperature source Oral, resp. rate 14, SpO2 98%.  Physical Exam: General: No apparent distress Pulm: Normal WOB Neuro: Moving all extremities Psych: Resting comfortably     ED Course / MDM     I have reviewed the labs performed to date as well as medications administered while in observation.  Recent changes in the last 24 hours include: No acute events overnight.  Plan   Current plan: Patient awaiting psychiatric disposition. Patient is not under full IVC at this time.    Saben Donigan, Layla Maw, DO 07/25/23 0700

## 2023-07-25 NOTE — ED Notes (Signed)
Attempted to call report x2. Sent to VM

## 2023-07-25 NOTE — Group Note (Deleted)

## 2023-07-25 NOTE — ED Notes (Signed)
Signature pad not working. Verbal consent for transfer to [facility]. Witnessed by [employer]

## 2023-07-25 NOTE — ED Notes (Signed)
Controlled meds picked up from pharmacy by this RN, to be sent with pt to [facility]

## 2023-07-25 NOTE — ED Provider Notes (Signed)
Vitals:   07/25/23 1032 07/25/23 1229  BP: (!) 180/71 (!) 167/68  Pulse: (!) 57 61  Resp: 16 16  Temp: 98 F (36.7 C) 98.2 F (36.8 C)  SpO2: 98% 97%     Patient currently ambulating with walker in the hallway without difficulty.  Discussed with him plan to transfer to [facility], he is in agreement.  Currently no distress ambulated to utilize the bathroom prior to transport.  Appropriate and stable for transfer   Sharyn Creamer, MD 07/25/23 1235

## 2023-07-25 NOTE — ED Notes (Addendum)
Report to [facility]

## 2023-07-25 NOTE — ED Notes (Signed)
Attempted to give report to [facility], no answer. HIPAA compliant VM left

## 2023-07-25 NOTE — ED Notes (Signed)
EMTALA reviewed by charge RN 

## 2023-07-25 NOTE — ED Notes (Signed)
Pt given warm blanket as requested.  

## 2023-07-25 NOTE — ED Notes (Signed)
Attempted to call report x4. Continue to be transferred to [facility] service. Charge RN aware

## 2023-08-02 ENCOUNTER — Ambulatory Visit: Payer: 59 | Admitting: Internal Medicine

## 2023-08-10 ENCOUNTER — Other Ambulatory Visit: Payer: Self-pay | Admitting: Internal Medicine

## 2023-08-10 DIAGNOSIS — I10 Essential (primary) hypertension: Secondary | ICD-10-CM

## 2023-08-30 ENCOUNTER — Ambulatory Visit: Payer: 59 | Admitting: Internal Medicine

## 2023-08-30 ENCOUNTER — Ambulatory Visit (INDEPENDENT_AMBULATORY_CARE_PROVIDER_SITE_OTHER): Payer: 59 | Admitting: Internal Medicine

## 2023-08-30 VITALS — BP 120/75 | HR 75 | Ht 65.0 in | Wt 108.0 lb

## 2023-08-30 DIAGNOSIS — I10 Essential (primary) hypertension: Secondary | ICD-10-CM | POA: Diagnosis not present

## 2023-08-30 DIAGNOSIS — R0781 Pleurodynia: Secondary | ICD-10-CM | POA: Diagnosis not present

## 2023-08-30 DIAGNOSIS — C61 Malignant neoplasm of prostate: Secondary | ICD-10-CM

## 2023-08-30 DIAGNOSIS — R5381 Other malaise: Secondary | ICD-10-CM | POA: Diagnosis not present

## 2023-08-30 MED ORDER — ACETAMINOPHEN 500 MG PO TABS
ORAL_TABLET | ORAL | 0 refills | Status: DC
Start: 2023-08-30 — End: 2024-01-12

## 2023-08-30 NOTE — Progress Notes (Signed)
Established Patient Office Visit  Subjective:  Patient ID: Alexander Avery, male    DOB: 01/14/1956  Age: 67 y.o. MRN: [id number]  Chief Complaint  Patient presents with   Follow-up    Discuss nursing home referral     No new complaints, here for lab review and medication refills. Complains that he never received HH services. Larey Seat again two weeks ago but refused to go to the hospital. C/o right flank pain where he landed.     No other concerns at this time.   Past Medical History:  Diagnosis Date   Anxiety    Arthritis    Cancer (HCC)    prostate   Cataract 2019   Diabetes mellitus without complication (HCC)    Diastolic CHF, chronic (HCC)    GERD (gastroesophageal reflux disease)    Hernia of abdominal wall 2015   History of stomach ulcers    Hypertension    Prostate cancer (HCC)    Seizures (HCC)    Sleep apnea     Past Surgical History:  Procedure Laterality Date   CATARACT EXTRACTION W/PHACO Right 03/30/2018   Procedure: CATARACT EXTRACTION PHACO AND INTRAOCULAR LENS PLACEMENT (IOC);  Surgeon: Nevada Crane, MD;  Location: [facility] ORS;  Service: Ophthalmology;  Laterality: Right;  [place] 01:09.5 AP% 10.4 CDE 7.98 Fluid pack lot # [id number]   CATARACT EXTRACTION W/PHACO Left 06/29/2018   Procedure: CATARACT EXTRACTION PHACO AND INTRAOCULAR LENS PLACEMENT (IOC);  Surgeon: Nevada Crane, MD;  Location: [facility] ORS;  Service: Ophthalmology;  Laterality: Left;  [place] 01:27.7 AP% 8.2 CDE 7.27 Fluid pack lot # [id number]   EYE SURGERY     HERNIA REPAIR     PROSTATE SURGERY     PROSTATE SURGERY  2016    Social History   Socioeconomic History   Marital status: Single    Spouse name: Not on file   Number of children: Not on file   Years of education: Not on file   Highest education level: Not on file  Occupational History   Not on file  Tobacco Use   Smoking status: Every Day    Current packs/day: 2.00    Types: Cigarettes    Passive exposure: Current    Smokeless tobacco: Never  Vaping Use   Vaping status: Never Used  Substance and Sexual Activity   Alcohol use: Yes    Alcohol/week: 2.0 standard drinks of alcohol    Types: 2 Cans of beer per week    Comment: 2 beers twice a week   Drug use: Yes   Sexual activity: Not Currently  Other Topics Concern   Not on file  Social History Narrative   Not on file   Social Determinants of Health   Financial Resource Strain: Not on file  Food Insecurity: Not on file  Transportation Needs: Not on file  Physical Activity: Not on file  Stress: Not on file  Social Connections: Not on file  Intimate Partner Violence: Not on file    Family History  Problem Relation Age of Onset   Cancer Mother     Allergies  Allergen Reactions   Penicillins Hives and Other (See Comments)    Urinate on self, pass out Has patient had a PCN reaction causing immediate rash, facial/tongue/throat swelling, SOB or lightheadedness with hypotension: Yes Has patient had a PCN reaction causing severe rash involving mucus membranes or skin necrosis: No Has patient had a PCN reaction that required hospitalization: Yes- In hospital Has patient  had a PCN reaction occurring within the last 10 years: Yes If all of the above answers are "NO", then may proceed with Cephalosporin use.  Other reaction(Karlon Schlafer): Other (See Comments) Urinate on self, pass out Has patient had a PCN reaction causing immediate rash, facial/tongue/throat swelling, SOB or lightheadedness with hypotension: Yes Has patient had a PCN reaction causing severe rash involving mucus membranes or skin necrosis: No Has patient had a PCN reaction that required hospitalization: Yes- In hospital Has patient had a PCN reaction occurring within the last 10 years: Yes If all of the above answers are "NO", then may proceed with Cephalosporin use. "I pass out"     Review of Systems  Constitutional: Negative.   HENT: Negative.    Eyes: Negative.   Respiratory:  Negative.    Cardiovascular: Negative.   Gastrointestinal: Negative.   Genitourinary: Negative.   Musculoskeletal:        As in hpi  Skin: Negative.   Neurological: Negative.   Endo/Heme/Allergies: Negative.        Objective:   BP 120/75   Pulse 75   Ht 5\' 5"  (1.651 m)   Wt 108 lb (49 kg)   SpO2 97%   BMI 17.97 kg/m   Vitals:   08/30/23 1459  BP: 120/75  Pulse: 75  Height: 5\' 5"  (1.651 m)  Weight: 108 lb (49 kg)  SpO2: 97%  BMI (Calculated): 17.97    Physical Exam Vitals reviewed.  Constitutional:      Appearance: Normal appearance.  HENT:     Head: Normocephalic.     Left Ear: There is no impacted cerumen.     Nose: Nose normal.     Mouth/Throat:     Mouth: Mucous membranes are moist.     Pharynx: No posterior oropharyngeal erythema.  Eyes:     Extraocular Movements: Extraocular movements intact.     Pupils: Pupils are equal, round, and reactive to light.  Cardiovascular:     Rate and Rhythm: Regular rhythm.     Chest Wall: PMI is not displaced.     Pulses: Normal pulses.     Heart sounds: S1 normal and S2 normal. Murmur heard.     Crescendo decrescendo systolic murmur is present with a grade of 3/6.  Pulmonary:     Effort: Pulmonary effort is normal.     Breath sounds: Normal air entry. No rhonchi or rales.  Abdominal:     General: Abdomen is flat. Bowel sounds are normal. There is no distension.     Palpations: Abdomen is soft. There is no hepatomegaly, splenomegaly or mass.     Tenderness: There is no abdominal tenderness.  Musculoskeletal:        General: Normal range of motion.     Cervical back: Normal range of motion and neck supple.     Right lower leg: 3+ Edema present.     Left lower leg: 2+ Edema present.     Comments: Right foot walking boot in situ  Skin:    General: Skin is warm and dry.  Neurological:     General: No focal deficit present.     Mental Status: He is alert and oriented to person, place, and time.     Cranial Nerves:  No cranial nerve deficit.     Motor: No weakness.     Gait: Gait abnormal (shuffling).  Psychiatric:        Mood and Affect: Mood normal.        Behavior:  Behavior normal.      No results found for any visits on 08/30/23.      Assessment & Plan:   As per problem list  Problem List Items Addressed This Visit       Cardiovascular and Mediastinum   Primary hypertension     Genitourinary   Prostate cancer (HCC)     Other   Physical debility - Primary   Rib pain on right side   Relevant Medications   acetaminophen (TYLENOL) 500 MG tablet    Return in about 1 month (around 09/30/2023).   Total time spent: 20 minutes  Luna Fuse, MD  08/30/2023   This document may have been prepared by [facility] Voice Recognition software and as such may include unintentional dictation errors.

## 2023-09-02 ENCOUNTER — Telehealth: Payer: Self-pay

## 2023-09-02 DIAGNOSIS — R5381 Other malaise: Secondary | ICD-10-CM

## 2023-09-02 NOTE — Telephone Encounter (Signed)
I spoke with [facility] and they claim to have never received the referral that you sent over in July can you resend it

## 2023-09-06 ENCOUNTER — Encounter: Payer: Self-pay | Admitting: Podiatry

## 2023-09-06 ENCOUNTER — Ambulatory Visit (INDEPENDENT_AMBULATORY_CARE_PROVIDER_SITE_OTHER): Payer: 59 | Admitting: Podiatry

## 2023-09-06 VITALS — BP 128/59 | HR 59

## 2023-09-06 DIAGNOSIS — M79674 Pain in right toe(s): Secondary | ICD-10-CM | POA: Diagnosis not present

## 2023-09-06 DIAGNOSIS — M79675 Pain in left toe(s): Secondary | ICD-10-CM

## 2023-09-06 DIAGNOSIS — B351 Tinea unguium: Secondary | ICD-10-CM | POA: Diagnosis not present

## 2023-09-06 NOTE — Addendum Note (Signed)
Addended by: Aundria Mems AHMAD on: 09/06/2023 01:14 PM   Modules accepted: Orders

## 2023-09-06 NOTE — Progress Notes (Signed)
Subjective:  Patient ID: Alexander Avery, male    DOB: Jun 05, 1956,  MRN: [id number]  Chief Complaint  Patient presents with   Nail Problem    "My toenails, I don't think I want a shot today.  It makes my feet hurt."   67 y.o. male returns for the above complaint.  Patient presents with thickened elongated dystrophic toenails x10 mild pain on palpation.  Patient is unable to debride down himself he would like for me to do it denies any other acute complaints.  Objective:   Vitals:   09/06/23 1117  BP: (!) 128/59  Pulse: (!) 59   Podiatric Exam: Vascular: dorsalis pedis and posterior tibial pulses are palpable bilateral. Capillary return is immediate. Temperature gradient is WNL. Skin turgor WNL  Sensorium: Normal Semmes Weinstein monofilament test. Normal tactile sensation bilaterally. Nail Exam: Pt has thick disfigured discolored nails with subungual debris noted bilateral entire nail hallux through fifth toenails.  Pain on palpation to the nails. Ulcer Exam: There is no evidence of ulcer or pre-ulcerative changes or infection. Orthopedic Exam: Muscle tone and strength are WNL. No limitations in general ROM. No crepitus or effusions noted.  Skin: No Porokeratosis. No infection or ulcers    Assessment & Plan:   1. Pain due to onychomycosis of toenails of both feet      Patient was evaluated and treated and all questions answered.  Onychomycosis with pain  -Nails palliatively debrided as below. -Educated on self-care  Procedure: Nail Debridement Rationale: pain  Type of Debridement: manual, sharp debridement. Instrumentation: Nail nipper, rotary burr. Number of Nails: 10  Procedures and Treatment: Consent by patient was obtained for treatment procedures. The patient understood the discussion of treatment and procedures well. All questions were answered thoroughly reviewed. Debridement of mycotic and hypertrophic toenails, 1 through 5 bilateral and clearing of subungual  debris. No ulceration, no infection noted.  Return Visit-Office Procedure: Patient instructed to return to the office for a follow up visit 3 months for continued evaluation and treatment.  Nicholes Rough, DPM    No follow-ups on file.

## 2023-09-07 ENCOUNTER — Telehealth: Payer: Self-pay

## 2023-09-08 NOTE — Telephone Encounter (Signed)
Have the paper work, will get it filled out

## 2023-09-29 ENCOUNTER — Telehealth: Payer: Self-pay
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
## 2023-10-04 ENCOUNTER — Encounter: Payer: Self-pay | Admitting: Internal Medicine

## 2023-10-04 ENCOUNTER — Ambulatory Visit (INDEPENDENT_AMBULATORY_CARE_PROVIDER_SITE_OTHER): Payer: 59 | Admitting: Internal Medicine

## 2023-10-04 VITALS — BP 142/87 | HR 62 | Ht 65.0 in | Wt 114.0 lb

## 2023-10-04 DIAGNOSIS — K922 Gastrointestinal hemorrhage, unspecified: Secondary | ICD-10-CM | POA: Diagnosis not present

## 2023-10-04 DIAGNOSIS — R5381 Other malaise: Secondary | ICD-10-CM | POA: Diagnosis not present

## 2023-10-04 DIAGNOSIS — C61 Malignant neoplasm of prostate: Secondary | ICD-10-CM | POA: Diagnosis not present

## 2023-10-04 DIAGNOSIS — I10 Essential (primary) hypertension: Secondary | ICD-10-CM | POA: Diagnosis not present

## 2023-10-04 NOTE — Progress Notes (Signed)
Established Patient Office Visit  Subjective:  Patient ID: Alexander Avery, male    DOB: 1956-02-19  Age: 67 y.o. MRN: [id number]  Chief Complaint  Patient presents with   Follow-up    C/o bleeding per rectum with dizziness x 4 days but denies any abd pain, melena coffee grounds or frank emesis.Caregiver reports worsening mobility however patient admits to not undergoing PT.     No other concerns at this time.   Past Medical History:  Diagnosis Date   Anxiety    Arthritis    Cancer (HCC)    prostate   Cataract 2019   Diabetes mellitus without complication (HCC)    Diastolic CHF, chronic (HCC)    GERD (gastroesophageal reflux disease)    Hernia of abdominal wall 2015   History of stomach ulcers    Hypertension    Prostate cancer (HCC)    Seizures (HCC)    Sleep apnea     Past Surgical History:  Procedure Laterality Date   CATARACT EXTRACTION W/PHACO Right 03/30/2018   Procedure: CATARACT EXTRACTION PHACO AND INTRAOCULAR LENS PLACEMENT (IOC);  Surgeon: Nevada Crane, MD;  Location: [facility] ORS;  Service: Ophthalmology;  Laterality: Right;  [place] 01:09.5 AP% 10.4 CDE 7.98 Fluid pack lot # [id number]   CATARACT EXTRACTION W/PHACO Left 06/29/2018   Procedure: CATARACT EXTRACTION PHACO AND INTRAOCULAR LENS PLACEMENT (IOC);  Surgeon: Nevada Crane, MD;  Location: [facility] ORS;  Service: Ophthalmology;  Laterality: Left;  [place] 01:27.7 AP% 8.2 CDE 7.27 Fluid pack lot # [id number]   EYE SURGERY     HERNIA REPAIR     PROSTATE SURGERY     PROSTATE SURGERY  2016    Social History   Socioeconomic History   Marital status: Single    Spouse name: Not on file   Number of children: Not on file   Years of education: Not on file   Highest education level: Not on file  Occupational History   Not on file  Tobacco Use   Smoking status: Every Day    Current packs/day: 2.00    Types: Cigarettes    Passive exposure: Current   Smokeless tobacco: Never  Vaping Use   Vaping status:  Never Used  Substance and Sexual Activity   Alcohol use: Yes    Alcohol/week: 2.0 standard drinks of alcohol    Types: 2 Cans of beer per week    Comment: 2 beers twice a week   Drug use: Yes    Types: Marijuana   Sexual activity: Not Currently  Other Topics Concern   Not on file  Social History Narrative   Not on file   Social Determinants of Health   Financial Resource Strain: Not on file  Food Insecurity: Not on file  Transportation Needs: Not on file  Physical Activity: Not on file  Stress: Not on file  Social Connections: Not on file  Intimate Partner Violence: Not on file    Family History  Problem Relation Age of Onset   Cancer Mother     Allergies  Allergen Reactions   Penicillins Hives and Other (See Comments)    Urinate on self, pass out Has patient had a PCN reaction causing immediate rash, facial/tongue/throat swelling, SOB or lightheadedness with hypotension: Yes Has patient had a PCN reaction causing severe rash involving mucus membranes or skin necrosis: No Has patient had a PCN reaction that required hospitalization: Yes- In hospital Has patient had a PCN reaction occurring within the last 10  years: Yes If all of the above answers are "NO", then may proceed with Cephalosporin use.  Other reaction(Kimmy Totten): Other (See Comments) Urinate on self, pass out Has patient had a PCN reaction causing immediate rash, facial/tongue/throat swelling, SOB or lightheadedness with hypotension: Yes Has patient had a PCN reaction causing severe rash involving mucus membranes or skin necrosis: No Has patient had a PCN reaction that required hospitalization: Yes- In hospital Has patient had a PCN reaction occurring within the last 10 years: Yes If all of the above answers are "NO", then may proceed with Cephalosporin use. "I pass out"     Outpatient Medications Prior to Visit  Medication Sig   acetaminophen (TYLENOL) 500 MG tablet Take 1-2 tabs as needed every 6 hrs for pain    amLODipine (NORVASC) 5 MG tablet TAKE 1 TABLET BY MOUTH IN THE MORNING   furosemide (LASIX) 40 MG tablet Take to 1 tablet (40 mg total) by mouth ONCE OR TWICE daily (total daily dose maximum 80 mg) as needed for up to 3 days for increased leg swelling, shortness of breath, weight gain 5+ lbs over 1-2 days. Seek medical care if these symptoms are not improving with increased dose.   ibuprofen (ADVIL) 800 MG tablet Take 1 tablet (800 mg total) by mouth every 6 (six) hours as needed.   SUBOXONE 8-2 MG FILM Place under the tongue 2 (two) times daily.   Vibegron (GEMTESA) 75 MG TABS Take 75 mg by mouth daily.   No facility-administered medications prior to visit.    Review of Systems  Constitutional: Negative.   HENT: Negative.    Eyes: Negative.   Respiratory: Negative.    Cardiovascular: Negative.   Gastrointestinal:  Positive for blood in stool. Negative for constipation.  Genitourinary: Negative.   Musculoskeletal:        As in hpi  Skin: Negative.   Neurological: Negative.   Endo/Heme/Allergies: Negative.        Objective:   BP (!) 142/87   Pulse 62   Ht 5\' 5"  (1.651 m)   Wt 114 lb (51.7 kg)   SpO2 99%   BMI 18.97 kg/m   Vitals:   10/04/23 1454  BP: (!) 142/87  Pulse: 62  Height: 5\' 5"  (1.651 m)  Weight: 114 lb (51.7 kg)  SpO2: 99%  BMI (Calculated): 18.97    Physical Exam Vitals reviewed.  Constitutional:      Appearance: Normal appearance.  HENT:     Head: Normocephalic.     Left Ear: There is no impacted cerumen.     Nose: Nose normal.     Mouth/Throat:     Mouth: Mucous membranes are moist.     Pharynx: No posterior oropharyngeal erythema.  Eyes:     Extraocular Movements: Extraocular movements intact.     Pupils: Pupils are equal, round, and reactive to light.  Cardiovascular:     Rate and Rhythm: Regular rhythm.     Chest Wall: PMI is not displaced.     Pulses: Normal pulses.     Heart sounds: Normal heart sounds. No murmur heard. Pulmonary:      Effort: Pulmonary effort is normal.     Breath sounds: Normal air entry. No rhonchi or rales.  Abdominal:     General: Abdomen is flat. Bowel sounds are normal. There is no distension.     Palpations: Abdomen is soft. There is no hepatomegaly, splenomegaly or mass.     Tenderness: There is no abdominal tenderness.  Musculoskeletal:  General: Normal range of motion.     Cervical back: Normal range of motion and neck supple.     Right lower leg: No edema.     Left lower leg: No edema.  Skin:    General: Skin is warm and dry.  Neurological:     General: No focal deficit present.     Mental Status: He is alert and oriented to person, place, and time.     Cranial Nerves: No cranial nerve deficit.     Motor: No weakness.  Psychiatric:        Mood and Affect: Mood normal.        Behavior: Behavior normal.      No results found for any visits on 10/04/23.      Assessment & Plan:  Send to the ER for further evaluation of GI bleed. Problem List Items Addressed This Visit       Cardiovascular and Mediastinum   Primary hypertension     Digestive   Gastrointestinal hemorrhage - Primary     Genitourinary   Prostate cancer [facility])     Other   Physical debility    Return if symptoms worsen or fail to improve.   Total time spent: 30 minutes  Luna Fuse, MD  10/04/2023   This document may have been prepared by [facility] Voice Recognition software and as such may include unintentional dictation errors.

## 2023-10-06 ENCOUNTER — Emergency Department: Payer: 59

## 2023-10-06 ENCOUNTER — Other Ambulatory Visit: Payer: Self-pay

## 2023-10-06 ENCOUNTER — Emergency Department
Admission: EM | Admit: 2023-10-06 | Discharge: 2023-10-06 | Disposition: A | Discharge status: Home / Self Care | Payer: 59 | Attending: Emergency Medicine | Admitting: Emergency Medicine

## 2023-10-06 ENCOUNTER — Encounter: Payer: Self-pay | Admitting: Emergency Medicine

## 2023-10-06 DIAGNOSIS — M79605 Pain in left leg: Secondary | ICD-10-CM | POA: Diagnosis present

## 2023-10-06 DIAGNOSIS — C61 Malignant neoplasm of prostate: Secondary | ICD-10-CM | POA: Diagnosis not present

## 2023-10-06 DIAGNOSIS — I10 Essential (primary) hypertension: Secondary | ICD-10-CM | POA: Insufficient documentation

## 2023-10-06 DIAGNOSIS — S0990XA Unspecified injury of head, initial encounter: Secondary | ICD-10-CM | POA: Diagnosis present

## 2023-10-06 DIAGNOSIS — Z7409 Other reduced mobility: Secondary | ICD-10-CM | POA: Insufficient documentation

## 2023-10-06 DIAGNOSIS — E119 Type 2 diabetes mellitus without complications: Secondary | ICD-10-CM | POA: Diagnosis not present

## 2023-10-06 DIAGNOSIS — W19XXXA Unspecified fall, initial encounter: Secondary | ICD-10-CM | POA: Diagnosis not present

## 2023-10-06 DIAGNOSIS — R296 Repeated falls: Secondary | ICD-10-CM | POA: Insufficient documentation

## 2023-10-06 DIAGNOSIS — F1911 Other psychoactive substance abuse, in remission: Secondary | ICD-10-CM | POA: Insufficient documentation

## 2023-10-06 DIAGNOSIS — R531 Weakness: Secondary | ICD-10-CM | POA: Insufficient documentation

## 2023-10-06 DIAGNOSIS — M25552 Pain in left hip: Secondary | ICD-10-CM | POA: Insufficient documentation

## 2023-10-06 DIAGNOSIS — Z8546 Personal history of malignant neoplasm of prostate: Secondary | ICD-10-CM | POA: Insufficient documentation

## 2023-10-06 DIAGNOSIS — R5381 Other malaise: Secondary | ICD-10-CM | POA: Diagnosis present

## 2023-10-06 LAB — CBC WITH DIFFERENTIAL/PLATELET
Abs Immature Granulocytes: 0.01 10*3/uL (ref 0.00–0.07)
Basophils Absolute: 0 10*3/uL (ref 0.0–0.1)
Basophils Relative: 1 %
Eosinophils Absolute: 0.2 10*3/uL (ref 0.0–0.5)
Eosinophils Relative: 6 %
HCT: 30.5 % — ABNORMAL LOW (ref 39.0–52.0)
Hemoglobin: 10.9 g/dL — ABNORMAL LOW (ref 13.0–17.0)
Immature Granulocytes: 0 %
Lymphocytes Relative: 26 %
Lymphs Abs: 1.1 10*3/uL (ref 0.7–4.0)
MCH: 29.4 pg (ref 26.0–34.0)
MCHC: 35.7 g/dL (ref 30.0–36.0)
MCV: 82.2 fL (ref 80.0–100.0)
Monocytes Absolute: 0.3 10*3/uL (ref 0.1–1.0)
Monocytes Relative: 7 %
Neutro Abs: 2.5 10*3/uL (ref 1.7–7.7)
Neutrophils Relative %: 60 %
Platelets: 203 10*3/uL (ref 150–400)
RBC: 3.71 MIL/uL — ABNORMAL LOW (ref 4.22–5.81)
RDW: 15.2 % (ref 11.5–15.5)
WBC: 4.2 10*3/uL (ref 4.0–10.5)
nRBC: 0 % (ref 0.0–0.2)

## 2023-10-06 LAB — URINALYSIS, W/ REFLEX TO CULTURE (INFECTION SUSPECTED)
Bilirubin Urine: NEGATIVE
Glucose, UA: NEGATIVE mg/dL
Hgb urine dipstick: NEGATIVE
Ketones, ur: NEGATIVE mg/dL
Leukocytes,Ua: NEGATIVE
Nitrite: NEGATIVE
Protein, ur: 30 mg/dL — AB
Specific Gravity, Urine: 1.023 (ref 1.005–1.030)
pH: 5 (ref 5.0–8.0)

## 2023-10-06 LAB — COMPREHENSIVE METABOLIC PANEL
ALT: 17 U/L (ref 0–44)
AST: 23 U/L (ref 15–41)
Albumin: 3.5 g/dL (ref 3.5–5.0)
Alkaline Phosphatase: 85 U/L (ref 38–126)
Anion gap: 10 (ref 5–15)
BUN: 31 mg/dL — ABNORMAL HIGH (ref 8–23)
CO2: 28 mmol/L (ref 22–32)
Calcium: 9 mg/dL (ref 8.9–10.3)
Chloride: 101 mmol/L (ref 98–111)
Creatinine, Ser: 1.84 mg/dL — ABNORMAL HIGH (ref 0.61–1.24)
GFR, Estimated: 39 mL/min — ABNORMAL LOW (ref >60)
Glucose, Bld: 91 mg/dL (ref 70–99)
Potassium: 4 mmol/L (ref 3.5–5.1)
Sodium: 139 mmol/L (ref 135–145)
Total Bilirubin: 0.6 mg/dL (ref <1.2)
Total Protein: 7.6 g/dL (ref 6.5–8.1)

## 2023-10-06 LAB — ETHANOL: Alcohol, Ethyl (B): <10 mg/dL (ref <10)

## 2023-10-06 NOTE — ED Notes (Signed)
Pt to x-ray & CT

## 2023-10-06 NOTE — Discharge Instructions (Signed)
Your lab tests and imaging were all okay today.  Please continue to follow-up with your doctor and physical therapy for further evaluation of your symptoms.

## 2023-10-06 NOTE — ED Triage Notes (Signed)
Patient to ED via POV for a fall. Patient states he has been falling everyday. Currently has prostate cancer- no treatment at this time. C/o pain in bilateral hips, left shoulder, and bilateral elbows. Ambulatory with walker. States he hit head on floor this AM- denies LOC or blood thinners.

## 2023-10-06 NOTE — ED Provider Notes (Signed)
[facility] Provider Note    Event Date/Time   First MD Initiated Contact with Patient 10/06/23 1144     (approximate)   History   Chief Complaint: Fall   HPI  Alexander Avery is a 67 y.o. male with a history of pock prostate cancer, polysubstance abuse, hypertension diabetes who comes to the ED complaining of a fall.  Reports he has been falling every day over the past week.  He has seen his doctor 2 times over the past week as well.  He does live in a senior living development and has home health and PT services          Physical Exam   Triage Vital Signs: ED Triage Vitals  Encounter Vitals Group     BP 10/06/23 1024 131/69     Systolic BP Percentile --      Diastolic BP Percentile --      Pulse Rate 10/06/23 1024 (!) 56     Resp 10/06/23 1024 18     Temp 10/06/23 1024 98.1 F (36.7 C)     Temp Source 10/06/23 1024 Oral     SpO2 10/06/23 1024 100 %     Weight 10/06/23 1026 114 lb (51.7 kg)     Height 10/06/23 1026 5\' 5"  (1.651 m)     Head Circumference --      Peak Flow --      Pain Score 10/06/23 1026 8     Pain Loc --      Pain Education --      Exclude from Growth Chart --     Most recent vital signs: Vitals:   10/06/23 1024  BP: 131/69  Pulse: (!) 56  Resp: 18  Temp: 98.1 F (36.7 C)  SpO2: 100%    General: Awake, no distress.  CV:  Good peripheral perfusion.  Regular rate and rhythm.  Normal distal pulses Resp:  Normal effort.  Clear to auscultation bilaterally Abd:  No distention.  Soft, nontender.  Bladder nonpalpable.  Tenderness over the left hip greater trochanter without instability. Other:  Dry oral mucosa.  Cranial nerves III through XII intact.  No motor drift.  Normal finger-to-nose bilaterally.   ED Results / Procedures / Treatments   Labs (all labs ordered are listed, but only abnormal results are displayed) Labs Reviewed  COMPREHENSIVE METABOLIC PANEL - Abnormal; Notable for the following  components:      Result Value   BUN 31 (*)    Creatinine, Ser 1.84 (*)    GFR, Estimated 39 (*)    All other components within normal limits  CBC WITH DIFFERENTIAL/PLATELET - Abnormal; Notable for the following components:   RBC 3.71 (*)    Hemoglobin 10.9 (*)    HCT 30.5 (*)    All other components within normal limits  URINALYSIS, W/ REFLEX TO CULTURE (INFECTION SUSPECTED) - Abnormal; Notable for the following components:   Color, Urine YELLOW (*)    APPearance CLEAR (*)    Protein, ur 30 (*)    Bacteria, UA RARE (*)    All other components within normal limits  ETHANOL     EKG    RADIOLOGY CT head interpreted by me, no intracranial hemorrhage.  Radiology report reviewed.  X-ray left hip and pelvis negative for fracture.  Ultrasound left lower extremity negative for DVT.   PROCEDURES:  Procedures   MEDICATIONS ORDERED IN ED: Medications - No data to display   IMPRESSION / MDM /  ASSESSMENT AND PLAN / ED COURSE  I reviewed the triage vital signs and the nursing notes.  DDx: Dehydration, AKI, electrolyte abnormality, UTI, intracranial hemorrhage, hip fracture, pelvis fracture, deconditioning  Patient's presentation is most consistent with acute presentation with potential threat to life or bodily function.  Patient presents with multiple falls at home, feels like he is having gradually worsening generalized weakness.  He has been referred to PT, currently using a rolling walker but is being evaluated for mobility wheelchair.  Has in-home services.  Will obtain CT head and x-ray left hip and pelvis to eval for traumatic injuries.  Check labs, give IV fluids.   ----------------------------------------- 2:35 PM on 10/06/2023 ----------------------------------------- Workup reassuring.  Patient appears to be in stable condition for which he is being evaluated by primary care, receiving appropriate level of assistance, stable for discharge     FINAL CLINICAL  IMPRESSION(S) / ED DIAGNOSES   Final diagnoses:  Physical deconditioning     Rx / DC Orders   ED Discharge Orders     None        Note:  This document was prepared using Dragon voice recognition software and may include unintentional dictation errors.   Sharman Cheek, MD 10/06/23 1435

## 2023-10-06 NOTE — ED Notes (Signed)
Pt was able to transfer to wheelchair to be wheeled to the restroom in the hall. Pt was given a urine cup and instructed to provide a urine sample and to pull the cord when they were done using the restroom.

## 2023-11-15 ENCOUNTER — Emergency Department
Admission: EM | Admit: 2023-11-15 | Discharge: 2023-11-15 | Disposition: A | Discharge status: Home / Self Care | Payer: 59 | Attending: Emergency Medicine | Admitting: Emergency Medicine

## 2023-11-15 ENCOUNTER — Emergency Department: Payer: 59

## 2023-11-15 ENCOUNTER — Other Ambulatory Visit: Payer: Self-pay

## 2023-11-15 DIAGNOSIS — S0990XA Unspecified injury of head, initial encounter: Secondary | ICD-10-CM | POA: Diagnosis present

## 2023-11-15 DIAGNOSIS — I509 Heart failure, unspecified: Secondary | ICD-10-CM | POA: Diagnosis not present

## 2023-11-15 DIAGNOSIS — E119 Type 2 diabetes mellitus without complications: Secondary | ICD-10-CM | POA: Insufficient documentation

## 2023-11-15 DIAGNOSIS — M7918 Myalgia, other site: Secondary | ICD-10-CM

## 2023-11-15 DIAGNOSIS — S0181XA Laceration without foreign body of other part of head, initial encounter: Secondary | ICD-10-CM | POA: Diagnosis not present

## 2023-11-15 DIAGNOSIS — I110 Hypertensive heart disease with heart failure: Secondary | ICD-10-CM | POA: Diagnosis not present

## 2023-11-15 MED ORDER — HYDROCODONE-ACETAMINOPHEN 5-325 MG PO TABS: 1.0000 | ORAL_TABLET | Freq: Three times a day (TID) | ORAL | 0 refills | Status: AC | PRN

## 2023-11-15 MED ORDER — HYDROCODONE-ACETAMINOPHEN 5-325 MG PO TABS
1.0000 | ORAL_TABLET | Freq: Once | ORAL | Status: AC
  Administered 2023-11-15: 1 via ORAL
  Filled 2023-11-15: qty 1

## 2023-11-15 MED ORDER — LIDOCAINE HCL (PF) 1 % IJ SOLN
5.0000 mL | Freq: Once | INTRAMUSCULAR | Status: AC
  Administered 2023-11-15: 5 mL
  Filled 2023-11-15: qty 5

## 2023-11-15 MED ORDER — BACITRACIN ZINC 500 UNIT/GM EX OINT
TOPICAL_OINTMENT | Freq: Once | CUTANEOUS | Status: AC
  Administered 2023-11-15: 1 via TOPICAL
  Filled 2023-11-15: qty 0.9
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
## 2023-11-15 NOTE — Discharge Instructions (Addendum)
Your exam is reassuring. You have decided to leave before all of your CT results are available. Return to the ED for any worsening symptoms. Take the prescription pain medicine as needed. See your provider for suture removal in 5-7 days.

## 2023-11-17 ENCOUNTER — Telehealth: Payer: Self-pay | Admitting: Emergency Medicine

## 2023-11-17 NOTE — Telephone Encounter (Signed)
 Called patient at request of Ronnie Doss PA due to possible avulsion fracture of elbow.  No answer and no voicemail.

## 2023-11-18 NOTE — Telephone Encounter (Addendum)
 Tried to contact Alexander Avery again and no answer and no VM.  Will send letter.  When contacted pateint should be referred to follow up with [facility] [phone].

## 2023-11-19 ENCOUNTER — Telehealth: Payer: Self-pay | Admitting: Physician Assistant
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
## 2023-11-19 NOTE — Telephone Encounter (Cosign Needed)
 Left a voicemail message at the patient's sister's phone number, listed as his alternate contact; Geraldine Daye at the phone number listed for her.  I relayed that we had attempted to contact the patient via telephone when were unsuccessful.  Information to the patient is that he is being referred to orthopedic specialist for his elbow injury.  Patient left the ED on the date of service prior to results being available for his CT and plain film x-rays.  He now needs referral information for orthopedics.  When contacted pateint should be referred to follow up with [facility] [phone].

## 2023-11-21 ENCOUNTER — Ambulatory Visit (INDEPENDENT_AMBULATORY_CARE_PROVIDER_SITE_OTHER): Payer: 59 | Admitting: Internal Medicine

## 2023-11-21 VITALS — BP 122/82 | HR 70 | Ht 64.0 in | Wt 116.0 lb

## 2023-11-21 DIAGNOSIS — C61 Malignant neoplasm of prostate: Secondary | ICD-10-CM

## 2023-11-21 DIAGNOSIS — I10 Essential (primary) hypertension: Secondary | ICD-10-CM | POA: Diagnosis not present

## 2023-11-21 DIAGNOSIS — R5381 Other malaise: Secondary | ICD-10-CM

## 2023-11-21 DIAGNOSIS — D6489 Other specified anemias: Secondary | ICD-10-CM | POA: Diagnosis not present

## 2023-11-21 NOTE — Progress Notes (Signed)
 Established Patient Office Visit  Subjective:  Patient ID: Alexander Avery, male    DOB: 22-May-1956  Age: 68 y.o. MRN: [id number]  No chief complaint on file.   Recurrent falls with facial laceration requiring sutures at the ED. Wants to be admitted to skilled nursing.    No other concerns at this time.   Past Medical History:  Diagnosis Date   Anxiety    Arthritis    Cancer (HCC)    prostate   Cataract 2019   Diabetes mellitus without complication (HCC)    Diastolic CHF, chronic (HCC)    GERD (gastroesophageal reflux disease)    Hernia of abdominal wall 2015   History of stomach ulcers    Hypertension    Prostate cancer (HCC)    Seizures (HCC)    Sleep apnea     Past Surgical History:  Procedure Laterality Date   CATARACT EXTRACTION W/PHACO Right 03/30/2018   Procedure: CATARACT EXTRACTION PHACO AND INTRAOCULAR LENS PLACEMENT (IOC);  Surgeon: Myrna Adine Anes, MD;  Location: [facility] ORS;  Service: Ophthalmology;  Laterality: Right;  US  01:09.5 AP% 10.4 CDE 7.98 Fluid pack lot # [id number]   CATARACT EXTRACTION W/PHACO Left 06/29/2018   Procedure: CATARACT EXTRACTION PHACO AND INTRAOCULAR LENS PLACEMENT (IOC);  Surgeon: Myrna Adine Anes, MD;  Location: [facility] ORS;  Service: Ophthalmology;  Laterality: Left;  US  01:27.7 AP% 8.2 CDE 7.27 Fluid pack lot # [id number]   EYE SURGERY     HERNIA REPAIR     PROSTATE SURGERY     PROSTATE SURGERY  2016    Social History   Socioeconomic History   Marital status: Single    Spouse name: Not on file   Number of children: Not on file   Years of education: Not on file   Highest education level: Not on file  Occupational History   Not on file  Tobacco Use   Smoking status: Every Day    Current packs/day: 2.00    Types: Cigarettes    Passive exposure: Current   Smokeless tobacco: Never  Vaping Use   Vaping status: Never Used  Substance and Sexual Activity   Alcohol use: Yes    Alcohol/week: 2.0 standard drinks of  alcohol    Types: 2 Cans of beer per week    Comment: 2 beers twice a week   Drug use: Yes    Types: Marijuana   Sexual activity: Not Currently  Other Topics Concern   Not on file  Social History Narrative   Not on file   Social Drivers of Health   Financial Resource Strain: Not on file  Food Insecurity: Not on file  Transportation Needs: Not on file  Physical Activity: Not on file  Stress: Not on file  Social Connections: Not on file  Intimate Partner Violence: Not on file    Family History  Problem Relation Age of Onset   Cancer Mother     Allergies  Allergen Reactions   Penicillins Hives and Other (See Comments)    Urinate on self, pass out Has patient had a PCN reaction causing immediate rash, facial/tongue/throat swelling, SOB or lightheadedness with hypotension: Yes Has patient had a PCN reaction causing severe rash involving mucus membranes or skin necrosis: No Has patient had a PCN reaction that required hospitalization: Yes- In hospital Has patient had a PCN reaction occurring within the last 10 years: Yes If all of the above answers are NO, then may proceed with Cephalosporin use.  Other reaction(Mckynleigh Mussell):  Other (See Comments) Urinate on self, pass out Has patient had a PCN reaction causing immediate rash, facial/tongue/throat swelling, SOB or lightheadedness with hypotension: Yes Has patient had a PCN reaction causing severe rash involving mucus membranes or skin necrosis: No Has patient had a PCN reaction that required hospitalization: Yes- In hospital Has patient had a PCN reaction occurring within the last 10 years: Yes If all of the above answers are NO, then may proceed with Cephalosporin use. I pass out     Outpatient Medications Prior to Visit  Medication Sig   amLODipine  (NORVASC ) 5 MG tablet TAKE 1 TABLET BY MOUTH IN THE MORNING   SUBOXONE  8-2 MG FILM Place under the tongue 2 (two) times daily.   acetaminophen  (TYLENOL ) 500 MG tablet Take 1-2  tabs as needed every 6 hrs for pain (Patient not taking: Reported on 11/21/2023)   furosemide  (LASIX ) 40 MG tablet Take to 1 tablet (40 mg total) by mouth ONCE OR TWICE daily (total daily dose maximum 80 mg) as needed for up to 3 days for increased leg swelling, shortness of breath, weight gain 5+ lbs over 1-2 days. Seek medical care if these symptoms are not improving with increased dose. (Patient not taking: Reported on 11/21/2023)   ibuprofen  (ADVIL ) 800 MG tablet Take 1 tablet (800 mg total) by mouth every 6 (six) hours as needed. (Patient not taking: Reported on 11/21/2023)   Vibegron  (GEMTESA ) 75 MG TABS Take 75 mg by mouth daily. (Patient not taking: Reported on 11/21/2023)   No facility-administered medications prior to visit.    Review of Systems  Constitutional: Negative.   HENT: Negative.    Eyes: Negative.   Respiratory: Negative.    Cardiovascular: Negative.   Gastrointestinal:  Positive for blood in stool. Negative for constipation.  Genitourinary: Negative.   Musculoskeletal:        As in hpi  Skin: Negative.   Neurological: Negative.   Endo/Heme/Allergies: Negative.        Objective:   BP 122/82   Pulse 70   Ht 5' 4 (1.626 m)   Wt 116 lb (52.6 kg)   SpO2 95%   BMI 19.91 kg/m   Vitals:   11/21/23 1502  BP: 122/82  Pulse: 70  Height: 5' 4 (1.626 m)  Weight: 116 lb (52.6 kg)  SpO2: 95%  BMI (Calculated): 19.9    Physical Exam Vitals reviewed.  Constitutional:      Appearance: Normal appearance.  HENT:     Head: Normocephalic.     Left Ear: There is no impacted cerumen.     Nose: Nose normal.     Mouth/Throat:     Mouth: Mucous membranes are moist.     Pharynx: No posterior oropharyngeal erythema.  Eyes:     Extraocular Movements: Extraocular movements intact.     Pupils: Pupils are equal, round, and reactive to light.  Cardiovascular:     Rate and Rhythm: Regular rhythm.     Chest Wall: PMI is not displaced.     Pulses: Normal pulses.     Heart  sounds: Normal heart sounds. No murmur heard. Pulmonary:     Effort: Pulmonary effort is normal.     Breath sounds: Normal air entry. No rhonchi or rales.  Abdominal:     General: Abdomen is flat. Bowel sounds are normal. There is no distension.     Palpations: Abdomen is soft. There is no hepatomegaly, splenomegaly or mass.     Tenderness: There is no abdominal  tenderness.  Musculoskeletal:        General: Normal range of motion.     Cervical back: Normal range of motion and neck supple.     Right lower leg: No edema.     Left lower leg: No edema.  Skin:    General: Skin is warm and dry.  Neurological:     General: No focal deficit present.     Mental Status: He is alert and oriented to person, place, and time.     Cranial Nerves: No cranial nerve deficit.     Motor: No weakness.  Psychiatric:        Mood and Affect: Mood normal.        Behavior: Behavior normal.      No results found for any visits on 11/21/23.      Assessment & Plan:  Unable to take care of himself at home, advised to go to the ER in order to be placed in a SNF. Problem List Items Addressed This Visit       Cardiovascular and Mediastinum   Primary hypertension     Genitourinary   Prostate cancer [facility])     Other   Physical debility - Primary   Other specified anemias    Return in about 3 months (around 02/19/2024).   Total time spent: 20 minutes  Sherrill Cinderella Perry, MD  11/21/2023   This document may have been prepared by [facility] Voice Recognition software and as such may include unintentional dictation errors.

## 2024-01-03 ENCOUNTER — Emergency Department
Admission: EM | Admit: 2024-01-03 | Discharge: 2024-01-17 | Disposition: A | Discharge status: Home / Self Care | Payer: 59 | Attending: Student in an Organized Health Care Education/Training Program | Admitting: Student in an Organized Health Care Education/Training Program

## 2024-01-03 ENCOUNTER — Emergency Department: Payer: 59

## 2024-01-03 ENCOUNTER — Other Ambulatory Visit: Payer: Self-pay

## 2024-01-03 DIAGNOSIS — R2689 Other abnormalities of gait and mobility: Secondary | ICD-10-CM | POA: Insufficient documentation

## 2024-01-03 DIAGNOSIS — S0990XA Unspecified injury of head, initial encounter: Secondary | ICD-10-CM | POA: Insufficient documentation

## 2024-01-03 DIAGNOSIS — Z5900 Homelessness unspecified: Secondary | ICD-10-CM | POA: Insufficient documentation

## 2024-01-03 DIAGNOSIS — R296 Repeated falls: Secondary | ICD-10-CM | POA: Insufficient documentation

## 2024-01-03 DIAGNOSIS — I6782 Cerebral ischemia: Secondary | ICD-10-CM | POA: Insufficient documentation

## 2024-01-03 DIAGNOSIS — W19XXXA Unspecified fall, initial encounter: Secondary | ICD-10-CM | POA: Diagnosis not present

## 2024-01-03 DIAGNOSIS — I10 Essential (primary) hypertension: Secondary | ICD-10-CM

## 2024-01-03 LAB — URINALYSIS, ROUTINE W REFLEX MICROSCOPIC
Bacteria, UA: NONE SEEN
Bilirubin Urine: NEGATIVE
Glucose, UA: NEGATIVE mg/dL
Hgb urine dipstick: NEGATIVE
Ketones, ur: NEGATIVE mg/dL
Leukocytes,Ua: NEGATIVE
Nitrite: NEGATIVE
Protein, ur: 100 mg/dL — AB
Specific Gravity, Urine: 1.019 (ref 1.005–1.030)
Squamous Epithelial / HPF: 0 /[HPF] (ref 0–5)
pH: 5 (ref 5.0–8.0)

## 2024-01-03 LAB — CBC WITH DIFFERENTIAL/PLATELET
Abs Immature Granulocytes: 0.01 10*3/uL (ref 0.00–0.07)
Basophils Absolute: 0 10*3/uL (ref 0.0–0.1)
Basophils Relative: 1 %
Eosinophils Absolute: 0.2 10*3/uL (ref 0.0–0.5)
Eosinophils Relative: 4 %
HCT: 26.2 % — ABNORMAL LOW (ref 39.0–52.0)
Hemoglobin: 9.3 g/dL — ABNORMAL LOW (ref 13.0–17.0)
Immature Granulocytes: 0 %
Lymphocytes Relative: 19 %
Lymphs Abs: 1.1 10*3/uL (ref 0.7–4.0)
MCH: 28.3 pg (ref 26.0–34.0)
MCHC: 35.5 g/dL (ref 30.0–36.0)
MCV: 79.6 fL — ABNORMAL LOW (ref 80.0–100.0)
Monocytes Absolute: 0.8 10*3/uL (ref 0.1–1.0)
Monocytes Relative: 15 %
Neutro Abs: 3.4 10*3/uL (ref 1.7–7.7)
Neutrophils Relative %: 61 %
Platelets: 174 10*3/uL (ref 150–400)
RBC: 3.29 MIL/uL — ABNORMAL LOW (ref 4.22–5.81)
RDW: 14.2 % (ref 11.5–15.5)
WBC: 5.6 10*3/uL (ref 4.0–10.5)
nRBC: 0 % (ref 0.0–0.2)

## 2024-01-03 LAB — COMPREHENSIVE METABOLIC PANEL
ALT: 9 U/L (ref 0–44)
AST: 14 U/L — ABNORMAL LOW (ref 15–41)
Albumin: 3 g/dL — ABNORMAL LOW (ref 3.5–5.0)
Alkaline Phosphatase: 69 U/L (ref 38–126)
Anion gap: 12 (ref 5–15)
BUN: 30 mg/dL — ABNORMAL HIGH (ref 8–23)
CO2: 25 mmol/L (ref 22–32)
Calcium: 8.7 mg/dL — ABNORMAL LOW (ref 8.9–10.3)
Chloride: 102 mmol/L (ref 98–111)
Creatinine, Ser: 1.81 mg/dL — ABNORMAL HIGH (ref 0.61–1.24)
GFR, Estimated: 40 mL/min — ABNORMAL LOW (ref >60)
Glucose, Bld: 98 mg/dL (ref 70–99)
Potassium: 3.9 mmol/L (ref 3.5–5.1)
Sodium: 139 mmol/L (ref 135–145)
Total Bilirubin: 0.5 mg/dL (ref 0.0–1.2)
Total Protein: 6.8 g/dL (ref 6.5–8.1)

## 2024-01-03 MED ORDER — AMLODIPINE BESYLATE 5 MG PO TABS
5.0000 mg | ORAL_TABLET | Freq: Every morning | ORAL | Status: DC
  Administered 2024-01-04 – 2024-01-17 (×14): 5 mg via ORAL
  Filled 2024-01-03 (×14): qty 1

## 2024-01-03 MED ORDER — BUPRENORPHINE HCL-NALOXONE HCL 8-2 MG SL SUBL
1.0000 | SUBLINGUAL_TABLET | Freq: Two times a day (BID) | SUBLINGUAL | Status: DC
  Administered 2024-01-03 – 2024-01-17 (×28): 1 via SUBLINGUAL
  Filled 2024-01-03 (×29): qty 1

## 2024-01-03 NOTE — ED Provider Notes (Signed)
 [facility] Provider Note    Event Date/Time   First MD Initiated Contact with Patient 01/03/24 1817     (approximate)   History   Fall   HPI  EMERSON SCHREIFELS is a 68 y.o. male who presents to the ER for homelessness.  He was brought in by DSS because he is reportedly evicted today.  States he does need a prescription for Suboxone and that everything is hurting denies any other complaints.  States that he tried at home with shelters but they were all full.  Nuys any other complaints.     Physical Exam   Triage Vital Signs: ED Triage Vitals  Encounter Vitals Group     BP 01/03/24 1453 (!) 158/73     Systolic BP Percentile --      Diastolic BP Percentile --      Pulse Rate 01/03/24 1453 64     Resp 01/03/24 1453 20     Temp 01/03/24 1453 98.4 F (36.9 C)     Temp Source 01/03/24 1453 Oral     SpO2 01/03/24 1453 99 %     Weight 01/03/24 1452 125 lb (56.7 kg)     Height 01/03/24 1452 5\' 6"  (1.676 m)     Head Circumference --      Peak Flow --      Pain Score 01/03/24 1454 9     Pain Loc --      Pain Education --      Exclude from Growth Chart --     Most recent vital signs: Vitals:   01/03/24 1453  BP: (!) 158/73  Pulse: 64  Resp: 20  Temp: 98.4 F (36.9 C)  SpO2: 99%     Constitutional: Alert  Eyes: Conjunctivae are normal.  Head: Atraumatic. Nose: No congestion/rhinnorhea. Mouth/Throat: Mucous membranes are moist.   Neck: Painless ROM.  Cardiovascular:   Good peripheral circulation. Respiratory: Normal respiratory effort.  No retractions.  Gastrointestinal: Soft and nontender.  Musculoskeletal:  no deformity Neurologic:  MAE spontaneously. No gross focal neurologic deficits are appreciated.  Skin:  Skin is warm, dry and intact. No rash noted. Psychiatric: Mood and affect are normal. Speech and behavior are normal.    ED Results / Procedures / Treatments   Labs (all labs ordered are listed, but only abnormal results  are displayed) Labs Reviewed  CBC WITH DIFFERENTIAL/PLATELET  COMPREHENSIVE METABOLIC PANEL  URINALYSIS, ROUTINE W REFLEX MICROSCOPIC     EKG     RADIOLOGY Please see ED Course for my review and interpretation.  I personally reviewed all radiographic images ordered to evaluate for the above acute complaints and reviewed radiology reports and findings.  These findings were personally discussed with the patient.  Please see medical record for radiology report.    PROCEDURES:  Critical Care performed:   Procedures   MEDICATIONS ORDERED IN ED: Medications - No data to display   IMPRESSION / MDM / ASSESSMENT AND PLAN / ED COURSE  I reviewed the triage vital signs and the nursing notes.                              Differential diagnosis includes, but is not limited to, homelessness, electrolyte abn, fracture, sdh, iph, cva, sepsis  Patient presenting to the ER for evaluation of symptoms as described above.  Based on symptoms, risk factors and considered above differential, this presenting complaint could reflect a potentially  life-threatening illness therefore the patient will be placed on continuous pulse oximetry and telemetry for monitoring.  Laboratory evaluation will be sent to evaluate for the above complaints.  Will check screening labs.  X-ray imaging on my review and interpretation without evidence of acute abnormality.  Do suspect large component of this is secondary to homelessness and has reportedly DSS dropped the patient off for placement will consult TOC.   Clinical Course as of 01/03/24 2150  Tue Jan 03, 2024  2149 Workup otherwise reassuring.  Patient sleeping in the hallway.  Do not see indication for medical admission at this time.  Will consult TOC placed in for status. [PR]    Clinical Course User Index [PR] Willy Eddy, MD     FINAL CLINICAL IMPRESSION(S) / ED DIAGNOSES   Final diagnoses:  Frequent falls  Homelessness     Rx / DC Orders    ED Discharge Orders     None        Note:  This document was prepared using Dragon voice recognition software and may include unintentional dictation errors.    Willy Eddy, MD 01/03/24 [phone]

## 2024-01-03 NOTE — ED Triage Notes (Signed)
 Pt to ED stating he was "dropped off by DSS" and lives at [facility] and that DSS told him he needed to come to ED to be placed in nursing home. Pt states he has fallen "8,9, maybe 12" times in the last month. Pt is ambulatory with own walker. States fell yesterday and hit L arm and both hips.

## 2024-01-04 DIAGNOSIS — S0990XA Unspecified injury of head, initial encounter: Secondary | ICD-10-CM | POA: Diagnosis not present

## 2024-01-04 LAB — CBC WITH DIFFERENTIAL/PLATELET
Abs Immature Granulocytes: 0.01 10*3/uL (ref 0.00–0.07)
Basophils Absolute: 0 10*3/uL (ref 0.0–0.1)
Basophils Relative: 1 %
Eosinophils Absolute: 0.3 10*3/uL (ref 0.0–0.5)
Eosinophils Relative: 5 %
HCT: 28.9 % — ABNORMAL LOW (ref 39.0–52.0)
Hemoglobin: 10 g/dL — ABNORMAL LOW (ref 13.0–17.0)
Immature Granulocytes: 0 %
Lymphocytes Relative: 16 %
Lymphs Abs: 0.9 10*3/uL (ref 0.7–4.0)
MCH: 27.9 pg (ref 26.0–34.0)
MCHC: 34.6 g/dL (ref 30.0–36.0)
MCV: 80.7 fL (ref 80.0–100.0)
Monocytes Absolute: 0.7 10*3/uL (ref 0.1–1.0)
Monocytes Relative: 12 %
Neutro Abs: 3.9 10*3/uL (ref 1.7–7.7)
Neutrophils Relative %: 66 %
Platelets: 191 10*3/uL (ref 150–400)
RBC: 3.58 MIL/uL — ABNORMAL LOW (ref 4.22–5.81)
RDW: 14.3 % (ref 11.5–15.5)
WBC: 5.8 10*3/uL (ref 4.0–10.5)
nRBC: 0 % (ref 0.0–0.2)

## 2024-01-04 LAB — COMPREHENSIVE METABOLIC PANEL
ALT: 8 U/L (ref 0–44)
AST: 9 U/L — ABNORMAL LOW (ref 15–41)
Albumin: 2.9 g/dL — ABNORMAL LOW (ref 3.5–5.0)
Alkaline Phosphatase: 69 U/L (ref 38–126)
Anion gap: 8 (ref 5–15)
BUN: 26 mg/dL — ABNORMAL HIGH (ref 8–23)
CO2: 26 mmol/L (ref 22–32)
Calcium: 8.6 mg/dL — ABNORMAL LOW (ref 8.9–10.3)
Chloride: 103 mmol/L (ref 98–111)
Creatinine, Ser: 1.82 mg/dL — ABNORMAL HIGH (ref 0.61–1.24)
GFR, Estimated: 40 mL/min — ABNORMAL LOW (ref >60)
Glucose, Bld: 124 mg/dL — ABNORMAL HIGH (ref 70–99)
Potassium: 4 mmol/L (ref 3.5–5.1)
Sodium: 137 mmol/L (ref 135–145)
Total Bilirubin: 0.6 mg/dL (ref 0.0–1.2)
Total Protein: 6.8 g/dL (ref 6.5–8.1)

## 2024-01-04 LAB — LIPASE, BLOOD: Lipase: 31 U/L (ref 11–51)

## 2024-01-04 MED ORDER — ALUM & MAG HYDROXIDE-SIMETH 200-200-20 MG/5ML PO SUSP
30.0000 mL | ORAL | Status: DC | PRN
  Administered 2024-01-04 – 2024-01-17 (×7): 30 mL via ORAL
  Filled 2024-01-04 (×8): qty 30

## 2024-01-04 MED ORDER — ACETAMINOPHEN 325 MG PO TABS
650.0000 mg | ORAL_TABLET | Freq: Once | ORAL | Status: AC
  Administered 2024-01-04: 650 mg via ORAL
  Filled 2024-01-04: qty 2

## 2024-01-04 MED ORDER — ALUM & MAG HYDROXIDE-SIMETH 200-200-20 MG/5ML PO SUSP
30.0000 mL | Freq: Once | ORAL | Status: AC
  Administered 2024-01-04: 30 mL via ORAL
  Filled 2024-01-04: qty 30

## 2024-01-04 NOTE — Evaluation (Addendum)
 Physical Therapy Evaluation Patient Details Name: DEMARQUES PILZ MRN: [id number] DOB: 01/16/1956 Today's Date: 01/04/2024  History of Present Illness  Alexander Avery is a 68 y.o. male who presents to the ER for homelessness.  He was brought in by DSS because he is reportedly evicted today.  He fell the day before coming to the ED and reports 8-12 falls in the last month. He was admitted with diagnoses of frequent falls and homelessness.   Clinical Impression  Patient received reclining in bed and agreeable to PT evaluation. Patient is alert and oriented and able to provide detailed history. He was recently evicted and is currently homeless. The possessions he has at the hospital are all that are currently available to him and include a rollator. Prior to hospitalization, he lived alone on the 3rd floor of a buidling with an elevated. When the elevator broke he was unable to get up the stairs, and EMS carried him up the stairs. He states he needed help with ADLs and IADLs but did not have help except someone who occasionally helped clean his home or helped him get groceries. He ambulated with rollator or walls/furniture for support in the home and used a scooter in the community (but it broke so he can no longer use it). He reports 80 falls in the last 6 months, with several recent including one that required him to get stitches above his right eye which he has not had removed yet. Upon PT evaluation, patient was mod I for bed mobility, and needed supervision for transfers and to walk up to 240 feet at a time with a rollator and supervision. He demonstrated a shuffling gait and did walk outside of his rollator during turns and left his rollator behind when using the bathroom which puts him at greater risk for falls. No stairs were available for testing, but patient was unable to complete 5 Times Sit To Stand Test without leaning heavily on the bed with the back of his legs and it took him 20 seconds to  complete the test which suggests he has an elevated fall risk. Given his report of being unable to climb stairs and his performance on the 5 Times Sit To Stand Test, he is unlikely to be able to complete stairs without assistance. He would benefit from continued PT support to improve his strength, functional mobility, and balance including when he discharges from the acute care setting. Given his history of many falls with injury, he requires supervision/assistance with all ambulatory activities at this point. Patient would benefit from skilled physical therapy to address impairments and functional limitations (see PT Problem List below) to work towards stated goals and return to PLOF or maximal functional independence.        If plan is discharge home, recommend the following: A little help with walking and/or transfers;Assistance with cooking/housework;Assist for transportation;Help with stairs or ramp for entrance;A little help with bathing/dressing/bathroom   Can travel by private vehicle   Yes    Equipment Recommendations None recommended by PT  Recommendations for Other Services       Functional Status Assessment Patient has had a recent decline in their functional status and demonstrates the ability to make significant improvements in function in a reasonable and predictable amount of time.     Precautions / Restrictions Precautions Precautions: Fall      Mobility  Bed Mobility Overal bed mobility: Modified Independent             General  bed mobility comments: supine to sit from flat surface with increased time but no need for UE support on handrails.    Transfers Overall transfer level: Needs assistance Equipment used: Rollator (4 wheels) Transfers: Sit to/from Stand Sit to Stand: Supervision           General transfer comment: Patient completed sit <> stand from hospital bed with B UE support to RW. He also completed 5 Time Sit To Stand Test in approx 20  seconds with no UE support but fully leaning against the bed with the backs of his legs.    Ambulation/Gait Ambulation/Gait assistance: Supervision Gait Distance (Feet): 240 Feet (240+150) Assistive device: Rollator (4 wheels) Gait Pattern/deviations: Step-through pattern, Shuffle, Decreased step length - left, Decreased step length - right, Trunk flexed       General Gait Details: Patient ambulated with SBA using rollator. He left his walker behind when using the bathroom and held onto the sink and walker from the wrong end to get from the toilet to the sink. He also stepped outside of his walker when turning 180 degrees, but remembered to keep his body inside of the walker after education and cuing to do so. He fatigued after 240 feet and correctly locked the rollator and used it for a seated break without LOB or cuing from PT. He ambulated with a shuffling gait and stooped posture, and UE support became heavier as he grew tired. He was able to walk with high knees for several feet when cued to do so.  Stairs            Wheelchair Mobility     Tilt Bed    Modified Rankin (Stroke Patients Only)       Balance Overall balance assessment: Needs assistance Sitting-balance support: No upper extremity supported, Feet supported Sitting balance-Leahy Scale: Normal Sitting balance - Comments: Patient with steady sitting at edge of bed. He was able to bend over and untie his shoes and attempt to put them on for several minutes without LOB before PT assisted with getting his heels into the shoes.   Standing balance support: No upper extremity supported, Bilateral upper extremity supported, Reliant on assistive device for balance, During functional activity Standing balance-Leahy Scale: Fair Standing balance comment: Patient was reliant on B UE support when taking steps but was able to complete standing pericare without UE support.               High Level Balance Comments: 5 Time  Sit To Stand Test: 20 seconds from bed with no UE support but fully leaning on bed with backs of legs with inadequate forward shift.             Pertinent Vitals/Pain Pain Assessment Pain Assessment: No/denies pain    Home Living Family/patient expects to be discharged to:: Shelter/Homeless                   Additional Comments: Patient is currenty homeless. He states he previously lived alone on the 3rd floor in a building where used the elevator. When it broke EMS had to be called to carry him up the steps because he could not navigate them. He used a walk in shower with a seat. He currently only has his belongings at the hospital which includes a rollator. The rest of his belongings are locked when he was evicted. He states there is no one available to assist him upon discharge.    Prior Function Prior Level of Function :  Needs assist;History of Falls (last six months)             Mobility Comments: Patient reports he ambulated in the home using his rollator or while holding onto walls and furnature. He reports 80 falls in the last 6 months, mostly when he is turning or when his legs give out. He used to have [place] scooter that he used for community mobility, but that broke recently. ADLs Comments: Patient states he required help for ADLs and IADLs, but also stated he did not have anyone to help him with ADLs. He had someone who helped him clean the house and get groceries sometimes. He does not drive.     Extremity/Trunk Assessment   Upper Extremity Assessment Upper Extremity Assessment: Generalized weakness    Lower Extremity Assessment Lower Extremity Assessment: Generalized weakness    Cervical / Trunk Assessment Cervical / Trunk Assessment: Normal  Communication   Communication Communication: No apparent difficulties    Cognition Arousal: Alert Behavior During Therapy: WFL for tasks assessed/performed                             Following  commands: Intact       Cueing Cueing Techniques: Verbal cues     General Comments General comments (skin integrity, edema, etc.): SpO2 and HR WFL throughout session.    Exercises Other Exercises Other Exercises: patient educated on role of PT in acute care setting, safe mobility techniques.   Assessment/Plan    PT Assessment Patient needs continued PT services  PT Problem List Decreased strength;Decreased balance;Decreased mobility;Decreased knowledge of use of DME;Decreased activity tolerance       PT Treatment Interventions DME instruction;Therapeutic activities;Gait training;Therapeutic exercise;Patient/family education;Stair training;Balance training;Functional mobility training;Neuromuscular re-education    PT Goals (Current goals can be found in the Care Plan section)  Acute Rehab PT Goals Patient Stated Goal: to get a room with a TV so he can watch the news and get his stitches out PT Goal Formulation: With patient Time For Goal Achievement: 01/18/24 Potential to Achieve Goals: Fair    Frequency Min 1X/week     Co-evaluation               AM-PAC PT "6 Clicks" Mobility  Outcome Measure Help needed turning from your back to your side while in a flat bed without using bedrails?: None Help needed moving from lying on your back to sitting on the side of a flat bed without using bedrails?: None Help needed moving to and from a bed to a chair (including a wheelchair)?: None Help needed standing up from a chair using your arms (e.g., wheelchair or bedside chair)?: None Help needed to walk in hospital room?: A Little Help needed climbing 3-5 steps with a railing? : Total 6 Click Score: 20    End of Session   Activity Tolerance: Patient tolerated treatment well;No increased pain;Patient limited by fatigue Patient left: in bed;with nursing/sitter in room (in hallway in front of nursing station) Nurse Communication: Mobility status PT Visit Diagnosis: History of  falling (Z91.81);Muscle weakness (generalized) (M62.81);Other abnormalities of gait and mobility (R26.89)    Time: 4696-2952 PT Time Calculation (min) (ACUTE ONLY): 24 min   Charges:   PT Evaluation $PT Eval Low Complexity: 1 Low PT Treatments $Gait Training: 8-22 mins PT General Charges $$ ACUTE PT VISIT: 1 Visit       Huntley Dec R. Ilsa Iha, PT, DPT 01/04/24, 2:52 PM

## 2024-01-04 NOTE — ED Provider Notes (Signed)
 Emergency Medicine Observation Re-evaluation Note  Alexander Avery is a 68 y.o. male, seen on rounds today.  Pt initially presented to the ED for complaints of Fall  Currently, the patient is sitting up in bed eating breakfast.  Notified by RN that patient was complaining of abdominal pain.  Patient reports pain is in his upper abdomen, denies chest pain, shortness of breath.  Physical Exam  BP (!) 171/75 (BP Location: Left Arm)   Pulse 66   Temp 98.9 F (37.2 C) (Oral)   Resp 14   Ht 5\' 6"  (1.676 m)   Wt 56.7 kg   SpO2 96%   BMI 20.18 kg/m  Physical Exam General: Sitting upright in bed eating breakfast, near empty plate with bacon, grits, powdered donuts on table Resp: Unlabored Abdomen: Reproducible abdominal tenderness around the epigastric region, remainder of abdomen nontender  ED Course / MDM   Labs from yesterday reviewed, no critical derangements.  With new onset abdominal pain, will send repeat labs, do not feel there is an indication for imaging.  Will treat symptomatically with GI cocktail.  Plan  Current plan is for dispo per social work, suspect patient will remain medically cleared following labs.  10:53 AM Labs overall reassuring.  Patient reassessed, reports resolution of his abdominal pain.  Continue plans for TOC placement.    Trinna Post, MD 01/04/24 1053

## 2024-01-04 NOTE — ED Provider Notes (Signed)
-----------------------------------------   5:38 AM on 01/04/2024 -----------------------------------------   Blood pressure (!) 171/75, pulse 66, temperature 98.9 F (37.2 C), temperature source Oral, resp. rate 14, height 5\' 6"  (1.676 m), weight 56.7 kg, SpO2 96%.  The patient is calm and cooperative at this time.  There have been no acute events since the last update.  Awaiting disposition plan from Social Work team.   Irean Hong, MD 01/04/24 [phone]

## 2024-01-04 NOTE — TOC Progression Note (Addendum)
 Transition of Care [facility]) - Progression Note    Patient Details  Name: Alexander Avery MRN: [id number] Date of Birth: 11-08-1956  [facility]) CM/SW Contact  [name], [place] Phone Number: 01/04/2024, 11:29 AM  Clinical Narrative:     [facility] consult for SNF placement. PT eval pending.  TOC will follow for PT recs.   CSW left voicemail with individual listed as pt's legal guardian, Cristi Loron [phone] requesting return call.                        Social Determinants of Health (SDOH) Interventions SDOH Screenings   Alcohol Screen: High Risk (11/26/2021)  Tobacco Use: High Risk (01/03/2024)    Readmission Risk Interventions     No data to display

## 2024-01-04 NOTE — ED Notes (Signed)
 Pharm in pxysis. Delay in meds at this time.

## 2024-01-05 DIAGNOSIS — S0990XA Unspecified injury of head, initial encounter: Secondary | ICD-10-CM | POA: Diagnosis not present

## 2024-01-05 MED ORDER — POLYETHYLENE GLYCOL 3350 17 G PO PACK
17.0000 g | PACK | Freq: Every day | ORAL | Status: DC
  Administered 2024-01-05 – 2024-01-17 (×12): 17 g via ORAL
  Filled 2024-01-05 (×12): qty 1

## 2024-01-05 NOTE — ED Notes (Signed)
 Coffee provided by this RN per request

## 2024-01-05 NOTE — ED Notes (Signed)
 This tech did a complete linen change on pts bed, placed down chuck pads, and provided pt with new blankets.

## 2024-01-05 NOTE — ED Provider Notes (Signed)
 Emergency Medicine Observation Re-evaluation Note  Physical Exam   BP (!) 165/69   Pulse 76   Temp 98.9 F (37.2 C) (Oral)   Resp 18   Ht 5\' 6"  (1.676 m)   Wt 56.7 kg   SpO2 97%   BMI 20.18 kg/m   Patient appears in no acute distress.  ED Course / MDM   No reported events during my shift at the time of this note.   Pt is awaiting dispo from consultants   Pilar Jarvis MD    Pilar Jarvis, MD 01/05/24 [phone]

## 2024-01-05 NOTE — Progress Notes (Signed)
 Pt completed gait training with personal Rollator around ED unit without difficulty. Shuffling steps self corrected with cues. Pt very pleasant and cooperative. He states he is waiting to transfer to STR. Will continue PT per acute POC. Mobility Tech on board for mobilizing pt during the day as well.    01/05/24 1700  PT Visit Information  Assistance Needed +1  History of Present Illness Alexander Avery is a 68 y.o. male who presents to the ER for homelessness.  He was brought in by DSS because he is reportedly evicted today.  He fell the day before coming to the ED and reports 8-12 falls in the last month. He was admitted with diagnoses of frequent falls and homelessness.  Subjective Data  Patient Stated Goal to get a room with a TV so he can watch the news and get his stitches out  Precautions  Precautions Fall  Restrictions  Weight Bearing Restrictions Per Provider Order No  Pain Assessment  Pain Assessment No/denies pain  Cognition  Arousal Alert  Behavior During Therapy [facility] for tasks assessed/performed  PT - Cognitive impairments No family/caregiver present to determine baseline  PT - Cognition Comments  (Pt pleasant and cooperative)  Following Commands  Following commands Intact  Cueing  Cueing Techniques Verbal cues;Visual cues  Communication  Communication No apparent difficulties  Bed Mobility  Overal bed mobility Modified Independent  General bed mobility comments supine to sit from flat surface with increased time but no need for UE support on handrails.  Transfers  Overall transfer level Needs assistance  Equipment used Rollator (4 wheels)  Transfers Sit to/from Stand  Sit to Stand Supervision  General transfer comment  (No functional difficulty noted with sit<>stand transfers)  Ambulation/Gait  Ambulation/Gait assistance Supervision  Gait Distance (Feet) 300 Feet  Assistive device Rollator (4 wheels)  Gait Pattern/deviations Step-through  pattern;Shuffle;Decreased step length - left;Decreased step length - right;Trunk flexed  General Gait Details Able to correct "shuffling" steps when cued, unable to maintain when distracted  Balance  Overall balance assessment Needs assistance  Sitting-balance support No upper extremity supported;Feet supported  Sitting balance-Leahy Scale Normal  Standing balance support Bilateral upper extremity supported;During functional activity;Reliant on assistive device for balance  Standing balance-Leahy Scale Fair  Standing balance comment Pt able to stand without UE support statically, Requires his personal Rollator for dynamic tasks  General Comments  General comments (skin integrity, edema, etc.) Pt pleasant and cooperativem understands role of PT.  PT - End of Session  Activity Tolerance Patient tolerated treatment well;No increased pain;Patient limited by fatigue  Patient left in bed;with nursing/sitter in room (Sitting EOB on stretcher in hallway)  Nurse Communication Mobility status   PT - Assessment/Plan  PT Visit Diagnosis History of falling (Z91.81);Muscle weakness (generalized) (M62.81);Other abnormalities of gait and mobility (R26.89)  PT Frequency (ACUTE ONLY) Min 1X/week  Follow Up Recommendations Skilled nursing-short term rehab (<3 hours/day)  Can patient physically be transported by private vehicle Yes  Patient can return home with the following A little help with walking and/or transfers;Assistance with cooking/housework;Assist for transportation;Help with stairs or ramp for entrance;A little help with bathing/dressing/bathroom  PT equipment None recommended by PT  AM-PAC PT "6 Clicks" Mobility Outcome Measure (Version 2)  Help needed turning from your back to your side while in a flat bed without using bedrails? 4  Help needed moving from lying on your back to sitting on the side of a flat bed without using bedrails? 4  Help needed moving to  and from a bed to a chair (including  a wheelchair)? 4  Help needed standing up from a chair using your arms (e.g., wheelchair or bedside chair)? 4  Help needed to walk in hospital room? 3  Help needed climbing 3-5 steps with a railing?  1  6 Click Score 20  Consider Recommendation of Discharge To: Home with no services  Progressive Mobility  What is the highest level of mobility based on the progressive mobility assessment? Level 5 (Walks with assist in room/hall) - Balance while stepping forward/back and can walk in room with assist - Complete  Mobility Referral Yes  Activity Ambulated with assistance in hallway  PT Goal Progression  Progress towards PT goals Progressing toward goals  PT Time Calculation  PT Start Time (ACUTE ONLY) 1646  PT Stop Time (ACUTE ONLY) 1700  PT Time Calculation (min) (ACUTE ONLY) 14 min  PT General Charges  $$ ACUTE PT VISIT 1 Visit  PT Treatments  $Gait Training 8-22 mins   Zadie Cleverly, PTA 01/05/2024

## 2024-01-05 NOTE — NC FL2 (Addendum)
 [place] MEDICAID FL2 LEVEL OF CARE FORM     IDENTIFICATION  Patient Name: Alexander Avery Birthdate: 1956-08-20 Sex: male Admission Date (Current Location): 01/03/2024  [facility] and [place] Number:  Chiropodist and Address:  [facility], [address] [place], [place] [address]      Provider Number: [id number]  Attending Physician Name and Address:  No att. providers found  Relative Name and Phone Number:  [name] [phone]    Current Level of Care: Hospital Recommended Level of Care: Skilled Nursing Facility Prior Approval Number:    Date Approved/Denied:   PASRR Number: pending  Discharge Plan: SNF    Current Diagnoses: Patient Active Problem List   Diagnosis Date Noted   Other specified anemias 11/21/2023   Gastrointestinal hemorrhage 10/04/2023   Rib pain on right side 08/30/2023   Physical debility 06/03/2023   Bilateral leg edema 02/26/2023   Depression with anxiety 02/26/2023   Chronic diastolic CHF (congestive heart failure) (HCC) 02/26/2023   Protein-calorie malnutrition, moderate (HCC) 02/26/2023   Tobacco abuse 02/26/2023   Fall at home, initial encounter 02/26/2023   Near syncope 02/20/2023   Primary hypertension 02/20/2023   Leg swelling 02/20/2023   Cocaine abuse with cocaine-induced mood disorder (HCC) 04/18/2022   Opiate withdrawal (HCC) 11/28/2021   Severe episode of recurrent major depressive disorder, without psychotic features (HCC) 11/26/2021   Polysubstance (including opioids) dependence, daily use (HCC) 11/26/2021   Polysubstance abuse (HCC) 04/18/2020   Prostate cancer (HCC) 02/20/2019    Orientation RESPIRATION BLADDER Height & Weight     Self, Time, Place, Situation  Normal Continent Weight: 125 lb (56.7 kg) Height:  5\' 6"  (167.6 cm)  BEHAVIORAL SYMPTOMS/MOOD NEUROLOGICAL BOWEL NUTRITION STATUS      Continent Diet (See d/c summary)  AMBULATORY STATUS COMMUNICATION OF NEEDS Skin   Limited  Assist Verbally Normal                       Personal Care Assistance Level of Assistance  Bathing, Feeding, Dressing Bathing Assistance: Limited assistance Feeding assistance: Independent Dressing Assistance: Limited assistance     Functional Limitations Info  Speech, Hearing, Sight Sight Info: Adequate Hearing Info: Adequate Speech Info: Adequate    SPECIAL CARE FACTORS FREQUENCY  PT (By licensed PT), OT (By licensed OT)     PT Frequency: 5x/week OT Frequency: 5x/week            Contractures Contractures Info: Not present    Additional Factors Info  Code Status, Allergies Code Status Info: FUll code Allergies Info: Penicillins           Current Medications (01/05/2024):  This is the current hospital active medication list Current Facility-Administered Medications  Medication Dose Route Frequency Provider Last Rate Last Admin   alum & mag hydroxide-simeth (MAALOX/MYLANTA) 200-200-20 MG/5ML suspension 30 mL  30 mL Oral Q4H PRN Phineas Semen, MD   30 mL at 01/04/24 1621   amLODipine (NORVASC) tablet 5 mg  5 mg Oral q morning Willy Eddy, MD   5 mg at 01/04/24 0947   buprenorphine-naloxone (SUBOXONE) 8-2 mg per SL tablet 1 tablet  1 tablet Sublingual BID Willy Eddy, MD   1 tablet at 01/04/24 2134   polyethylene glycol (MIRALAX / GLYCOLAX) packet 17 g  17 g Oral Daily Sharman Cheek, MD   17 g at 01/05/24 0102   Current Outpatient Medications  Medication Sig Dispense Refill   amLODipine (NORVASC) 5 MG tablet TAKE  1 TABLET BY MOUTH IN THE MORNING 90 tablet 0   SUBOXONE 8-2 MG FILM Place under the tongue 2 (two) times daily.     acetaminophen (TYLENOL) 500 MG tablet Take 1-2 tabs as needed every 6 hrs for pain (Patient not taking: Reported on 11/21/2023) 60 tablet 0   furosemide (LASIX) 40 MG tablet Take to 1 tablet (40 mg total) by mouth ONCE OR TWICE daily (total daily dose maximum 80 mg) as needed for up to 3 days for increased leg swelling,  shortness of breath, weight gain 5+ lbs over 1-2 days. Seek medical care if these symptoms are not improving with increased dose. (Patient not taking: Reported on 11/21/2023) 30 tablet 0   ibuprofen (ADVIL) 800 MG tablet Take 1 tablet (800 mg total) by mouth every 6 (six) hours as needed. (Patient not taking: Reported on 11/21/2023) 60 tablet 1   Vibegron (GEMTESA) 75 MG TABS Take 75 mg by mouth daily. (Patient not taking: Reported on 11/21/2023) 90 tablet 3     Discharge Medications: Please see discharge summary for a list of discharge medications.  Relevant Imaging Results:  Relevant Lab Results:   Additional Information SSN [id number] [place], [place]

## 2024-01-05 NOTE — Progress Notes (Addendum)
       RE:  Alexander Avery    Date of Birth:  06-10-1956  Date:   01/05/2024     To Whom It May Concern:  Please be advised that the above-named patient will require a short-term nursing home stay - anticipated 30 days or less for rehabilitation and strengthening.  The plan is for return home.    Date

## 2024-01-05 NOTE — ED Notes (Signed)
 Hospital meal provided, pt tolerated w/o complaints.  Waste discarded appropriately.

## 2024-01-05 NOTE — TOC Progression Note (Addendum)
 Transition of Care [facility]) - Progression Note    Patient Details  Name: Alexander Avery MRN: [id number] Date of Birth: [birth year]  Transition of Care [facility]) CM/SW Contact  [name], [place] Phone Number: 01/05/2024, 9:43 AM  Clinical Narrative:      CSW notified by ED secretary that they received call from [name] [phone]  stating he was pt's guardian though was unsuccessful in faxing papers to unit.   CSW called [name]; he states he will email CSW papers. Rocky Link explains that he became pt's guardian in the last week. He states he believes pt was evicted from his home. Rocky Link is requesting short term rehab placement for pt. Potential LTC at SNF vs [facility]. PT recommending SNF. Fl2 completed and bed request sent in hub.   PASRR pending. Will upload fl2 and 30 day note to [facility] once co-signed my MD.         Social Determinants of Health (SDOH) Interventions SDOH Screenings   Alcohol Screen: High Risk (11/26/2021)  Tobacco Use: High Risk (01/03/2024)    Readmission Risk Interventions     No data to display

## 2024-01-05 NOTE — ED Notes (Signed)
 Pt states he is looking for his wallet. This RN helped look for wallet under the bed and in belongings bags. No wallet was found.

## 2024-01-06 DIAGNOSIS — S0990XA Unspecified injury of head, initial encounter: Secondary | ICD-10-CM | POA: Diagnosis not present

## 2024-01-06 LAB — CBC WITH DIFFERENTIAL/PLATELET
Abs Immature Granulocytes: 0.03 10*3/uL (ref 0.00–0.07)
Basophils Absolute: 0 10*3/uL (ref 0.0–0.1)
Basophils Relative: 0 %
Eosinophils Absolute: 0.2 10*3/uL (ref 0.0–0.5)
Eosinophils Relative: 3 %
HCT: 28.5 % — ABNORMAL LOW (ref 39.0–52.0)
Hemoglobin: 10 g/dL — ABNORMAL LOW (ref 13.0–17.0)
Immature Granulocytes: 0 %
Lymphocytes Relative: 15 %
Lymphs Abs: 1.3 10*3/uL (ref 0.7–4.0)
MCH: 27.7 pg (ref 26.0–34.0)
MCHC: 35.1 g/dL (ref 30.0–36.0)
MCV: 78.9 fL — ABNORMAL LOW (ref 80.0–100.0)
Monocytes Absolute: 1 10*3/uL (ref 0.1–1.0)
Monocytes Relative: 11 %
Neutro Abs: 6 10*3/uL (ref 1.7–7.7)
Neutrophils Relative %: 71 %
Platelets: 210 10*3/uL (ref 150–400)
RBC: 3.61 MIL/uL — ABNORMAL LOW (ref 4.22–5.81)
RDW: 14.2 % (ref 11.5–15.5)
WBC: 8.5 10*3/uL (ref 4.0–10.5)
nRBC: 0 % (ref 0.0–0.2)

## 2024-01-06 NOTE — TOC Progression Note (Signed)
 Transition of Care [facility]) - Progression Note    Patient Details  Name: Alexander Avery MRN: [id number] Date of Birth: January 23, 1956  Transition of Care [facility]) CM/SW Contact  [name], [place] Phone Number: 01/06/2024, 8:37 AM  Clinical Narrative:     Pt has no SNF bed offers. CSW contacted [employer] liaison and requested they review referral.        Social Determinants of Health (SDOH) Interventions SDOH Screenings   Alcohol Screen: High Risk (11/26/2021)  Tobacco Use: High Risk (01/03/2024)    Readmission Risk Interventions     No data to display

## 2024-01-06 NOTE — ED Notes (Signed)
Pt given coffee as requested.

## 2024-01-06 NOTE — ED Notes (Signed)
 VOL/Pending TOC Placement

## 2024-01-06 NOTE — ED Notes (Signed)
 Pt eating lunch tray

## 2024-01-06 NOTE — ED Notes (Signed)
 This EDT asked Pt to sit down multiple times due to being a fall risk. Pt refusing to sit down at this time stating "I'm not a fall risk but you are". Pt standing beside bed at this time. RN notified

## 2024-01-06 NOTE — NC FL2 (Signed)
 [place] MEDICAID FL2 LEVEL OF CARE FORM     IDENTIFICATION  Patient Name: Alexander Avery Birthdate: Dec 07, 1955 Sex: male Admission Date (Current Location): 01/03/2024  [facility] and [place] Number:  Chiropodist and Address:  [facility], [address] [place], [place] [address]      Provider Number: [id number]  Attending Physician Name and Address:  Sharman Cheek  [address] [place], [place] [address]  Relative Name and Phone Number:  [name] [phone]    Current Level of Care: Hospital Recommended Level of Care: Skilled Nursing Facility Prior Approval Number:    Date Approved/Denied:   PASRR Number: pending  Discharge Plan: SNF    Current Diagnoses: Patient Active Problem List   Diagnosis Date Noted   Other specified anemias 11/21/2023   Gastrointestinal hemorrhage 10/04/2023   Rib pain on right side 08/30/2023   Physical debility 06/03/2023   Bilateral leg edema 02/26/2023   Depression with anxiety 02/26/2023   Chronic diastolic CHF (congestive heart failure) (HCC) 02/26/2023   Protein-calorie malnutrition, moderate (HCC) 02/26/2023   Tobacco abuse 02/26/2023   Fall at home, initial encounter 02/26/2023   Near syncope 02/20/2023   Primary hypertension 02/20/2023   Leg swelling 02/20/2023   Cocaine abuse with cocaine-induced mood disorder (HCC) 04/18/2022   Opiate withdrawal (HCC) 11/28/2021   Severe episode of recurrent major depressive disorder, without psychotic features (HCC) 11/26/2021   Polysubstance (including opioids) dependence, daily use (HCC) 11/26/2021   Polysubstance abuse (HCC) 04/18/2020   Prostate cancer (HCC) 02/20/2019    Orientation RESPIRATION BLADDER Height & Weight     Self, Time, Place, Situation  Normal Continent Weight: 125 lb (56.7 kg) Height:  5\' 6"  (167.6 cm)  BEHAVIORAL SYMPTOMS/MOOD NEUROLOGICAL BOWEL NUTRITION STATUS      Continent Diet (See d/c summary)  AMBULATORY STATUS  COMMUNICATION OF NEEDS Skin   Limited Assist Verbally Normal                       Personal Care Assistance Level of Assistance  Bathing, Feeding, Dressing Bathing Assistance: Limited assistance Feeding assistance: Independent Dressing Assistance: Limited assistance     Functional Limitations Info  Speech, Hearing, Sight Sight Info: Adequate Hearing Info: Adequate Speech Info: Adequate    SPECIAL CARE FACTORS FREQUENCY  PT (By licensed PT), OT (By licensed OT)     PT Frequency: 5x/week OT Frequency: 5x/week            Contractures Contractures Info: Not present    Additional Factors Info  Code Status, Allergies Code Status Info: FUll code Allergies Info: Penicillins           Current Medications (01/06/2024):  This is the current hospital active medication list Current Facility-Administered Medications  Medication Dose Route Frequency Provider Last Rate Last Admin   alum & mag hydroxide-simeth (MAALOX/MYLANTA) 200-200-20 MG/5ML suspension 30 mL  30 mL Oral Q4H PRN Phineas Semen, MD   30 mL at 01/04/24 1621   amLODipine (NORVASC) tablet 5 mg  5 mg Oral q morning Willy Eddy, MD   5 mg at 01/06/24 0865   buprenorphine-naloxone (SUBOXONE) 8-2 mg per SL tablet 1 tablet  1 tablet Sublingual BID Willy Eddy, MD   1 tablet at 01/06/24 7846   polyethylene glycol (MIRALAX / GLYCOLAX) packet 17 g  17 g Oral Daily Sharman Cheek, MD   17 g at 01/05/24 9629   Current Outpatient Medications  Medication Sig Dispense Refill  amLODipine (NORVASC) 5 MG tablet TAKE 1 TABLET BY MOUTH IN THE MORNING 90 tablet 0   SUBOXONE 8-2 MG FILM Place under the tongue 2 (two) times daily.     acetaminophen (TYLENOL) 500 MG tablet Take 1-2 tabs as needed every 6 hrs for pain (Patient not taking: Reported on 11/21/2023) 60 tablet 0   furosemide (LASIX) 40 MG tablet Take to 1 tablet (40 mg total) by mouth ONCE OR TWICE daily (total daily dose maximum 80 mg) as needed for up to 3  days for increased leg swelling, shortness of breath, weight gain 5+ lbs over 1-2 days. Seek medical care if these symptoms are not improving with increased dose. (Patient not taking: Reported on 11/21/2023) 30 tablet 0   ibuprofen (ADVIL) 800 MG tablet Take 1 tablet (800 mg total) by mouth every 6 (six) hours as needed. (Patient not taking: Reported on 11/21/2023) 60 tablet 1   Vibegron (GEMTESA) 75 MG TABS Take 75 mg by mouth daily. (Patient not taking: Reported on 11/21/2023) 90 tablet 3     Discharge Medications: Please see discharge summary for a list of discharge medications.  Relevant Imaging Results:  Relevant Lab Results:   Additional Information SSN [id number] [place], [place]

## 2024-01-06 NOTE — ED Notes (Addendum)
 Pt at this time resting on stretcher in NAD. Bed alarm on, pt told to let RN if needs anything. Home meds verified w/ pt.

## 2024-01-06 NOTE — ED Provider Notes (Addendum)
 Emergency Medicine Observation Re-evaluation Note  Physical Exam   BP (!) 145/69 (BP Location: Left Arm)   Pulse 65   Temp 98.7 F (37.1 C) (Oral)   Resp 18   Ht 5\' 6"  (1.676 m)   Wt 56.7 kg   SpO2 96%   BMI 20.18 kg/m   Patient appears in no acute distress.  ED Course / MDM    He had a bowel movement with blood /clots .  He does not take blood thinners.  He has no pain.  He has a soft nontender abdomen.  We will recheck CBC (normal and unchanged from prior) and continue to monitor for recurrence.  Pt is awaiting dispo from consultants   Pilar Jarvis MD   Pilar Jarvis, MD 01/06/24 Claudie Fisherman, MD 01/06/24 Daiva Huge    Pilar Jarvis, MD 01/06/24 [phone]

## 2024-01-06 NOTE — TOC Progression Note (Addendum)
 Transition of Care [facility]) - Progression Note    Patient Details  Name: Alexander Avery MRN: [id number] Date of Birth: 10/18/1956  [facility]) CM/SW Contact  [name], [place] Phone Number: 01/06/2024, 2:53 PM  Clinical Narrative:      Still no SNF bed offers. CSW called and updated [name] with [facility]  TOC will continue to follow.   Pt's Guardianship paperwork list [name]. [name] is Primary contact([phone]). This info has been updated in pt's chart. Guardianship paperwork provided to secretary to be scanned to chart.             Social Determinants of Health (SDOH) Interventions SDOH Screenings   Alcohol Screen: High Risk (11/26/2021)  Tobacco Use: High Risk (01/03/2024)    Readmission Risk Interventions     No data to display

## 2024-01-06 NOTE — ED Notes (Signed)
 Pt ambulates w/ steady gait to BR w/ walker.

## 2024-01-06 NOTE — ED Notes (Signed)
 Pt bed nasty. This RN and tech cleaned bed and placed new linens. Pt also dressed out as he is a boarder and is still in street clothes. White sneakers [employer] social research officer - kept for mobility [employer] - kept  [employer] hoodie

## 2024-01-06 NOTE — ED Notes (Signed)
 Pt repeatedly asking for cookies. Dietary contacted.

## 2024-01-06 NOTE — ED Notes (Signed)
 Pt standing up, attempted to assist pt and asked pt to sit down d/t high fall risk. Pt refuses.

## 2024-01-07 DIAGNOSIS — S0990XA Unspecified injury of head, initial encounter: Secondary | ICD-10-CM | POA: Diagnosis not present

## 2024-01-07 NOTE — ED Notes (Addendum)
 Pt ambulates w/ walker to BR w/ steady gait. Given something to eat.

## 2024-01-07 NOTE — ED Notes (Signed)
Pt resting in bed at this time. Chest rise and fall noted.

## 2024-01-07 NOTE — ED Notes (Signed)
Patient is vol pending placement 

## 2024-01-07 NOTE — TOC Progression Note (Signed)
 Transition of Care [facility]) - Progression Note    Patient Details  Name: Alexander Avery MRN: [id number] Date of Birth: 1956/07/19  Transition [facility]) CM/SW Contact  Liliana Cline, LCSW Phone Number: 01/07/2024, 9:28 AM  Clinical Narrative:    Checked [place] [facility] - PASRR requesting additional info. Unable to upload due to error with organization - asked [facility] Leadership for guidance.        Expected Discharge Plan and Services                                               Social Determinants of Health (SDOH) Interventions SDOH Screenings   Alcohol Screen: High Risk (11/26/2021)  Tobacco Use: High Risk (01/03/2024)    Readmission Risk Interventions     No data to display

## 2024-01-07 NOTE — ED Notes (Signed)
vol/toc placement.. 

## 2024-01-07 NOTE — ED Notes (Signed)
 This NT offered pt pm snack but pt refused at this time.

## 2024-01-07 NOTE — ED Notes (Signed)
 Pt bed linen changed and changed into clean scrubs with new socks

## 2024-01-07 NOTE — ED Notes (Signed)
 Pt ambulates w/ steady gait to BR w/ walker.

## 2024-01-07 NOTE — ED Notes (Signed)
 Pt eating dinner at this time

## 2024-01-07 NOTE — ED Provider Notes (Signed)
-----------------------------------------   6:36 AM on 01/07/2024 -----------------------------------------   Blood pressure 138/72, pulse 65, temperature 98.9 F (37.2 C), temperature source Oral, resp. rate 20, height 5\' 6"  (1.676 m), weight 56.7 kg, SpO2 100%.  The patient is calm and cooperative at this time.  There have been no acute events since the last update.  Awaiting disposition plan from Social Work team.   Irean Hong, MD 01/07/24 [phone]

## 2024-01-07 NOTE — ED Provider Notes (Signed)
 Emergency Medicine Observation Re-evaluation Note  Alexander Avery is a 68 y.o. male, seen on rounds today.  Pt initially presented to the ED for complaints of Fall  Currently, the patient is resting in bed. No reported issues from nursing team.   Physical Exam  BP 138/72   Pulse 65   Temp 98.9 F (37.2 C) (Oral)   Resp 20   Ht 5\' 6"  (1.676 m)   Wt 56.7 kg   SpO2 100%   BMI 20.18 kg/m  Physical Exam General: Resting in bed  ED Course / MDM   CBC with differential from yesterday morning with stable anemia, normal white blood cell count, no other labs in the last 24 hours.  Plan  Current plan is for dispo per social work.    Trinna Post, MD 01/07/24 [phone]

## 2024-01-08 DIAGNOSIS — S0990XA Unspecified injury of head, initial encounter: Secondary | ICD-10-CM | POA: Diagnosis not present

## 2024-01-08 NOTE — ED Notes (Signed)
 Pt c/o constipation. Pt asked this nurse for PRN medication. Mylanta provided by this RN

## 2024-01-08 NOTE — ED Notes (Signed)
 Area cleaned of trash.

## 2024-01-08 NOTE — ED Provider Notes (Signed)
-----------------------------------------   6:06 AM on 01/08/2024 -----------------------------------------   Blood pressure 138/72, pulse 65, temperature 98.9 F (37.2 C), temperature source Oral, resp. rate 20, height 5\' 6"  (1.676 m), weight 56.7 kg, SpO2 100%.  The patient is calm and cooperative at this time.  There have been no acute events since the last update.  Awaiting disposition plan from Social Work team.   Irean Hong, MD 01/08/24 [phone]

## 2024-01-08 NOTE — ED Notes (Signed)
 Wife at bedside.

## 2024-01-08 NOTE — ED Notes (Signed)
 Pt taking shower. Pt was given hygiene items and the following, 1 clean top, 1 clean bottom, with 1 pair of disposable underwear.  Pt changed out into clean clothing.  Staff disposed of all shower supplies.

## 2024-01-08 NOTE — ED Notes (Signed)
 Full linen change provided by this RN

## 2024-01-08 NOTE — ED Notes (Signed)
 VOL  PENDING  TOC  PLACEMENT

## 2024-01-08 NOTE — ED Notes (Signed)
Pt given pm snack

## 2024-01-08 NOTE — ED Notes (Signed)
 Pt ambulatory to the bathroom using Rolator and returned to bed without incident.

## 2024-01-08 NOTE — ED Notes (Signed)
 Pt redirected to bed after found standing in another pts room

## 2024-01-09 DIAGNOSIS — S0990XA Unspecified injury of head, initial encounter: Secondary | ICD-10-CM | POA: Diagnosis not present

## 2024-01-09 NOTE — ED Provider Notes (Signed)
-----------------------------------------   7:18 AM on 01/09/2024 -----------------------------------------   Blood pressure (!) 159/76, pulse 64, temperature 99.1 F (37.3 C), temperature source Oral, resp. rate 17, height 1.676 m (5\' 6" ), weight 56.7 kg, SpO2 97%.  The patient is calm and cooperative at this time.  There have been no acute events since the last update.  Awaiting disposition plan from [facility] team.   Loleta Rose, MD 01/09/24 [phone]

## 2024-01-09 NOTE — ED Notes (Signed)
 Pt received snack and drink

## 2024-01-09 NOTE — ED Notes (Signed)
 Pt ambulatory with roller-walker around unit without incident and back to bed.

## 2024-01-09 NOTE — ED Notes (Signed)
Patient resting in no distress

## 2024-01-09 NOTE — ED Notes (Signed)
 Vol  pending  TOC  placement

## 2024-01-09 NOTE — TOC Progression Note (Addendum)
 Transition of Care [facility]) - Progression Note    Patient Details  Name: Alexander Avery MRN: [id number] Date of Birth: [birth year]  [facility]) CM/SW Contact  Margarito Liner, LCSW Phone Number: 01/09/2024, 8:50 AM  Clinical Narrative:  No SNF bed offers this morning. Extended search. CSW uploaded requested clinicals into  Must for PASARR review.   11:20 am: PASARR obtained: [id number]. Expires 3/26.  Expected Discharge Plan and Services                                               Social Determinants of Health (SDOH) Interventions SDOH Screenings   Alcohol Screen: High Risk (11/26/2021)  Tobacco Use: High Risk (01/03/2024)    Readmission Risk Interventions     No data to display

## 2024-01-10 DIAGNOSIS — S0990XA Unspecified injury of head, initial encounter: Secondary | ICD-10-CM | POA: Diagnosis not present

## 2024-01-10 NOTE — ED Notes (Signed)
 PT bedside

## 2024-01-10 NOTE — ED Notes (Signed)
Hospital meal provided, pt tolerated w/o complaints.

## 2024-01-10 NOTE — ED Provider Notes (Signed)
 Emergency Medicine Observation Re-evaluation Note  Alexander Avery is a 68 y.o. male, seen on rounds today.  Pt initially presented to the ED for complaints of Fall Currently, the patient is resting.  Physical Exam  BP 131/70 (BP Location: Left Arm)   Pulse 71   Temp 99.1 F (37.3 C) (Oral)   Resp 18   Ht 5\' 6"  (1.676 m)   Wt 56.7 kg   SpO2 100%   BMI 20.18 kg/m  Physical Exam .Gen:  No acute distress Resp:  Breathing easily and comfortably, no accessory muscle usage Neuro:  Moving all four extremities, no gross focal neuro deficits Psych:  Resting currently, calm when awake   ED Course / MDM  EKG:   I have reviewed the labs performed to date as well as medications administered while in observation.  Recent changes in the last 24 hours include no acute events.  Plan  Current plan is for TOC placement.    Claybon Jabs, MD 01/10/24 [phone]

## 2024-01-10 NOTE — Progress Notes (Signed)
 Physical Therapy Treatment Patient Details Name: Alexander Avery MRN: [id number] DOB: 1956-06-29 Today's Date: 01/10/2024   History of Present Illness Alexander Avery is a 68 y.o. male who presents to the ER for homelessness.  He was brought in by DSS because he is reportedly evicted today.  He fell the day before coming to the ED and reports 8-12 falls in the last month. He was admitted with diagnoses of frequent falls and homelessness.    PT Comments  Session focused on gait.  He is able to walk extended distances in department with rollator and general supervision and cues for safety.  He does have trouble making close turns and backwards gait noted with general balance deficits.  Overall he is progressing well with mobility but does report some general soreness from not being able to walk much while he is here.  Will try to bring in mobility team more often for increased gait.    If plan is discharge home, recommend the following: A little help with walking and/or transfers;Assistance with cooking/housework;Assist for transportation;Help with stairs or ramp for entrance;A little help with bathing/dressing/bathroom   Can travel by private vehicle     Yes  Equipment Recommendations  None recommended by PT    Recommendations for Other Services       Precautions / Restrictions Precautions Precautions: Fall Restrictions Weight Bearing Restrictions Per Provider Order: No     Mobility  Bed Mobility               General bed mobility comments: sitting EOB before and after session Patient Response: Cooperative  Transfers Overall transfer level: Needs assistance Equipment used: Rollator (4 wheels) Transfers: Sit to/from Stand Sit to Stand: Supervision                Ambulation/Gait Ambulation/Gait assistance: Supervision Gait Distance (Feet): 1000 Feet Assistive device: Rollator (4 wheels) Gait Pattern/deviations: Step-through pattern, Shuffle, Decreased step  length - left, Decreased step length - right, Trunk flexed Gait velocity: dec     General Gait Details: generally short shuffling steps and balance deficits but needs no physical assist to walk, supervision for general safety and cues   Stairs             Wheelchair Mobility     Tilt Bed Tilt Bed Patient Response: Cooperative  Modified Rankin (Stroke Patients Only)       Balance Overall balance assessment: Needs assistance Sitting-balance support: No upper extremity supported, Feet supported Sitting balance-Leahy Scale: Good     Standing balance support: Bilateral upper extremity supported, During functional activity, Reliant on assistive device for balance Standing balance-Leahy Scale: Fair                              Communication    Cognition Arousal: Alert Behavior During Therapy: WFL for tasks assessed/performed   PT - Cognitive impairments: No family/caregiver present to determine baseline                                Cueing    Exercises      General Comments        Pertinent Vitals/Pain Pain Assessment Pain Assessment: Faces Faces Pain Scale: Hurts a little bit Pain Location: back pain from immobility Pain Descriptors / Indicators: Sore Pain Intervention(s): Monitored during session    Home Living  Prior Function            PT Goals (current goals can now be found in the care plan section) Progress towards PT goals: Progressing toward goals    Frequency    Min 1X/week      PT Plan      Co-evaluation              AM-PAC PT "6 Clicks" Mobility   Outcome Measure  Help needed turning from your back to your side while in a flat bed without using bedrails?: None Help needed moving from lying on your back to sitting on the side of a flat bed without using bedrails?: None Help needed moving to and from a bed to a chair (including a wheelchair)?: None Help needed  standing up from a chair using your arms (e.g., wheelchair or bedside chair)?: None Help needed to walk in hospital room?: A Little Help needed climbing 3-5 steps with a railing? : A Lot 6 Click Score: 21    End of Session   Activity Tolerance: Patient tolerated treatment well;No increased pain;Patient limited by fatigue Patient left: in bed;with nursing/sitter in room (Sitting EOB on stretcher in hallway) Nurse Communication: Mobility status PT Visit Diagnosis: History of falling (Z91.81);Muscle weakness (generalized) (M62.81);Other abnormalities of gait and mobility (R26.89)     Time: 1610-9604 PT Time Calculation (min) (ACUTE ONLY): 14 min  Charges:    $Gait Training: 8-22 mins PT General Charges $$ ACUTE PT VISIT: 1 Visit                   Danielle Dess, PTA 01/10/24, 10:38 AM

## 2024-01-10 NOTE — ED Notes (Signed)
 Patient in bed resting in no distress. Will continue to monitor.

## 2024-01-10 NOTE — ED Notes (Signed)
 Vol toc placement

## 2024-01-10 NOTE — ED Notes (Signed)
 Pt ambulated to bathroom, Pt cleaned himself, and changed pants as well as washed himself and brushed his teeth. Pt ambulated w/o incident and back in bed.

## 2024-01-10 NOTE — TOC Progression Note (Addendum)
 Transition of Care [facility]) - Progression Note    Patient Details  Name: Alexander Avery MRN: [id number] Date of Birth: 03-05-1956  [facility]) CM/SW Contact  Margarito Liner, LCSW Phone Number: 01/10/2024, 12:01 PM  Clinical Narrative:   Patient is walking 1000 feet with PT. CSW updated SNF referral to note only looking for LTC. Patient does have Medicaid. CSW called and updated guardian. He is going to call someone he knows to see if he can be placed at that individuals facility.  2:46 pm: Received call from guardian. He has a family care home that would like to review patient's referral. CSW sent clinicals to guardian to forward to the facility.  Expected Discharge Plan and Services                                               Social Determinants of Health (SDOH) Interventions SDOH Screenings   Alcohol Screen: High Risk (11/26/2021)  Tobacco Use: High Risk (01/03/2024)    Readmission Risk Interventions     No data to display

## 2024-01-10 NOTE — NC FL2 (Signed)
 [place] MEDICAID FL2 LEVEL OF CARE FORM     IDENTIFICATION  Patient Name: Alexander Avery Birthdate: 11/03/1956 Sex: male Admission Date (Current Location): 01/03/2024  [facility] and [place] Number:  Chiropodist and Address:  [facility], [address], [place], [place] [address]      Provider Number: [id number]  Attending Physician Name and Address:  No att. providers found  Relative Name and Phone Number:  [name] [phone]    Current Level of Care: Hospital Recommended Level of Care: Family Care Home Prior Approval Number:    Date Approved/Denied:   PASRR Number:   Discharge Plan: Other (Comment) [facility])    Current Diagnoses: Patient Active Problem List   Diagnosis Date Noted   Other specified anemias 11/21/2023   Gastrointestinal hemorrhage 10/04/2023   Rib pain on right side 08/30/2023   Physical debility 06/03/2023   Bilateral leg edema 02/26/2023   Depression with anxiety 02/26/2023   Chronic diastolic CHF (congestive heart failure) (HCC) 02/26/2023   Protein-calorie malnutrition, moderate (HCC) 02/26/2023   Tobacco abuse 02/26/2023   Fall at home, initial encounter 02/26/2023   Near syncope 02/20/2023   Primary hypertension 02/20/2023   Leg swelling 02/20/2023   Cocaine abuse with cocaine-induced mood disorder (HCC) 04/18/2022   Opiate withdrawal (HCC) 11/28/2021   Severe episode of recurrent major depressive disorder, without psychotic features (HCC) 11/26/2021   Polysubstance (including opioids) dependence, daily use (HCC) 11/26/2021   Polysubstance abuse (HCC) 04/18/2020   Prostate cancer (HCC) 02/20/2019    Orientation RESPIRATION BLADDER Height & Weight     Self, Time, Situation, Place  Normal Continent Weight: 125 lb (56.7 kg) Height:  5\' 6"  (167.6 cm)  BEHAVIORAL SYMPTOMS/MOOD NEUROLOGICAL BOWEL NUTRITION STATUS   (None)  (None) Continent Diet (Regular)  AMBULATORY STATUS COMMUNICATION OF NEEDS Skin    Supervision Verbally Normal                       Personal Care Assistance Level of Assistance      Functional Limitations Info  Sight, Hearing, Speech Sight Info: Adequate Hearing Info: Adequate Speech Info: Adequate    SPECIAL CARE FACTORS FREQUENCY                  Contractures Contractures Info: Not present    Additional Factors Info  Code Status, Allergies Code Status Info: Full code Allergies Info: Penicillins           Current Medications (01/10/2024):  This is the current hospital active medication list Current Facility-Administered Medications  Medication Dose Route Frequency Provider Last Rate Last Admin   alum & mag hydroxide-simeth (MAALOX/MYLANTA) 200-200-20 MG/5ML suspension 30 mL  30 mL Oral Q4H PRN Phineas Semen, MD   30 mL at 01/10/24 0933   amLODipine (NORVASC) tablet 5 mg  5 mg Oral q morning Willy Eddy, MD   5 mg at 01/10/24 8756   buprenorphine-naloxone (SUBOXONE) 8-2 mg per SL tablet 1 tablet  1 tablet Sublingual BID Willy Eddy, MD   1 tablet at 01/10/24 0933   polyethylene glycol (MIRALAX / GLYCOLAX) packet 17 g  17 g Oral Daily Sharman Cheek, MD   17 g at 01/10/24 4332   Current Outpatient Medications  Medication Sig Dispense Refill   amLODipine (NORVASC) 5 MG tablet TAKE 1 TABLET BY MOUTH IN THE MORNING 90 tablet 0   SUBOXONE 8-2 MG FILM Place under the tongue 2 (two) times daily.  acetaminophen (TYLENOL) 500 MG tablet Take 1-2 tabs as needed every 6 hrs for pain (Patient not taking: Reported on 11/21/2023) 60 tablet 0   furosemide (LASIX) 40 MG tablet Take to 1 tablet (40 mg total) by mouth ONCE OR TWICE daily (total daily dose maximum 80 mg) as needed for up to 3 days for increased leg swelling, shortness of breath, weight gain 5+ lbs over 1-2 days. Seek medical care if these symptoms are not improving with increased dose. (Patient not taking: Reported on 11/21/2023) 30 tablet 0   ibuprofen (ADVIL) 800 MG tablet Take  1 tablet (800 mg total) by mouth every 6 (six) hours as needed. (Patient not taking: Reported on 11/21/2023) 60 tablet 1   Vibegron (GEMTESA) 75 MG TABS Take 75 mg by mouth daily. (Patient not taking: Reported on 11/21/2023) 90 tablet 3     Discharge Medications: Please see discharge summary for a list of discharge medications.  Relevant Imaging Results:  Relevant Lab Results:   Additional Information SS#: [id number]  Margarito Liner, LCSW

## 2024-01-11 DIAGNOSIS — S0990XA Unspecified injury of head, initial encounter: Secondary | ICD-10-CM | POA: Diagnosis not present

## 2024-01-11 NOTE — ED Notes (Signed)
 Pt ambulating with roller walker without incident and back to bed.

## 2024-01-11 NOTE — ED Provider Notes (Signed)
-----------------------------------------   4:27 AM on 01/11/2024 -----------------------------------------   Blood pressure 136/69, pulse 62, temperature 98 F (36.7 C), temperature source Oral, resp. rate 17, height 5\' 6"  (1.676 m), weight 56.7 kg, SpO2 100%.  The patient is calm and cooperative at this time.  There have been no acute events since the last update.  Awaiting disposition plan from case management/social work.    Lummie Montijo, Layla Maw, DO 01/11/24 [phone]

## 2024-01-11 NOTE — ED Notes (Signed)
 Pt ambulated to the BR, clothes changed and Pt cleaned himself up. Pt stated he had stitches to his right upper eyebrow to be removed. Made EDP/Charge RN aware as stitches are barley visible. Pt had (4) placed 11/15/2023 and needed them removed 3-5days post procedure.

## 2024-01-11 NOTE — ED Notes (Signed)
 Patient resting in no distress. Will continue to monitor.

## 2024-01-11 NOTE — ED Notes (Signed)
 Pt c/o indigestion, gave PRN

## 2024-01-11 NOTE — ED Notes (Signed)
 RN and provider assisted suture removal x4 from R eyebrow.

## 2024-01-11 NOTE — ED Notes (Signed)
 This NT provided patient a cup of ice water and a carton of milk.

## 2024-01-11 NOTE — ED Notes (Signed)
 Dinner tray and ice water provided to patient.

## 2024-01-12 DIAGNOSIS — S0990XA Unspecified injury of head, initial encounter: Secondary | ICD-10-CM | POA: Diagnosis not present

## 2024-01-12 NOTE — ED Provider Notes (Signed)
 Emergency Medicine Observation Re-evaluation Note  Physical Exam   BP 136/69 (BP Location: Left Arm)   Pulse 66   Temp 98.2 F (36.8 C)   Resp 18   Ht 5\' 6"  (1.676 m)   Wt 56.7 kg   SpO2 98%   BMI 20.18 kg/m   Patient appears in no acute distress.  ED Course / MDM   No reported events during my shift at the time of this note.   Pt is awaiting dispo from consultants   Pilar Jarvis MD    Pilar Jarvis, MD 01/12/24 [phone]

## 2024-01-12 NOTE — ED Notes (Signed)
 Pt given a warm blanket. Pt sitting on rollator at end of bed

## 2024-01-12 NOTE — TOC Progression Note (Signed)
 Transition of Care [facility]) - Progression Note    Patient Details  Name: Alexander Avery MRN: [id number] Date of Birth: [birth year]  [facility]) CM/SW Contact  Margarito Liner, LCSW Phone Number: 01/12/2024, 11:57 AM  Clinical Narrative: Guardian will follow up with the group home that the patient's referral was sent to.    Expected Discharge Plan and Services                                               Social Determinants of Health (SDOH) Interventions SDOH Screenings   Alcohol Screen: High Risk (11/26/2021)  Tobacco Use: High Risk (01/03/2024)    Readmission Risk Interventions     No data to display

## 2024-01-12 NOTE — ED Notes (Signed)
Lunch tray provided for pt

## 2024-01-12 NOTE — ED Notes (Signed)
VOL/pending placement 

## 2024-01-12 NOTE — ED Notes (Signed)
 Patient sleeping at this time, chest rise and fall observed.

## 2024-01-12 NOTE — ED Notes (Signed)
 Pt eating dinner at this time

## 2024-01-12 NOTE — ED Provider Notes (Signed)
-----------------------------------------   11:32 PM on 01/12/2024 -----------------------------------------   Blood pressure (!) 147/88, pulse 69, temperature 98 F (36.7 C), resp. rate 16, height 5\' 6"  (1.676 m), weight 56.7 kg, SpO2 100%.  The patient is calm and cooperative at this time.  There have been no acute events since the last update.  Awaiting disposition plan from [facility] team.   Janith Lima, MD 01/12/24 [phone]

## 2024-01-12 NOTE — ED Notes (Signed)
 Pt given warm blanket.

## 2024-01-12 NOTE — ED Notes (Signed)
VOL/Pending Placement 

## 2024-01-13 DIAGNOSIS — S0990XA Unspecified injury of head, initial encounter: Secondary | ICD-10-CM | POA: Diagnosis not present

## 2024-01-13 MED ORDER — TUBERCULIN PPD 5 UNIT/0.1ML ID SOLN
5.0000 [IU] | INTRADERMAL | Status: AC
  Administered 2024-01-13: 5 [IU] via INTRADERMAL
  Filled 2024-01-13: qty 0.1

## 2024-01-13 NOTE — ED Notes (Signed)
 Vol/toc.Marland Kitchen

## 2024-01-13 NOTE — TOC Progression Note (Addendum)
 Transition of Care [facility]) - Progression Note    Patient Details  Name: Alexander Avery MRN: [id number] Date of Birth: 1956-03-28  Transition of Care [facility]) CM/SW Contact  Margarito Liner, LCSW Phone Number: 01/13/2024, 12:44 PM  Clinical Narrative:  CSW spoke to legal guardian. He should have an update from the [facility] later today.   2:42 pm: There is a facility in [place] that is interested in patient. They are requesting a TB skin test, COVID test within 72 hours of discharge, and FL2 signed by MD. CSW sent secure chat to MD requesting TB skin test. The administrator wants to complete a zoom meeting with patient tomorrow. Will have weekend TOC call her to coordinate. They use the [facility] in [place]. They can take him to [facility] in [place] to continue his Suboxone and can take him to [facility] in [place] until he can get into [facility].  Expected Discharge Plan and Services                                               Social Determinants of Health (SDOH) Interventions SDOH Screenings   Alcohol Screen: High Risk (11/26/2021)  Tobacco Use: High Risk (01/03/2024)    Readmission Risk Interventions     No data to display

## 2024-01-13 NOTE — ED Provider Notes (Signed)
-----------------------------------------   11:43 PM on 01/13/2024 -----------------------------------------   Blood pressure 128/67, pulse 71, temperature 97.9 F (36.6 C), temperature source Oral, resp. rate 17, height 5\' 6"  (1.676 m), weight 56.7 kg, SpO2 100%.  The patient is calm and cooperative at this time.  There have been no acute events since the last update.  Awaiting disposition plan from [facility] team.   Janith Lima, MD 01/13/24 [phone]

## 2024-01-13 NOTE — ED Notes (Signed)
 Pt given cereal and milk. Pt denies any other needs at  this time.

## 2024-01-13 NOTE — ED Notes (Signed)
 Pt resting in bed. Appears to be sleeping at this time. Chest rest and fall noted. No acute distress noted.

## 2024-01-13 NOTE — ED Provider Notes (Signed)
-----------------------------------------   6:54 AM on 01/13/2024 -----------------------------------------   Blood pressure (!) 147/88, pulse 69, temperature 98 F (36.7 C), resp. rate 16, height 5\' 6"  (1.676 m), weight 56.7 kg, SpO2 100%.  The patient is calm and cooperative at this time.  There have been no acute events since the last update.  Awaiting disposition plan from case management/social work.    Rosezella Kronick, Layla Maw, DO 01/13/24 [phone]

## 2024-01-14 DIAGNOSIS — S0990XA Unspecified injury of head, initial encounter: Secondary | ICD-10-CM | POA: Diagnosis not present

## 2024-01-14 MED ORDER — IBUPROFEN 400 MG PO TABS
400.0000 mg | ORAL_TABLET | Freq: Four times a day (QID) | ORAL | Status: DC | PRN
  Administered 2024-01-14: 400 mg via ORAL
  Filled 2024-01-14: qty 1

## 2024-01-14 NOTE — ED Provider Notes (Signed)
 Emergency Medicine Observation Re-evaluation Note  Physical Exam   BP 128/67 (BP Location: Right Arm)   Pulse 71   Temp 97.9 F (36.6 C) (Oral)   Resp 17   Ht 5\' 6"  (1.676 m)   Wt 56.7 kg   SpO2 100%   BMI 20.18 kg/m   Patient appears in no acute distress.  ED Course / MDM   No reported events during my shift at the time of this note.   Pt is awaiting dispo from consultants   Pilar Jarvis MD    Pilar Jarvis, MD 01/14/24 [phone]

## 2024-01-14 NOTE — TOC Progression Note (Signed)
 Transition of Care [facility]) - Progression Note    Patient Details  Name: Alexander Avery MRN: [id number] Date of Birth: 1956/01/23  [facility]) CM/SW Contact  Liliana Cline, LCSW Phone Number: 01/14/2024, 12:57 PM  Clinical Narrative:    CSW met with patient in ED Hall at his bedside per his request. Provided update that the facility can take him Tuesday. Patient requests address and TOC phone number. Provided information to patient on paper.        Expected Discharge Plan and Services                                               Social Determinants of Health (SDOH) Interventions SDOH Screenings   Alcohol Screen: High Risk (11/26/2021)  Tobacco Use: High Risk (01/03/2024)    Readmission Risk Interventions     No data to display

## 2024-01-14 NOTE — TOC Progression Note (Addendum)
 Transition of Care [facility]) - Progression Note    Patient Details  Name: Alexander Avery MRN: [id number] Date of Birth: [birth year]  Transition of Care [facility]) CM/SW Contact  Liliana Cline, LCSW Phone Number: 01/14/2024, 10:45 AM  Clinical Narrative:    CSW assisted patient with completing video meeting with Clarisse Gouge at [facility] in [place]. Patient was alert, calm, and pleasant. Patient and Clarisse Gouge were both agreeable to patient going to the facility to reside there.   Clarisse Gouge states she will need a TB test (confirmed with RN it was placed yesterday), COVID test within 72 hours of discharge, FL2 with discharge med list (hard copy sent in packet and copy emailed to her at [email or web address]). Clarisse Gouge states they cannot transport patient, that the hospital will need to arrange transport. The address is [address] [place], [place] [address]. CSW asked patient who states his sister would not be able to transport him. Clarisse Gouge states she will get patient set up with [facility] in [place], that [facility] does not need to do anything for this she will take care of it. Clarisse Gouge states the earliest she can accept patient is Tuesday. Updated RN asked to relay updates to patient.   CSW updated patient's Guardian [name] with [facility] by phone who was pleased with patient being accepted to [facility]. [facility] states they cannot provide transport either, that the hospital will need to arrange transport. [facility] is working on setting up a Lawyer for the patient but states this would not impact discharge.         Expected Discharge Plan and Services                                               Social Determinants of Health (SDOH) Interventions SDOH Screenings   Alcohol Screen: High Risk (11/26/2021)  Tobacco Use: High Risk (01/03/2024)    Readmission Risk Interventions     No data to display

## 2024-01-14 NOTE — ED Notes (Signed)
 Pt c/o LT shoulder pain and describes it as aching. RN placed heat pack to LT shoulder and alerted MD to pain.

## 2024-01-14 NOTE — ED Notes (Signed)
 Lunch tray given to pt.

## 2024-01-14 NOTE — ED Notes (Signed)
 This RN received report from Jamse Mead RN. This RN introduced self to pt. Call light in reach, bed wheels locked, side rail raised, pt updated on plan of care. Rounding completed.

## 2024-01-14 NOTE — ED Provider Notes (Signed)
   [facility] Observation Note   ----------------------------------------- 10:28 PM on 01/14/2024 -----------------------------------------  Alexander Avery is a 68 y.o. male currently boarding in the Emergency Department.  No acute events since last update.  Recent Vitals   Most recent vital signs: Vitals:   01/14/24 0939 01/14/24 1950  BP: (!) 167/88 (!) 147/87  Pulse: 71 70  Resp: 17 18  Temp: 98.4 F (36.9 C) 97.9 F (36.6 C)  SpO2: 100% 98%    ED Results / Procedures / Treatments   Labs (all labs ordered are listed, but only abnormal results are displayed) Labs Reviewed  CBC WITH DIFFERENTIAL/PLATELET - Abnormal; Notable for the following components:      Result Value   RBC 3.29 (*)    Hemoglobin 9.3 (*)    HCT 26.2 (*)    MCV 79.6 (*)    All other components within normal limits  COMPREHENSIVE METABOLIC PANEL - Abnormal; Notable for the following components:   BUN 30 (*)    Creatinine, Ser 1.81 (*)    Calcium 8.7 (*)    Albumin 3.0 (*)    AST 14 (*)    GFR, Estimated 40 (*)    All other components within normal limits  URINALYSIS, ROUTINE W REFLEX MICROSCOPIC - Abnormal; Notable for the following components:   Color, Urine YELLOW (*)    APPearance CLEAR (*)    Protein, ur 100 (*)    All other components within normal limits  CBC WITH DIFFERENTIAL/PLATELET - Abnormal; Notable for the following components:   RBC 3.58 (*)    Hemoglobin 10.0 (*)    HCT 28.9 (*)    All other components within normal limits  COMPREHENSIVE METABOLIC PANEL - Abnormal; Notable for the following components:   Glucose, Bld 124 (*)    BUN 26 (*)    Creatinine, Ser 1.82 (*)    Calcium 8.6 (*)    Albumin 2.9 (*)    AST 9 (*)    GFR, Estimated 40 (*)    All other components within normal limits  CBC WITH DIFFERENTIAL/PLATELET - Abnormal; Notable for the following components:   RBC 3.61 (*)    Hemoglobin 10.0 (*)    HCT 28.5 (*)    MCV 78.9 (*)     All other components within normal limits  LIPASE, BLOOD    MEDICATIONS ORDERED IN ED: Medications  amLODipine (NORVASC) tablet 5 mg (5 mg Oral Given 01/14/24 0936)  buprenorphine-naloxone (SUBOXONE) 8-2 mg per SL tablet 1 tablet (1 tablet Sublingual Given 01/14/24 2120)  alum & mag hydroxide-simeth (MAALOX/MYLANTA) 200-200-20 MG/5ML suspension 30 mL (30 mLs Oral Given 01/11/24 0659)  polyethylene glycol (MIRALAX / GLYCOLAX) packet 17 g (17 g Oral Given 01/14/24 0936)  tuberculin injection 5 Units (5 Units Intradermal Given 01/13/24 1606)  ibuprofen (ADVIL) tablet 400 mg (400 mg Oral Given 01/14/24 2120)  alum & mag hydroxide-simeth (MAALOX/MYLANTA) 200-200-20 MG/5ML suspension 30 mL (30 mLs Oral Given 01/04/24 1022)  acetaminophen (TYLENOL) tablet 650 mg (650 mg Oral Given 01/04/24 1956)     ED Plan   Currently awaiting placement into an appropriate living facility.  Social work is working with the patient to help achieve this.      Minna Antis, MD 01/14/24 2228

## 2024-01-14 NOTE — ED Notes (Signed)
 Patient is vol poss transfer on Tuesday

## 2024-01-14 NOTE — ED Notes (Signed)
 Pt given drink

## 2024-01-14 NOTE — ED Notes (Signed)
Pt given meal and beverage

## 2024-01-14 NOTE — ED Notes (Signed)
 This RN gave report to Darryll Capers RN and performed bedside care handoff.

## 2024-01-14 NOTE — ED Notes (Signed)
 vol/toc.Marland KitchenMarland KitchenMarland Kitchen

## 2024-01-15 DIAGNOSIS — S0990XA Unspecified injury of head, initial encounter: Secondary | ICD-10-CM | POA: Diagnosis not present

## 2024-01-15 NOTE — ED Notes (Signed)
Pt given warm blanket and cup of water

## 2024-01-15 NOTE — ED Notes (Signed)
 Report given to [employer]

## 2024-01-15 NOTE — ED Notes (Signed)
Pt given snack at this time  

## 2024-01-15 NOTE — ED Notes (Signed)
 Pt in bathroom and cleaning self. EDT [place] brought pt clothes for pt to change into. Pt bed linens changed and clean chux placed on bed. Pt provided with warm blankets. Pt informed that breakfast had arrived and tray set up.

## 2024-01-15 NOTE — ED Notes (Signed)
 Pt eating breakfast at this time. Try set up by EDT Audelia Acton

## 2024-01-15 NOTE — ED Notes (Addendum)
 Pt dinner tray left at bedside. Pt currently asleep.

## 2024-01-15 NOTE — ED Notes (Signed)
 Hospital meal provided, pt tolerated w/o complaints.  Waste discarded appropriately.

## 2024-01-15 NOTE — ED Notes (Signed)
 MD Vicente Males read PPD and states it is measuring at 1.5 and it good.

## 2024-01-15 NOTE — ED Notes (Signed)
 Pt urinated in a cup and threw it in the trash can causing urine to get all over the floor. Pt given urinal and instructed to use it if he can't make it to the restroom. Pt verbalized understanding.

## 2024-01-15 NOTE — ED Provider Notes (Signed)
 Emergency Medicine Observation Re-evaluation Note  Alexander Avery is a 68 y.o. male, seen on rounds today.  Pt initially presented to the ED for complaints of Fall  Currently, the patient is resting comfortably.  Physical Exam  BP (!) 148/65   Pulse 68   Temp 98 F (36.7 C)   Resp 18   Ht 5\' 6"  (1.676 m)   Wt 56.7 kg   SpO2 98%   BMI 20.18 kg/m  General: No acute distress Cardiac: Well-perfused extremities Lungs: No respiratory distress Psych: Appropriate mood and affect  ED Course / MDM  EKG:   I have reviewed the labs performed to date as well as medications administered while in observation.  Recent changes in the last 24 hours include none.  Plan  Current plan is for placement.   Merwyn Katos, MD 01/15/24 [phone]

## 2024-01-16 DIAGNOSIS — S0990XA Unspecified injury of head, initial encounter: Secondary | ICD-10-CM | POA: Diagnosis not present

## 2024-01-16 LAB — RESP PANEL BY RT-PCR (RSV, FLU A&B, COVID)  RVPGX2
Influenza A by PCR: NEGATIVE
Influenza B by PCR: NEGATIVE
Resp Syncytial Virus by PCR: NEGATIVE
SARS Coronavirus 2 by RT PCR: NEGATIVE

## 2024-01-16 MED ORDER — AMLODIPINE BESYLATE 5 MG PO TABS: 5.0000 mg | ORAL_TABLET | Freq: Every morning | ORAL | 0 refills | Status: DC

## 2024-01-16 NOTE — ED Notes (Signed)
 Pt ambulated to BR with walker, and back to bed no incident. Gave decaf coffee. Pt helped with bank phone call, dialed number.

## 2024-01-16 NOTE — ED Notes (Signed)
Pt given snack at this time  

## 2024-01-16 NOTE — ED Notes (Signed)
vol/toc placement.. 

## 2024-01-16 NOTE — TOC Progression Note (Signed)
 Transition of Care [facility]) - Progression Note    Patient Details  Name: Alexander Avery MRN: [id number] Date of Birth: 1956/10/08  Transition of Care [facility]) CM/SW Contact  Margarito Liner, LCSW Phone Number: 01/16/2024, 11:23 AM  Clinical Narrative:  CSW sent COVID and TB results to [place] at [facility] in a secure email.   Expected Discharge Plan and Services                                               Social Determinants of Health (SDOH) Interventions SDOH Screenings   Alcohol Screen: High Risk (11/26/2021)  Tobacco Use: High Risk (01/03/2024)    Readmission Risk Interventions     No data to display

## 2024-01-16 NOTE — NC FL2 (Signed)
 [place] MEDICAID FL2 LEVEL OF CARE FORM     IDENTIFICATION  Patient Name: Alexander Avery Birthdate: 01/28/1956 Sex: male Admission Date (Current Location): 01/03/2024  [facility] and [place] Number:  Chiropodist and Address:  [facility], [address] [place], [place] [address]      Provider Number: [id number]  Attending Physician Name and Address:  Phineas Semen, MD  Relative Name and Phone Number:  [name] [phone]    Current Level of Care: Hospital Recommended Level of Care: Family Care Home Prior Approval Number:    Date Approved/Denied:   PASRR Number:   Discharge Plan: Other (Comment) (Family Care Home)    Current Diagnoses: Patient Active Problem List   Diagnosis Date Noted   Other specified anemias 11/21/2023   Gastrointestinal hemorrhage 10/04/2023   Rib pain on right side 08/30/2023   Physical debility 06/03/2023   Bilateral leg edema 02/26/2023   Depression with anxiety 02/26/2023   Chronic diastolic CHF (congestive heart failure) (HCC) 02/26/2023   Protein-calorie malnutrition, moderate (HCC) 02/26/2023   Tobacco abuse 02/26/2023   Fall at home, initial encounter 02/26/2023   Near syncope 02/20/2023   Primary hypertension 02/20/2023   Leg swelling 02/20/2023   Cocaine abuse with cocaine-induced mood disorder (HCC) 04/18/2022   Opiate withdrawal (HCC) 11/28/2021   Severe episode of recurrent major depressive disorder, without psychotic features (HCC) 11/26/2021   Polysubstance (including opioids) dependence, daily use (HCC) 11/26/2021   Polysubstance abuse (HCC) 04/18/2020   Prostate cancer (HCC) 02/20/2019    Orientation RESPIRATION BLADDER Height & Weight     Self, Time, Situation, Place  Normal Continent Weight: 125 lb (56.7 kg) Height:  5\' 6"  (167.6 cm)  BEHAVIORAL SYMPTOMS/MOOD NEUROLOGICAL BOWEL NUTRITION STATUS   (None)  (None) Continent Diet (Regular)  AMBULATORY STATUS COMMUNICATION OF NEEDS  Skin   Supervision Verbally Normal                       Personal Care Assistance Level of Assistance  Bathing, Feeding, Dressing Bathing Assistance: Limited assistance Feeding assistance: Independent Dressing Assistance: Limited assistance     Functional Limitations Info  Sight, Hearing, Speech Sight Info: Adequate Hearing Info: Adequate Speech Info: Adequate    SPECIAL CARE FACTORS FREQUENCY                  Contractures Contractures Info: Not present    Additional Factors Info  Code Status, Allergies Code Status Info: Full code Allergies Info: Penicillins           Current Medications (01/16/2024):  This is the current hospital active medication list Current Facility-Administered Medications  Medication Dose Route Frequency Provider Last Rate Last Admin   alum & mag hydroxide-simeth (MAALOX/MYLANTA) 200-200-20 MG/5ML suspension 30 mL  30 mL Oral Q4H PRN Phineas Semen, MD   30 mL at 01/16/24 0908   amLODipine (NORVASC) tablet 5 mg  5 mg Oral q morning Willy Eddy, MD   5 mg at 01/16/24 2595   buprenorphine-naloxone (SUBOXONE) 8-2 mg per SL tablet 1 tablet  1 tablet Sublingual BID Willy Eddy, MD   1 tablet at 01/16/24 6387   ibuprofen (ADVIL) tablet 400 mg  400 mg Oral Q6H PRN Minna Antis, MD   400 mg at 01/14/24 2120   polyethylene glycol (MIRALAX / GLYCOLAX) packet 17 g  17 g Oral Daily Sharman Cheek, MD   17 g at 01/16/24 0908   Current Outpatient Medications  Medication Sig  Dispense Refill   SUBOXONE 8-2 MG FILM Place under the tongue 2 (two) times daily.     amLODipine (NORVASC) 5 MG tablet Take 1 tablet (5 mg total) by mouth every morning. 90 tablet 0     Discharge Medications: TAKE these medications TAKE these medications  amLODipine 5 MG tablet Commonly known as: NORVASC Take 1 tablet (5 mg total) by mouth every morning.   ASK your doctor about these medications ASK your doctor about these medications  Suboxone 8-2 MG  Film Generic drug: Buprenorphine HCl-Naloxone HCl Place under the tongue 2 (two) times daily.    Relevant Imaging Results:  Relevant Lab Results:   Additional Information SS#: [id number]  Margarito Liner, LCSW

## 2024-01-16 NOTE — ED Notes (Signed)
 Patient up ambulatory with roller walker occasionally. No distress noted and no incidents with activity. Snacks/fluids provided per request.

## 2024-01-16 NOTE — ED Provider Notes (Signed)
 Emergency Medicine Observation Re-evaluation Note  Alexander Avery is a 68 y.o. male, seen on rounds today.  Pt initially presented to the ED for complaints of Fall  Currently, the patient is no acute distress. Resting   Physical Exam  Blood pressure (!) 165/80, pulse 74, temperature 98.1 F (36.7 C), resp. rate 16, height 5\' 6"  (1.676 m), weight 56.7 kg, SpO2 99%.  Physical Exam General: No apparent distress Pulm: Normal WOB Psych: resting     ED Course / MDM   Clinical Course as of 01/16/24 0459  Tue Jan 03, 2024  2149 Workup otherwise reassuring.  Patient sleeping in the hallway.  Do not see indication for medical admission at this time.  Will consult TOC placed in for status. [PR]    Clinical Course User Index [PR] Willy Eddy, MD    I have reviewed the labs performed to date as well as medications administered while in observation.  Recent changes in the last 24 hours include   Plan   Current plan is to continue to wait for  sw  Patient is not under full IVC at this time.   Concha Se, MD 01/16/24 [phone]

## 2024-01-16 NOTE — ED Notes (Signed)
 Pt ambulated to with walker, and back to bed no incident.

## 2024-01-16 NOTE — Progress Notes (Signed)
 Physical Therapy Treatment Patient Details Name: Alexander Avery MRN: [id number] DOB: 1956/03/10 Today's Date: 01/16/2024   History of Present Illness Alfonso D Slinger is a 68 y.o. male who presents to the ER for homelessness.  He was brought in by DSS because he is reportedly evicted today.  He fell the day before coming to the ED and reports 8-12 falls in the last month. He was admitted with diagnoses of frequent falls and homelessness.    PT Comments  Pt has progressed and is ambulating independently on unit.  Enjoys walking so session focused on longer walking outside of behavioral med area.  No LOB noted.  Navigates with rollator well.   If plan is discharge home, recommend the following: A little help with walking and/or transfers;Assistance with cooking/housework;Assist for transportation;Help with stairs or ramp for entrance;A little help with bathing/dressing/bathroom   Can travel by private vehicle        Equipment Recommendations  None recommended by PT    Recommendations for Other Services       Precautions / Restrictions Precautions Precautions: Fall     Mobility  Bed Mobility Overal bed mobility: Modified Independent               Patient Response: Cooperative  Transfers Overall transfer level: Modified independent                      Ambulation/Gait Ambulation/Gait assistance: Modified independent (Device/Increase time), Supervision Gait Distance (Feet): 1000 Feet Assistive device: Rollator (4 wheels) Gait Pattern/deviations: Step-through pattern, Shuffle, Decreased step length - left, Decreased step length - right, Trunk flexed Gait velocity: dec         Stairs             Wheelchair Mobility     Tilt Bed Tilt Bed Patient Response: Cooperative  Modified Rankin (Stroke Patients Only)       Balance Overall balance assessment: Mild deficits observed, not formally tested                                           Communication    Cognition Arousal: Alert Behavior During Therapy: WFL for tasks assessed/performed   PT - Cognitive impairments: No family/caregiver present to determine baseline                                Cueing    Exercises      General Comments        Pertinent Vitals/Pain Pain Assessment Pain Assessment: No/denies pain    Home Living                          Prior Function            PT Goals (current goals can now be found in the care plan section) Progress towards PT goals: Progressing toward goals    Frequency    Min 1X/week      PT Plan      Co-evaluation              AM-PAC PT "6 Clicks" Mobility   Outcome Measure  Help needed turning from your back to your side while in a flat bed without using bedrails?: None Help needed moving from lying on your back to  sitting on the side of a flat bed without using bedrails?: None Help needed moving to and from a bed to a chair (including a wheelchair)?: None Help needed standing up from a chair using your arms (e.g., wheelchair or bedside chair)?: None Help needed to walk in hospital room?: None Help needed climbing 3-5 steps with a railing? : A Little 6 Click Score: 23    End of Session   Activity Tolerance: Patient tolerated treatment well Patient left: in bed;with nursing/sitter in room Nurse Communication: Mobility status PT Visit Diagnosis: History of falling (Z91.81);Muscle weakness (generalized) (M62.81);Other abnormalities of gait and mobility (R26.89)     Time: 1302-1310 PT Time Calculation (min) (ACUTE ONLY): 8 min  Charges:    $Gait Training: 8-22 mins PT General Charges $$ ACUTE PT VISIT: 1 Visit                   Danielle Dess, PTA 01/16/24, 1:16 PM

## 2024-01-16 NOTE — ED Notes (Signed)
 Pt ambulating with Physical therapy.

## 2024-01-17 DIAGNOSIS — S0990XA Unspecified injury of head, initial encounter: Secondary | ICD-10-CM | POA: Diagnosis not present

## 2024-01-17 MED ORDER — SUBOXONE 8-2 MG SL FILM: 1.0000 | ORAL_FILM | Freq: Two times a day (BID) | SUBLINGUAL | 0 refills | Status: AC

## 2024-01-17 NOTE — TOC Progression Note (Addendum)
 Transition of Care [facility]) - Progression Note    Patient Details  Name: PASQUALINO WITHERSPOON MRN: [id number] Date of Birth: [birth year]  Transition [facility] Care [facility]) CM/SW Contact  Truddie Hidden, RN Phone Number: 01/17/2024, 3:21 PM  Clinical Narrative:    [name] with Bridget/Owner  from [facility].  She is awaiting phone call from MD regarding an alternative medication or solution for Suboxone while arranging his appointment. Clarisse Gouge anticipates it will take approximately 2-3 weeks before the patient can be seen. MD notified.   3:46pm Retrieved call from [place] at [facility] inquired about if patient will be discharged today. She was advised, at her previous request a Suboxone alternative was still being discussed. Clarisse Gouge stated "Forget about the Suboxone." Let me know if he is coming.    3:30pm Dr. Vicente Males called medication to [facility] in [place].   4:22pm  Per [employer] tranport is [phone]. Contacted Ken, Legal Guardian. [facility] stated he was not able to approve that amount for patient. He contacted his supervisor who stated patient gets SSI. They are unable to pay upfront for transport. [facility] [employer] arranged via hospital as payor. Briget from [facility] advised patient is being transport within the next 25 minutes. [employer] provided ER contact [phone].   TOC signing off.            Expected Discharge Plan and Services                                               Social Determinants of Health (SDOH) Interventions SDOH Screenings   Alcohol Screen: High Risk (11/26/2021)  Tobacco Use: High Risk (01/03/2024)    Readmission Risk Interventions     No data to display

## 2024-01-17 NOTE — ED Notes (Signed)
 Patient ambulatory to restroom and back to bed via roller walker without incident.

## 2024-01-17 NOTE — ED Provider Notes (Signed)
 Emergency Medicine Observation Re-evaluation Note  Alexander Avery is a 68 y.o. male, seen on rounds today.  Pt initially presented to the ED for complaints of Fall  Currently, the patient is resting comfortably.  Physical Exam  BP (!) 146/65 (BP Location: Right Arm)   Pulse 71   Temp 98.2 F (36.8 C) (Oral)   Resp 18   Ht 5\' 6"  (1.676 m)   Wt 56.7 kg   SpO2 100%   BMI 20.18 kg/m  General: No acute distress Cardiac: Well-perfused extremities Lungs: No respiratory distress Psych: Appropriate mood and affect  ED Course / MDM  EKG:   I have reviewed the labs performed to date as well as medications administered while in observation.  Recent changes in the last 24 hours include none.  Plan  [facility].  Patient received a 2-week prescription for Suboxone that patient had been taking in the outpatient setting.  Patient will need to follow-up with a Suboxone clinic within that time to provide further prescriptions.   Merwyn Katos, MD 01/17/24 [phone]

## 2024-01-17 NOTE — TOC Progression Note (Signed)
 Transition of Care [facility]) - Progression Note    Patient Details  Name: Alexander Avery MRN: [id number] Date of Birth: 11-Oct-1956  Transition of Care [facility]) CM/SW Contact  Margarito Liner, LCSW Phone Number: 01/17/2024, 8:24 AM  Clinical Narrative:   CSW emailed AVS and FL2 to group home owner. In email, CSW asked her to provide preferred transport time and report number for RN.  Expected Discharge Plan and Services                                               Social Determinants of Health (SDOH) Interventions SDOH Screenings   Alcohol Screen: High Risk (11/26/2021)  Tobacco Use: High Risk (01/03/2024)    Readmission Risk Interventions     No data to display

## 2024-01-17 NOTE — ED Notes (Signed)
Provided warm blanket.

## 2024-01-17 NOTE — [place] FL2 (Addendum)
 [place] MEDICAID FL2 LEVEL OF CARE FORM     IDENTIFICATION  Patient Name: Alexander Avery Birthdate: 12-Mar-1956 Sex: male Admission Date (Current Location): 01/03/2024  [facility] and [place] Number:  Chiropodist and Address:  [facility], [address] [place], [place] [address]      Provider Number: [id number]  Attending Physician Name and Address:  Donna Bernard, MD  Relative Name and Phone Number:  [name] [phone]    Current Level of Care: Hospital Recommended Level of Care: Family Care Home Prior Approval Number:    Date Approved/Denied:   PASRR Number:   Discharge Plan: Other (Comment) (Family Care Home)    Current Diagnoses: Patient Active Problem List   Diagnosis Date Noted   Other specified anemias 11/21/2023   Gastrointestinal hemorrhage 10/04/2023   Rib pain on right side 08/30/2023   Physical debility 06/03/2023   Bilateral leg edema 02/26/2023   Depression with anxiety 02/26/2023   Chronic diastolic CHF (congestive heart failure) (HCC) 02/26/2023   Protein-calorie malnutrition, moderate (HCC) 02/26/2023   Tobacco abuse 02/26/2023   Fall at home, initial encounter 02/26/2023   Near syncope 02/20/2023   Primary hypertension 02/20/2023   Leg swelling 02/20/2023   Cocaine abuse with cocaine-induced mood disorder (HCC) 04/18/2022   Opiate withdrawal (HCC) 11/28/2021   Severe episode of recurrent major depressive disorder, without psychotic features (HCC) 11/26/2021   Polysubstance (including opioids) dependence, daily use (HCC) 11/26/2021   Polysubstance abuse (HCC) 04/18/2020   Prostate cancer (HCC) 02/20/2019    Orientation RESPIRATION BLADDER Height & Weight     Self, Time, Situation, Place  Normal Continent Weight: 125 lb (56.7 kg) Height:  5\' 6"  (167.6 cm)  BEHAVIORAL SYMPTOMS/MOOD NEUROLOGICAL BOWEL NUTRITION STATUS   (None)  (None) Continent Diet (Regular)  AMBULATORY STATUS COMMUNICATION OF NEEDS  Skin   Supervision Verbally Normal                       Personal Care Assistance Level of Assistance  Bathing, Feeding, Dressing Bathing Assistance: Limited assistance Feeding assistance: Independent Dressing Assistance: Limited assistance     Functional Limitations Info  Sight, Hearing, Speech Sight Info: Adequate Hearing Info: Adequate Speech Info: Adequate    SPECIAL CARE FACTORS FREQUENCY                  Contractures Contractures Info: Not present    Additional Factors Info  Code Status, Allergies Code Status Info: Full code Allergies Info: Penicillins           Current Medications (01/17/2024):  This is the current hospital active medication list Current Facility-Administered Medications  Medication Dose Route Frequency Provider Last Rate Last Admin   alum & mag hydroxide-simeth (MAALOX/MYLANTA) 200-200-20 MG/5ML suspension 30 mL  30 mL Oral Q4H PRN Phineas Semen, MD   30 mL at 01/17/24 0809   amLODipine (NORVASC) tablet 5 mg  5 mg Oral q morning Willy Eddy, MD   5 mg at 01/17/24 1115   buprenorphine-naloxone (SUBOXONE) 8-2 mg per SL tablet 1 tablet  1 tablet Sublingual BID Willy Eddy, MD   1 tablet at 01/17/24 1115   ibuprofen (ADVIL) tablet 400 mg  400 mg Oral Q6H PRN Minna Antis, MD   400 mg at 01/14/24 2120   polyethylene glycol (MIRALAX / GLYCOLAX) packet 17 g  17 g Oral Daily Sharman Cheek, MD   17 g at 01/17/24 1115   Current Outpatient Medications  Medication Sig  Dispense Refill   amLODipine (NORVASC) 5 MG tablet Take 1 tablet (5 mg total) by mouth every morning. 90 tablet 0   SUBOXONE 8-2 MG FILM Place 1 Film under the tongue 2 (two) times daily for 14 days. 28 each 0     Discharge Medications: amLODipine 5 MG tablet Commonly known as: NORVASC Take 1 tablet (5 mg total) by mouth every morning.    Suboxone 8-2 MG Film Generic drug: Buprenorphine HCl-Naloxone HCl Place 1 Film under the tongue 2 (two) times daily for  14 days. What changed: how much to take    Relevant Imaging Results:  Relevant Lab Results:   Additional Information SS#: [id number]  Margarito Liner, LCSW

## 2024-03-20 ENCOUNTER — Telehealth: Payer: Self-pay | Admitting: Urology

## 2024-03-20 NOTE — Telephone Encounter (Signed)
 He has not been seen in over one year for follow up.  He has a history of prostate cancer and a rising PSA.  Please schedule him at my next available.

## 2024-03-28 ENCOUNTER — Ambulatory Visit: Admitting: Urology

## 2024-07-17 NOTE — Consults (Signed)
 Division of Infectious Diseases General Inpatient Consultation Service   For questions about this consult, page [phone] (Gen B Follow-up Pager).   Alexander Avery is being seen in consultation at the request of Jeremiah L Deneve, DO for evaluation and management of Clostridium septicum bacteremia.    PLAN FOR 07/17/2024  Diagnostic Follow up Achromobacter sensitivities - may be a few more days. Monitor for antimicrobial toxicity with the following: CBC w/diff at least once per week CMP at least once per week clinical assessments for rashes or other skin changes clinical assessments for CNS signs or symptoms (drug-induced neurotoxicity)  Treatment CONTINUE metronidazole for Clostridium septicum bacteremia for 2 weeks CONTINUE Bactrim  for Achromobacter UTI pending susceptibilities CONTINUE linezolid 600 mg PO bid Would consult Urology for PCN exchange to accelerate clearance of MDR infection  Duration of therapy = 2 weeks start date = TBD, date of PCN exchange end date = TBD  I discussed the plans for today with primary team and patient on 07/17/2024.  Our service will continue to follow.  I personally spent 40 mins face-to-face and non-face-to-face in the care of this patient on 07/17/2024, which includes all pre, intra, and post visit time on the date of service.  All documented time was specific to the E/M visit and does not include any procedures that may have been performed.  Care for a suspected or confirmed infection was provided by an ID specialist in this encounter. [phone])   Laneta CHRISTELLA Sink, MD [facility] Division of Infectious Diseases         MDM and Problem-Specific Assessments  ( .00ID2DAY  /  .00IDADDLPROB  /  .IDSS  / .BUENA )   See HPI and problem list below.  Patient has: [x]  acute illness w/systemic sxs  [mod] [x]  illness posing risk to life or function  [high]  I reviewed:  (3+) [x]  primary team note [x]  consultant note(s) []  procedure/op  note(s) [x]  micro result(s)   [x]  CBC results [x]  chemistry results [x]  radiology report(s) []  w/indep. historian  I independently visualized:  (any)   []  cxs/plates in lab []  plain film images []  CT images []  PET images   []  path slide(s) []  ECG tracing []  MRI images []  nuclear scan  I discussed: (any) [x]  micro and/or path w/lab personnel [x]  drug options and/or interactions w/ID pharmD   []  procedure/OR findings w/other MD(s) []  echo and/or imaging w/other MD(s)   []  mgm't w/attending(s) involved in case []  setting up home abx w/OPAT team  Mgm't requires: [x]  prescription drug(s)  [mod] [x]  intensive toxicity monitoring  [high]   # Clostridium septicum bacteremia - acute, poses threat to life or bodily function  [high] Clostridium septicum bacteremia likely due to GI cancer and altered gut integrity.  Metronidazole for 2 weeks If septic, consider broadening antibiotics since he is at risk of gut flora translocation, but given current stability, would not empirically add antibiotics.  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 Patient reports that his PCN has not been changed in several months, which in combination with urinary obstruction, likely led to his current complicated UTI. Linezolid is more reliable than doxycycline to treat VRE, so would switch to linezolid bid. Achromobacter is inherently resistant to many antibiotics, but Bactrim  is appropriate empiric therapy, and we can alter drug choice based on susceptibilities when available.  Continue Bactrim  to treat Achromobacter, though may have to alter treatment based on susceptibilities. Discussed  with the micro lab, who says this can take up to a week, but that Bactrim  is a good empiric choice based on prior isolates at this hospital.  Would switch from doxycycline to linezolid for VRE treatment, as linezolid is the recommended treatment for VRE infections and likely achieves better  levels in the urine than doxycycline. Candida from cath likely represents colonization, and since it did not grown on PCN cultures, would not treat. Can stop fluconazole.   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   # Management of prescription antimicrobials needing intensive toxicity monitoring - acute, poses threat to life or bodily function  [high] Linezolid can cause optic or peripheral neuropathies, myelosuppression, and/or N/V/D.  Metronidazole can cause rashes, dysgeusia (altered taste), peripheral neuropathy, and/or myelosuppression.  TMP/SMX can cause hyperkalemia, various dermatological effects, nephrotoxicity, and/or myelosuppression.  See recommendations in blue box above.  # Disposition Lives in a SNF and will discharge there when ready. Likely can go on an oral regimen, unless Achromobacter requires IV antibiotic (susceptibilities pending)    DATA, FINDINGS, & RESULTS GUIDING DECISION-MAKING FOR PROBLEMS ABOVE  8/27 blood cultures 1/2 with Clostridium septicum 8/27 urine cultures with Achromobacter and VRE UA dirty Fever on 8/27, defeveresced with antibiotics           Antimicrobials & Other Medications  ( .00IDGANTT  /  .00IDGANTTLIST  )   Current Metronidazole 8/27-8/30, 8/31- Bactrim  8/31- Linezolid 9/1- Fluconazole 8/30-  Previous Doxycycline 8/30-9/1  Cefepime 8/27-8/30 Vancomycin 8/27-8/30 Ceftriaxone 8/19 x1  Immunomodulators and antipyretics none   Current Medications as of 07/17/2024 Scheduled  PRN  amlodipine , 5 mg, Daily buprenorphine -naloxone , 1 Film, BID enoxaparin  (LOVENOX ) injection, 30 mg, Q24H lidocaine , 1 patch, Daily lidocaine , 2 patch, Daily linezolid, 600 mg, Q12H SCH metroNIDAZOLE, 500 mg, TID multivitamins (ADULT), 1 tablet,  Daily pantoprazole, 40 mg, Daily sulfamethoxazole -trimethoprim , 1 tablet, Q12H SCH    acetaminophen , 650 mg, Q6H PRN hydrALAZINE , 10 mg, Q6H PRN labetalol, 20 mg, Q6H PRN methocarbamol , 500 mg, Q6H PRN nicotine  polacrilex, 4 mg, Q1H PRN nicotine  polacrilex, 4 mg, Q1H PRN ondansetron , 4 mg, Q6H PRN simethicone , 80 mg, Q6H PRN      Physical Exam   Temp:  [36.7 C (98.1 F)-36.8 C (98.2 F)] 36.7 C (98.1 F) Pulse:  [61-68] 68 Resp:  [16-18] 16 BP: (128-192)/(63-85) 192/83 MAP (mmHg):  [84-116] 116 SpO2:  [96 %-100 %] 100 %  Actual body weight: 51.5 kg (113 lb 9.6 oz) Ideal body weight: 60.4 kg (133 lb 0.8 oz)   Const [x]  vital signs above     []  WDWN, NAD, non-toxic appearance [x]  Chronically ill appearing    Eyes  [x]  Lids normal bilaterally, conjunctiva anicteric and noninjected OU []  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    ENMT    [x]  Normal appearance of external nose and ears      [x]  OP clear   []  MMM, no lesions on lips or gums, dentition good       []  Hearing normal  []      Neck   [x]  Neck of normal appearance and trachea midline       []  No thyromegaly, nodules, or tenderness  []      Lymph   []  No LAD in neck      []  No LAD in supraclavicular area      []   No LAD in axillae  []  No LAD in epitrochlear chains      []  No LAD in inguinal areas []      CV   [x]  RRR, no m/r/g, S1/S2      [x]  No peripheral edema, WWP      []  Pedal pulses intact  []      Resp  [x]  Normal WOB      [x]  CTAB  []      GI  [x]  Normal inspection, NTND, NABS      []  No umbilical hernia on exam      [x]  No hepatosplenomegaly      []  Inspection of perineal and perianal areas normal [x]  Ileostomy stoma pink and healthy appearing, tan soft stool in pouch    GU  []  Normal external genitalia      [x]  Clear yellow urine in PCN bag    MSK  [x]  No clubbing or cyanosis of hands      []  No focal tenderness or abnormalities on palpation of joints in RUE, LUE, RLE, or LLE []      Skin  [x]   No rashes, lesions, or ulcers of visualized skin      []  Skin warm and dry to palpation  []      Neuro  [x]  CNs II-XII grossly intact      [x]  Sensation to light touch grossly intact throughout  []  DTRs normal and symmetric throughout  []  Unable to assess due to critical illness, sedation, or mental status [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 02/19/24  1602  Right  148   Ileostomy RLQ 07/03/24  --  RLQ  14   External Urinary Device 07/09/24 Without Suction 07/09/24  1500  -- 8   Peripheral IV 07/16/24 Posterior;Right Forearm 07/16/24  1540  Forearm  less than 1             Data for ID Decision Making  ( IDGENCONMDM )    Micro & Serological Data   ( RSLTMICRO  /  [id number]  /  00CXSRC  /  00CXRES  /  00CXSUSC ) Microbiology Results (last day)     Procedure Component Value Date/Time Date/Time   Aerobic Susceptibility-[facility]o [[id number]] Collected: 07/11/24 1808   Lab Status: In process Specimen: Urine from Nephrostomy, right Updated: 07/17/24 0758   Micro Add-on [[id number]] Collected: 07/11/24 1808   Lab Status: Final result Specimen: Urine from Nephrostomy, right Updated: 07/17/24 0757   Blood Culture, Adult [[id number]]  (Normal) Collected: 07/13/24 1952   Lab Status: Preliminary result Specimen: Blood from 1 Peripheral Draw Updated: 07/16/24 2030    Blood Culture, Routine No Growth at 72 hours   Blood Culture #1 [[id number]]  (Normal) Collected: 07/11/24 1545   Lab Status: Final result Specimen: Blood from 1 Peripheral Draw Updated: 07/16/24 1930    Blood Culture, Routine No Growth at 5 days      8/27 blood: Clostridium septicum 8/27 urine  (PCN): VRE, Achromobacter 8/27 urine (I&O): 10-50K Candida   Susceptibility data from last 180 days. 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      07/11/24 Urine from Catheterized-In and Out Catheter Candida albicans      07/11/24 Blood from 1 Peripheral Draw Clostridium septicum          Recent Studies  ( RISRSLT ) ECG 12 Lead Result Date: 07/12/2024 SINUS RHYTHM ABNORMAL ECG WHEN COMPARED WITH ECG OF 22-Aug-2021 16:12, ST DEPRESSION HAS REPLACED ST ELEVATION IN ANTERIOR LEADS NONSPECIFIC T WAVE ABNORMALITY NO LONGER EVIDENT IN INFERIOR LEADS T WAVE INVERSION NO LONGER EVIDENT IN LATERAL LEADS Confirmed by Antonetta Gull ([id number]) on 07/12/2024 9:51:03 AM  CT Head Wo Contrast Result Date: 07/11/2024 EXAM: Computed tomography, head or brain without contrast material. ACCESSION: [id number] [facility]   CLINICAL INDICATION: 68 years old Male with AMS    COMPARISON: None   TECHNIQUE: Axial CT images of the head from skull base to vertex without contrast.   FINDINGS: Global cerebral atrophy with ex vacuo dilation of the CSF-containing spaces. Chronic lacunar infarcts in the basal ganglia and thalami. Periventricular and deep white matter hypodensities are nonspecific but commonly seen in small vessel ischemic disease.   There is no midline shift. No mass lesion. There is no evidence of large territory acute infarct.  The sinuses are pneumatized. Bilateral lens replacements.   No intracranial hemorrhage or skull fractures.     No acute appearing intracranial abnormality.       XR Abdomen 1 View Result Date: 07/11/2024 EXAM: XR ABDOMEN 1 VIEW ACCESSION: [id number] [facility] REPORT DATE: 07/11/2024 3:50 PM   CLINICAL INDICATION: 67 years old with NAUSEA ALONE    COMPARISON: Abdominal radiograph 06/30/2024   TECHNIQUE: Supine  view of the abdomen, 1 image(s)   FINDINGS: Nonobstructive bowel pattern with enteric contrast visualized throughout the large bowel.   Right lower quadrant ostomy. Unchanged position of right-sided percutaneous nephrostomy tube. Partially visualized lower pelvic surgical clips.   Please see separately dictated concurrent chest radiograph for findings above the diaphragm. Multilevel degenerative changes of the spine.     Nonobstructive bowel gas pattern with enteric contrast visualized throughout the large bowel.     XR Chest Portable Result Date: 07/11/2024 EXAM: XR CHEST PORTABLE ACCESSION: [id number] UN REPORT DATE: 07/11/2024 3:50 PM   CLINICAL INDICATION: FEVER    TECHNIQUE: Single View AP Chest Radiograph.   COMPARISON: CT chest 07/04/2024   FINDINGS:   Bilateral lower lung hazy opacities.  Left costophrenic sulcus is blunted. No pneumothorax.   Cardiac silhouette is within normal limits for size given AP technique.       Bilateral lower lung hazy opacities, left greater than right, which may represent airspace disease versus atelectasis.       CT Chest W Contrast Result Date: 07/04/2024 EXAM: CT CHEST W CONTRAST ACCESSION: [id number] UN REPORT DATE: 07/04/2024 10:16 AM   CLINICAL INDICATION: Cancer staging   TECHNIQUE: Contiguous axial images were reconstructed through the chest following a single breath hold helical acquisition during the administration of intravenous contrast material. Images were reformatted in the axial, coronal, and sagittal planes. MIP slabs were also constructed.   COMPARISON: None.   FINDINGS:   LUNGS/AIRWAYS/PLEURA: Trachea and large airways are patent. Nonobstructive debris in the trachea. Mild centrilobular and paraseptal emphysema. Focal groundglass opacity in the anterior segment of left upper lobe. No visualized suspicious pulmonary nodules in the rest of the lungs. Large bilateral pleural effusions with partial collapse of the  bilateral lower lobes. No pneumothorax  MEDIASTINUM/THORACIC INLET: No enlarged supraclavicular or intrathoracic lymph nodes. No mediastinal mass or thyroid abnormality.   HEART/VASCULATURE: Cardiac chambers are normal in size. Mild coronary artery calcifications. No pericardial effusion. Normal caliber aorta with mild calcifications. Main pulmonary artery is normal in size.   UPPER ABDOMEN: Postsurgical changes to the ventral abdominal soft tissues with multiple foci of intra-abdominal free air. Large volume ascites. Partial visualization of right-sided percutaneous nephrostomy tube with hydronephrosis.   CHEST WALL/BONES: Generalized body wall edema. No enlarged axillary lymph nodes. Degenerative changes to the spine. No suspicious lytic or sclerotic osseous lesions.   DEVICES: None.       1.  Bilateral large pleural effusions with partial collapse of lower lobes, large ascites and generalized body wall edema. These findings may reflect volume overload.   2.  No identified suspicious pulmonary nodules in the non-atelectatic bilateral lung. Consider follow-up chest CT after treatment to better assess of lower lobes.   3.  Focal groundglass opacities in the anterior left upper lobe likely aspiration/infectious.   4.  Postsurgical changes with large size and small pneumoperitoneum.             XR Abdomen 1 View Result Date: 07/01/2024 EXAM: XR ABDOMEN 1 VIEW ACCESSION: [id number] UN REPORT DATE: 06/30/2024 10:37 PM   CLINICAL INDICATION: 68 years old with NAUSEA & VOMITING    COMPARISON: Abdominal radiograph 06/28/2024   TECHNIQUE: Supine view of the abdomen, 3 image(s)   FINDINGS: Redemonstrated gas-filled loops of small bowel in the central abdomen measuring up to 5.3 cm. Enteric contrast opacifies the cecum through the proximal descending colon.   Unchanged position of percutaneous nephrostomy tube overlying the right upper quadrant. Left basilar atelectasis/airspace  disease.     Stable dilated loops of small bowel in keeping with previously reported bowel obstruction.     Flexible Sigmoidoscopy Result Date: 06/29/2024 _______________________________________________________________________________ Patient Name: Alexander Avery         Procedure Date: 06/29/2024 3:48 PM MRN: [id number]                     Date of Birth: 23-Apr-1956 Admit Type: Inpatient                 Age: 29 Room: [facility]          Gender: Male Note Status: Finalized                Instrument Name: [id number] _______________________________________________________________________________  Procedure:             Flexible Sigmoidoscopy Indications:           Abnormal CT of the GI tract Providers:             LISA EARNIE HENLE, MD, BETTY FURY ENGLISH, Serge Naegeli, Technician, ALBERT LAURANCE BRUIN, MD                        (Fellow) Patient Profile:       This is a 68 year old male. Had CT 06/27/24 with                        Centrally necrotic infiltrative right hemipelvis mass  with fistulization with the terminal ileum and                        involvement of the sigmoid colon concerning for                        malignancy. Differential diagnosis includes prostate                        cancer metastasis, primary bowel neoplasm and sarcoma.                        Here for Flex Sig for biopsy, and for EUS if lesion is                        not visualized on Flex Sig. Referring MD:          ALEENE CHANTAL DIN, MD (Referring MD) Medicines:             Monitored Anesthesia Care Complications:         No immediate complications. _______________________________________________________________________________ Procedure:             Pre-Anesthesia Assessment:                        - Prior to the procedure, a History and Physical was                        performed, and patient medications and allergies were                         reviewed. The patient's tolerance of previous                        anesthesia was also reviewed. The risks and benefits                        of the procedure and the sedation options and risks                        were discussed with the patient. All questions were                        answered, and informed consent was obtained. Prior                        Anticoagulants: The patient has taken no anticoagulant                        or antiplatelet agents. ASA Grade Assessment: III - A                        patient with severe systemic disease. After reviewing                        the risks and benefits, the patient was deemed in                        satisfactory condition to undergo the procedure.  After obtaining informed consent, the scope was passed                        under direct vision. The Endoscope was introduced                        through the anus and advanced to the the descending                        colon. The flexible sigmoidoscopy was accomplished                        without difficulty. The quality of the bowel                        preparation was poor.                                                                                Findings:      Copious quantities of stool was found in the recto-sigmoid colon,      interfering with visualization.      An infiltrative non-obstructing large mass was found in the      recto-sigmoid colon (15-20 cm from verge). The mass was partially      circumferential (involving one-third of the lumen circumference). The      mass measured five cm in length. Oozing was present. Biopsies were taken      with a cold forceps for histology.      External and internal hemorrhoids were found during retroflexion and      during perianal exam. The hemorrhoids were Grade III (internal      hemorrhoids that prolapse but require manual reduction).                                                                                 Estimated Blood Loss:      Estimated blood loss was minimal. Impression:            - Stool in the recto-sigmoid colon.                        - Likely malignant tumor in the recto-sigmoid colon.                        Differential includes primary CRC versus recurrent                        prostate cancer. Biopsied.                        - External and internal hemorrhoids. Recommendation:        -  Return patient to hospital ward for ongoing care.                        - Continue present medications.                        - Await pathology results.                        - Advance diet as tolerated once partial SBO resolved.                        -Further plans per primary surgical oncology service.                                                                                Procedure Code(s):     --- Professional ---                        [phone], Sigmoidoscopy, flexible; with biopsy, single or                        multiple Diagnosis Code(s):     --- Professional ---                        K64.2, Third degree hemorrhoids                        D49.0, Neoplasm of unspecified behavior of digestive                        system                        R93.3, Abnormal findings on diagnostic imaging of                        other parts of digestive tract   CPT copyright 2023 [employer]. All rights reserved.   The codes documented in this report are preliminary and upon coder review may be revised to meet current compliance requirements. Attending Participation:      I was present and participated during the entire procedure, including      non-key portions.                                                                                  Electronically Signed  by Olam HERO. Enos, MD ________________________ OLAM EARNIE ENOS, MD 06/29/2024 4:43:42 PM The attending physician was present throughout the entire procedure including the insertion, viewing, and removal of the endoscope. This  procedure note has been electronically signed by: OLAM ENOS , MD  ____________________________ CLEM LAURANCE BRUIN, MD Number of Addenda: 0   Note Initiated On: 06/29/2024 3:48 PM  XR Abdomen 1 View Result Date: 06/29/2024 EXAM: XR ABDOMEN 1 VIEW ACCESSION: [id number] UN REPORT DATE: 06/29/2024 8:48 AM   CLINICAL INDICATION: 68 years old with NGT (CATHETER VASCULAR FIT & ADJ)    COMPARISON: Earlier same day KUB and CT abdomen pelvis 06/24/2024   TECHNIQUE: Semiupright view of the abdomen, 1 image(s)   FINDINGS: Esophagogastric tube with tip and proximal sideport overlying the left upper quadrant the abdomen. Right percutaneous nephrostomy tube. Dilated loops of gas-filled small bowel in the upper abdomen measuring up to 5.3 cm in diameter. Enteric contrast opacifies the partially imaged ascending colon through the descending colon. Multilevel degenerative changes of the spine. Left basilar atelectasis.     1.  Appropriately positioned esophagogastric tube. 2.  Dilated loops of small bowel in the central abdomen, increased compared to prior consistent with known bowel obstruction.     XR Abdomen 1 View Result Date: 06/28/2024 EXAM: XR ABDOMEN 1 VIEW ACCESSION: [id number] UN REPORT DATE: 06/28/2024 12:55 PM   CLINICAL INDICATION: 68 years old with CONSTIPATION    COMPARISON: Radiograph 06/26/2024.   TECHNIQUE: Supine view of the abdomen, 2 image(s)   FINDINGS: Percutaneous nephrostomy tube projects over the right abdomen. Dilated loops of small bowel measuring up to 4.8 cm. Colon is normal caliber with moderate stool and high density material, similar. Pelvic surgical clips. Left basilar atelectasis. No acute osseous abnormality.       1.  Dilated loops of small bowel, concerning for small bowel obstruction. 2.  Moderate colonic stool.     MRI Pelvis W Wo Contrast Result Date: 06/27/2024 EXAM: MRI pelvis with and without contrast ACCESSION: [id number] [facility]   CLINICAL  INDICATION: 68 years old with pelvic mass   COMPARISON: Outside CT abdomen pelvis 06/24/2024   TECHNIQUE: MRI of the pelvis was obtained with and without IV contrast. Multisequence, multiplanar images were obtained.   CONTRAST: 5 mL of Gadopiclenol   FINDINGS: BLADDER: Unremarkable.   PELVIC/REPRODUCTIVE ORGANS: Postsurgical changes from prostatectomy.   In the right hemipelvis is a predominantly extraperitoneal centrally necrotic T2 intermediate to hyperintense mass with lobulated margins and peripheral enhancement measuring 6.4 x 3.7 cm (7:13 and 18:34). There is no significant enhancement within the mass. The mass also demonstrates central restricted diffusion. Question fistulization to the adjacent terminal ileum (7:16-17, 4:35). Probable invasion of the sigmoid colon (series 7, images 12 and 16). The mass encases and narrows the right external iliac vein.   GI TRACT: Multiple stacked and dilated loops of small bowel. The rectum and visualized colon are decompressed.   PERITONEUM/RETROPERITONEUM AND MESENTERY: Small volume free fluid in the abdomen.   LYMPH NODES: No enlarged lymph nodes.   VESSELS: Pelvic vasculature is patent.   BONES AND SOFT TISSUES: No focal osseous lesion.     1.  Centrally necrotic infiltrative right hemipelvis mass with fistulization with the terminal ileum and involvement of the sigmoid colon concerning for malignancy. Differential diagnosis includes prostate cancer metastasis, primary bowel neoplasm and sarcoma. 2.  Multiple dilated and stacked loops of small bowel concerning for small bowel obstruction.  XR Abdomen 1 View Result Date: 06/26/2024 EXAM: XR ABDOMEN 1 VIEW ACCESSION: [id number] UN REPORT DATE: 06/26/2024 8:11 AM   CLINICAL INDICATION: 68 years old with ABDOMINAL PAIN   -  RIGHT LOWER QUADRANT    COMPARISON: CT abdomen pelvis 06/24/2024   TECHNIQUE: Supine view of the abdomen, 2 image(s)   FINDINGS:  Right-sided percutaneous  nephrostomy tube. Mild to moderately dilated a few loops of small bowel are identified at the lower mid abdomen. Nonspecific subtle distention of the stomach with gas. Nondilated colonic loops with high density moderate volume of solid colonic stool to the level of rectum. Unremarkable lung bases. Multilevel degenerative change of the spine. Surgical clips overlie the lower midline pelvis. Dense vascular calcifications.     Mild to moderately dilated a few loops of small bowel are identified at the lower mid abdomen. Obstruction is in the differential. Please correlate with the findings of recent CT. Nondilated colonic loops with high density moderate volume of solid colonic stool to the level of rectum.  CT BODY INTERPRETATION OF OUTSIDE FILM [facility]) Result Date: 06/26/2024 EXAM: CT BODY INTERPRETATION OF OUTSIDE FILM [facility]) ACCESSION: [id number] UN REPORT DATE: 06/26/2024 8:15 AM   CLINICAL INDICATION: R19.00 - Pelvic mass    COMPARISON: CT ABDOMEN PELVIS WO CONTRAST 05/10/2024   TECHNIQUE: A request was received from PHILIP MERLE SPANHEIMER to interpret a CT of the abdomen and pelvis performed at [facility] on 06/24/2024. Images consist of contrast-enhanced axial images. Images were loaded into the [facility] PACS.   FINDINGS: Limited mesenteric fat and artifact limiting classification with scan.   LOWER THORAX: Bibasilar mild atelectasis. Small tree-in-bud nodular opacities identified within the left lung base, concerning for infection/aspiration.   HEPATOBILIARY: No focal hepatic lesions. Hypoattenuating perihepatic collection. Gallbladder distended, with mild wall thickening. Layering present in the gallbladder body consistent with non-radiopaque biliary sludge. No frank cholelithiasis appreciated. Mild diffuse biliary dilatation.    SPLEEN: Normal in size and contour.   PANCREAS: Thin parenchyma. Ductal dilation diffusely in the pancreas, measuring up to 6 mm. No visualized stone  or mass.   ADRENALS: Poorly visualized but unremarkable. KIDNEYS/URETERS: Right percutaneous nephrostomy tube coiled within the proximal ureter. Right kidney atrophic. No hydroureteronephrosis. No nephroureteral calculus. No suspicious renal lesions. BLADDER: Unremarkable. PELVIC ORGANS: Status post radical prostatectomy. Rim-enhancing mass identified within the deep pelvis anterior to the right sacroiliac joint measuring 5.6 x 4.4 x 3.5 cm (2:117). Possible fistulization to the adjacent bowel, limited in evaluation secondary to lack of oral contrast at the site. GI TRACT: Stomach is distended with internal fluid. No evidence of high-grade bowel obstruction identified. Scattered loops of small bowel measuring up to 2.7 cm. Small bowel wall thickening identified within the deep pelvis (2:131). Ill-defined centrally necrotic and rim-enhancing mass again identified within the right posterior pelvis with possible fistulization to adjacent small bowel loops.   PERITONEUM/RETROPERITONEUM AND MESENTERY: No free air. There is small volume fluid present in the perihepatic, perisplenic and perinephric spaces. Diffuse mesenteric edema present. LYMPH NODES: Visualization difficult given edema and fluid within the abdomen. VESSELS: Normal in caliber. Severe atherosclerosis of the abdominal aorta and its branches into the iliac vessels.   BONES AND SOFT TISSUES: No aggressive osseous lesions. No acute abnormality. Degenerative change identified throughout the spine and pelvis. Few remote lateral rib fractures.     Limited examination secondary to limited oral contrast within bowel.   Ill-defined centrally necrotic and rim-enhancing mass identified within the deep right pelvis with possible fistulization to adjacent small bowel loops. Findings are concerning for recurrent malignancy. Consider further evaluation with repeat examination with oral contrast.   Small volume free fluid within the abdomen.   Tree-in-bud  nodular opacities within the left lower lobe, concerning for infection/aspiration.   Additional chronic and incidental findings as above.   PLEASE NOTE:  Our interpretation  of studies performed at an outside institution is limited by factors including the absence of technical specifics of the image, undisclosed clinical information and the unavailability of the original interpretation.  Specialists at the institution that performed the study may have access to information not available to us  that could make a difference in this interpretation.  We suggest that you obtain the original interpretation from the site where the study was performed.      Subjective:  Initial Consult Documentation from July 16, 2024   Sources of information include: chart review, patient, and treating providers.  HPI  Alexander Avery is a 68 y.o. man with PMH notable for CKD, prostate cancer s/p radiation and prostectomy, chronic right PCN who was transferred from [facility] with pelvic mass and bowel obstruction. 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 Loop ileostomy was performed to relieve distal ileum obstruction. He was febrile on 8/27, and workup revealed dirty UA, urine growing Achromobacter and VRE, and 1/2 blood cultures growing Clostridium septicum. He reports that he has had the right PCN for some time and that it has not been exchanged for 4 months - he would like to have it exchanged. He denies focal pain and feels he is getting better.     Past Medical History  Patient  has a past medical history of Cancer    (CMS-HCC).  Past Surgical History[1]   Meds and Allergies Patient has a current medication list which includes the following prescription(s): acetaminophen , buprenorphine -naloxone , ciprofloxacin hcl, lidocaine ,  and pantoprazole, and the following Facility-Administered Medications: acetaminophen , amlodipine , buprenorphine -naloxone , enoxaparin , hydralazine , labetalol, lidocaine , lidocaine , linezolid, methocarbamol , metronidazole, multivitamins, therapeutic with minerals, nicotine  polacrilex, nicotine  polacrilex, ondansetron , pantoprazole, simethicone , and sulfamethoxazole -trimethoprim .  Allergies: Penicillins  Social History Patient  reports that he has been smoking cigarettes. He has been exposed to tobacco smoke. He has never used smokeless tobacco. He reports that he does not currently use alcohol. He reports current drug use. Drug: Heroin.           [1] Past Surgical History: Procedure Laterality Date  . IR INSERT NEPHROSTOMY TUBE - RIGHT Right 02/19/2024   IR INSERT NEPHROSTOMY TUBE - RIGHT 02/19/2024 Ansel Rick, MD IMG IR REX  . PR COLOSTOMY N/A 07/03/2024   Procedure: COLOSTOMY OR SKIN LEVEL CECOSTOMY;  Surgeon: Madelynn Inge CROME, DO;  Location: OR [facility];  Service: Surgical Oncology  . PR SIGMOIDOSCOPY,BIOPSY N/A 06/29/2024   Procedure: SIGMOIDOSCOPY, FLEXIBLE; WITH BIOPSY, SINGLE OR MULTIPLE;  Surgeon: Enos Olam Jansky, MD;  [facility];  Service: Gastroenterology  . PROSTATE SURGERY

## 2024-07-27 NOTE — Progress Notes (Signed)
 error

## 2024-08-03 ENCOUNTER — Inpatient Hospital Stay

## 2024-08-03 ENCOUNTER — Inpatient Hospital Stay: Admitting: Oncology

## 2024-08-03 NOTE — Progress Notes (Signed)
 New patient scheduler will attempt to get patient rescheduled.

## 2024-08-03 NOTE — Progress Notes (Signed)
 Spoke with sister Charolette, regarding missed appointment today. Per [facility], family did not want consult today. She confirmed stating he was moving to [place] and is entering hospice.

## 2024-08-15 DEATH — deceased
